# Patient Record
Sex: Female | Born: 1940 | Race: White | Hispanic: No | Marital: Married | Smoking: Never smoker
Health system: Southern US, Community
[De-identification: ages and names within clinical notes are randomized; demographics above are authoritative.]

## PROBLEM LIST (undated history)

## (undated) DIAGNOSIS — K219 Gastro-esophageal reflux disease without esophagitis: Secondary | ICD-10-CM

## (undated) DIAGNOSIS — E7800 Pure hypercholesterolemia, unspecified: Secondary | ICD-10-CM

## (undated) DIAGNOSIS — F419 Anxiety disorder, unspecified: Secondary | ICD-10-CM

## (undated) DIAGNOSIS — I10 Essential (primary) hypertension: Secondary | ICD-10-CM

## (undated) HISTORY — PX: CHOLECYSTECTOMY: SHX55

## (undated) HISTORY — PX: OTHER SURGICAL HISTORY: SHX169

## (undated) HISTORY — PX: BACK SURGERY: SHX140

---

## 2001-09-08 ENCOUNTER — Inpatient Hospital Stay (HOSPITAL_COMMUNITY): Admission: EM | Admit: 2001-09-08 | Discharge: 2001-09-09 | Payer: Self-pay | Admitting: Emergency Medicine

## 2001-09-08 ENCOUNTER — Encounter: Payer: Self-pay | Admitting: Emergency Medicine

## 2001-09-09 ENCOUNTER — Encounter: Payer: Self-pay | Admitting: *Deleted

## 2001-10-11 ENCOUNTER — Ambulatory Visit (HOSPITAL_COMMUNITY): Admission: RE | Admit: 2001-10-11 | Discharge: 2001-10-11 | Payer: Self-pay | Admitting: *Deleted

## 2001-10-11 ENCOUNTER — Encounter: Payer: Self-pay | Admitting: *Deleted

## 2002-03-08 ENCOUNTER — Encounter: Admission: RE | Admit: 2002-03-08 | Discharge: 2002-03-08 | Payer: Self-pay | Admitting: *Deleted

## 2002-05-06 ENCOUNTER — Encounter: Admission: RE | Admit: 2002-05-06 | Discharge: 2002-05-06 | Payer: Self-pay | Admitting: *Deleted

## 2002-09-17 ENCOUNTER — Encounter: Admission: RE | Admit: 2002-09-17 | Discharge: 2002-09-17 | Payer: Self-pay | Admitting: *Deleted

## 2002-10-14 ENCOUNTER — Encounter: Admission: RE | Admit: 2002-10-14 | Discharge: 2002-10-14 | Payer: Self-pay | Admitting: *Deleted

## 2003-03-28 ENCOUNTER — Encounter: Admission: RE | Admit: 2003-03-28 | Discharge: 2003-03-28 | Payer: Self-pay | Admitting: *Deleted

## 2004-10-25 ENCOUNTER — Ambulatory Visit: Payer: Self-pay | Admitting: *Deleted

## 2004-11-05 ENCOUNTER — Ambulatory Visit: Payer: Self-pay

## 2004-11-05 ENCOUNTER — Ambulatory Visit: Payer: Self-pay | Admitting: *Deleted

## 2004-12-10 ENCOUNTER — Ambulatory Visit: Payer: Self-pay | Admitting: *Deleted

## 2005-11-10 ENCOUNTER — Ambulatory Visit: Payer: Self-pay

## 2005-11-10 ENCOUNTER — Ambulatory Visit: Payer: Self-pay | Admitting: Cardiology

## 2006-09-23 ENCOUNTER — Ambulatory Visit: Payer: Self-pay | Admitting: Internal Medicine

## 2006-09-23 ENCOUNTER — Inpatient Hospital Stay (HOSPITAL_COMMUNITY): Admission: EM | Admit: 2006-09-23 | Discharge: 2006-09-27 | Payer: Self-pay | Admitting: Emergency Medicine

## 2006-11-13 ENCOUNTER — Ambulatory Visit (HOSPITAL_BASED_OUTPATIENT_CLINIC_OR_DEPARTMENT_OTHER): Admission: RE | Admit: 2006-11-13 | Discharge: 2006-11-13 | Payer: Self-pay | Admitting: Orthopedic Surgery

## 2006-12-07 ENCOUNTER — Ambulatory Visit: Payer: Self-pay

## 2006-12-07 ENCOUNTER — Ambulatory Visit: Payer: Self-pay | Admitting: Cardiology

## 2007-01-31 ENCOUNTER — Encounter: Payer: Self-pay | Admitting: Endocrinology

## 2007-11-21 ENCOUNTER — Ambulatory Visit: Payer: Self-pay

## 2008-01-14 ENCOUNTER — Ambulatory Visit: Payer: Self-pay | Admitting: Cardiology

## 2008-12-10 ENCOUNTER — Encounter: Payer: Self-pay | Admitting: Endocrinology

## 2009-03-17 ENCOUNTER — Encounter: Payer: Self-pay | Admitting: Endocrinology

## 2009-05-06 ENCOUNTER — Ambulatory Visit: Payer: Self-pay | Admitting: Endocrinology

## 2009-05-06 DIAGNOSIS — I10 Essential (primary) hypertension: Secondary | ICD-10-CM | POA: Insufficient documentation

## 2009-05-06 DIAGNOSIS — F329 Major depressive disorder, single episode, unspecified: Secondary | ICD-10-CM

## 2009-05-06 DIAGNOSIS — K219 Gastro-esophageal reflux disease without esophagitis: Secondary | ICD-10-CM | POA: Insufficient documentation

## 2009-05-06 DIAGNOSIS — F3289 Other specified depressive episodes: Secondary | ICD-10-CM | POA: Insufficient documentation

## 2009-05-06 DIAGNOSIS — E785 Hyperlipidemia, unspecified: Secondary | ICD-10-CM | POA: Insufficient documentation

## 2009-05-06 DIAGNOSIS — E0590 Thyrotoxicosis, unspecified without thyrotoxic crisis or storm: Secondary | ICD-10-CM | POA: Insufficient documentation

## 2009-06-17 ENCOUNTER — Ambulatory Visit: Payer: Self-pay | Admitting: Endocrinology

## 2009-06-17 DIAGNOSIS — E042 Nontoxic multinodular goiter: Secondary | ICD-10-CM | POA: Insufficient documentation

## 2009-08-11 ENCOUNTER — Ambulatory Visit: Payer: Self-pay | Admitting: Endocrinology

## 2009-08-12 LAB — CONVERTED CEMR LAB: Free T4: 0.6 ng/dL (ref 0.6–1.6)

## 2011-05-10 NOTE — Assessment & Plan Note (Signed)
[place] OFFICE NOTE   NAME:[name] [name], [name] [name]                     MRN:          [id number]  DATE:01/14/2008                            DOB:          06-19-1941    PRIMARY CARE PHYSICIAN:  Dewitt Hoes, MD   REASON FOR VISIT:  Cardiology followup.   HISTORY OF PRESENT ILLNESS:  [name] [name] comes in for a 1-year visit.  She  has been under significant stress over the last few months as her  husband has what sounds to be fairly significant myocardial infarction  back in November with subsequent ischemic cardiomyopathy.  He had a  prolonged hospital stay at [facility] and she is now starting to  experience less stress.  Over the last few months, she has had  intermittent palpitations and sharp chest pains that have been more  frequent.  Within the last 2 weeks, these symptoms have resolved  completely.  She continues on medicines as outlined below.  Today's  electrocardiogram shows normal sinus rhythm with a leftward axis and  nonspecific T-wave changes.  No marked changes in comparison to her  previous tracing from December 2007.  She had follow-up carotid Dopplers  done in November demonstrating some progression on the left with 60-79%  stenosis and on the right 0- 39% stenosis.  Follow-up was recommended in  6 months.  Lipids were followed by Dr. Keenan Bachelor.   ALLERGIES:  CODEINE AND MORPHINE.   PRESENT MEDICATIONS:  1. Fexofenadine 180 mg p.o. daily.  2. Atenolol 50 mg p.o. daily.  3. Wellbutrin 150 mg p.o. daily.  4. Crestor 20 mg p.o. daily.  5. Hydrochlorothiazide 25 mg p.o. daily.  6. Ranitidine 150 mg p.o. daily.  7. Syntest DS daily,  8. Alprazolam 0.5 mg p.o. daily.  9. Aspirin 81 mg p.o. daily.  10.Klor-Con 10 mEq  p.o. daily.  11.Tramadol 50 mg p.o. daily   REVIEW OF SYSTEMS:  As described above.  No dizziness or syncope.  No  frank exertional chest pain.  Otherwise negative.   PHYSICAL  EXAMINATION:  Blood pressure 143/77, heart rate is 76, weight  165 pounds. The patient is comfortable in no acute distress HEENT:  Conjunctivae was normal.  Oropharynx clear.  NECK:  Supple.  No elevated jugular venous pressure and no loud bruits.  No thyromegaly is noted.  Lungs are clear without labored breathing.  Cardiac exam reveals a regular rate and rhythm.  No S3 gallop or  pericardial rub.  ABDOMEN: Soft, nontender.  Normoactive bowel sounds.  EXTREMITIES:  No clubbing, cyanosis or edema.  Distal pulses 2+.  SKIN:  Warm and dry.  MUSCULOSKELETAL:  No kyphosis is noted.  NEUROPSYCHIATRIC:  The patient alert and oriented x3.  Affect is normal.   IMPRESSION/RECOMMENDATIONS:  1. History of paroxysmal atrial tachycardia and palpitations.  There      has been some flare in the symptoms of the last few months in the      setting of stress, although the patient states that she has felt      better over the last  few weeks.  I have asked her to be mindful of      any changes or progression in her symptomatology.  Otherwise, we      will plan to continue beta blocker therapy with follow-up over the      next 6 months.  2. Carotid artery disease with progression of the left.  Follow-up      carotid duplex in 6 months.  3. No clear history of significant obstructive coronary artery      disease.  I have asked [name] [name] that if she continues to      experience any episodes of chest pain to let us know and we may      consider a followup Myoview in that case.  She states that she had      one in [place] since her prior study here in 2005.     Satira Sark, MD  Electronically Signed    SGM/MedQ  DD: 01/14/2008  DT: 01/14/2008  Job #: [id number]   cc:   Dewitt Hoes

## 2011-05-13 NOTE — Cardiovascular Report (Signed)
[place]. [facility]  Patient:    MACKENZYE, OSTIGUY Visit Number: [id number] MRN: [id number]          Service Type: CAT Location: [facility] 2899 [id number] Attending Physician:  Allene Dillon Dictated by:   Allene Dillon, M.D. [facility] Proc. Date: 10/11/01 Admit Date:  10/11/2001 Discharge Date: 10/11/2001   CC:         Dewitt Hoes, M.D.             Cardiac Catheterization Laboratory                        Cardiac Catheterization  PROCEDURES PERFORMED: Left heart catheterization with coronary angiography and left ventriculography.  INDICATIONS: The patient is a 70 year old woman with multiple cardiovascular risk factors including severe hyperlipidemia and extremely positive family history of premature coronary artery disease. She has been having recurrent episodes of substernal chest pain. An adenosine Cardiolite done approximately one month ago showed no ischemia. However, because of the recurrent nature of her chest pain, and multiple risk factors as outlined above, we opted to proceed with cardiac catheterization to rule out coronary artery disease.  DESCRIPTION OF PROCEDURE: A 6 French sheath was placed in the right femoral artery. Standard Judkins 6 French catheters were utilized.  Contrast was Omnipaque.  There were no complications.  RESULTS:  HEMODYNAMICS: Left ventricular pressure 158/25. Aortic pressure 158/76.  There was no aortic valve gradient.  LEFT VENTRICULOGRAM: Wall motion is normal. Ejection fraction calculated at 58%. There is no mitral regurgitation.  CORONARY ARTERIOGRAPHY: (Right dominant).  Left main: Normal.  Left anterior descending: The left anterior descending artery has minor luminal irregularities in the mid vessel. It gives rise to a small first diagonal and normal sized second diagonal.  Left circumflex: The left circumflex has minor luminal irregularities in the distal vessel. It gives rise to a small OM-1, normal sized  OM-2 and a large OM-3.  Right coronary artery: The right coronary artery has a 20% stenosis in the proximal vessel. The right coronary artery is a dominant vessel giving rise to a normal sized posterior descending artery and a small posterolateral branch.  IMPRESSIONS: 1. Normal left ventricular systolic function. 2. No significant coronary artery disease identified.  The patients chest pain appears to be noncardiac. Dictated by:   Allene Dillon, M.D. [place] Attending Physician:  Allene Dillon DD:  10/11/01 TD:  10/12/01 Job: [id number]

## 2011-05-13 NOTE — Discharge Summary (Signed)
NAMESAIDIE, LAYER              ACCOUNT NO.:  [id number]   MEDICAL RECORD NO.:  [id number]          PATIENT TYPE:  INP   LOCATION:  [id number]                         FACILITY:  [facility]   PHYSICIAN:  Dionicio Stall, M.D.  DATE OF BIRTH:  1941/04/27   DATE OF ADMISSION:  09/23/2006  DATE OF DISCHARGE:  09/27/2006                                 DISCHARGE SUMMARY   DISCHARGE DIAGNOSES:  1. Left heel ulcer.  2. Hypokalemia.  3. Diarrhea, most likely secondary to antiviral antibiotics.  4. Hypertension.  5. Hyperthyroidism.  6. Gastroesophageal reflux disease.  7. Urinary retention, secondary to back surgery.  8. Anxiety/depression.  9. Allergies.  10.Insomnia.  11.Osteoarthritis.   DISCHARGE MEDICATIONS:  1. Augmentin 500 mg p.o. b.i.d. for 7 days.  2. Atenolol 50 mg p.o. daily.  3. Omeprazole 20 mg p.o. daily.  4. Crestor 20 mg p.o. daily.  5. Maxzide 37.5/25 mg p.o. daily.  6. Propylthiouracil 50 mg p.o. daily.  7. Wellbutrin 150 mg p.o. daily.  8. Fluoxetine 20 mg p.o. daily.  9. Fexofenadine 180 mg p.o. daily.  10.Alprazolam 0.5 mg p.o. daily.  11.Trazodone 25 mg q.h.s.  12.Syntest DS tab daily.  13.Dressing changes to be done twice a day, saline wet-to-dry dressings,      and to use a boot while walking.   FOLLOWUP:  1. Patient is to followup with Dr. Berenice Primas, orthopedic surgeon.  Patient to      call and make appointment.  2. Dr. Dewitt Hoes, patient's primary care physician in [place].      Patient to make appointment herself.   CONSULTANT THIS ADMISSION:  Dr. Berenice Primas, orthopedic surgeon.   IMAGES DONE THIS ADMISSION:  1. Ankle x-ray (left) on September 23, 2006.  2. MRI done on September 24, 2006.  3. Ultrasound, renal, done on September 27, 2006.   PROCEDURE DONE THIS ADMISSION:  Wound care and debridement, done by  Orthopedics.   BRIEF HISTORY AND PHYSICAL:  Please seen Medical Record for full details.  Ms. Kamrath is a 70 year old white woman with a history of  hypertension,  hyperthyroidism, hyperlipidemia, and other issues as mentioned below,  presenting with a left heel ulcer with redness and pain with injury 3 weeks  prior to admission, after receiving trauma from a storm door, causing a  laceration, which progressed to form an ulcer.  Active drainage present.  Received tetanus shot in the Emergency Department.   ALLERGIES:  Codeine and morphine.   PAST MEDICAL HISTORY:  1. Hypertension.  2. Hyperthyroidism.  3. Urinary retention, secondary to back surgery in 2000.  4. Hyperlipidemia.  5. GERD.  6. Osteoarthritis.  7. Anxiety.  8. Lumbar fusion done, L3-L4, in 2000.  9. Neck surgery done 25 years ago.  10.Coronary artery disease with a cardiac catheterization in October of      2002, showing normal left ventricular systolic function and no      significant coronary artery disease identified.   MEDICATIONS:  1. Atenolol 50 mg p.o. daily.  2. Prilosec daily.  3. Crestor 20 mg daily.  4. Hydrochlorothiazide 25 mg daily.  5. Propylthiouracil 15 mcg p.o. daily.  6. Wellbutrin 150 mg p.o. daily.  7. Fluoxetine 20 mg p.o. daily.  8. Fexofenadine 180 mg p.o. daily.  9. Omeprazole 20 mg p.o. daily.  10.Alprazolam 0.5 mg p.o. daily.  11.Syntest DS tablet daily.   SOCIAL HISTORY:  Patient is a former smoker.  Quit 25 years ago.  One pack  per day for 30 years.  No history of alcohol or drug abuse.   PHYSICAL EXAMINATION:  Temperature 97.2.  Blood pressure 110/67.  Respiratory rate of 22.  O2 sat is 96% on room air.  Weight 157.5.  General  appearance:  Patient did not appear in acute distress.  Eyes:  Pupils round,  equal, reactive to light.  Extraocular movements intact.  ENT:  Oropharynx:  Clear.  No edema or exudate.  Neck:  Supple, no adenopathy.  No bruits.  No  JVD.  Lungs:  Clear to auscultation bilaterally.  No wheezes or rhonchi.  Heart:  Regular rate, rhythm.  No murmurs, rubs or gallops.  Abdomen:  Soft,  nondistended,  nontender.  Bowel sounds present.  Extremities:  Left foot  ulcer, showing large 3x3 cm ulcer on back of left heel with no active  drainage or surrounding erythema and edema.  Skin:  Multiple ecchymoses  bilaterally on upper extremities.  Neurologically:  Alert and oriented x4,  grossly nonfocal.  Psyche:  Anxious.   LAB TESTS ON ADMISSION:  Sodium 133, potassium 3.2, chloride 97, bicarb 28,  BUN 16, creatinine 1.4, glucose of 89.  White cell count of 8.0, ANC 5.6,  hemoglobin 13.9, hematocrit 40.4, MCV 87.8 and platelets of 261.   HOSPITAL COURSE:  1. Left heel ulcer with surrounding cellulitis.  Patient was ruled out for      osteomyelitis with an x-ray, as well as an MRI of the lower      extremities.  Orthopedic consult was called.  Dr. Berenice Primas took part in      debridement of the ulcer and gave recommendations regarding wound care.      Patient was given the option to get I&D of wound and possible full      thickness skin graft versus more conservative approach.  Patient      decided on the conservative approach and Dr. Berenice Primas will be following      her closely as an outpatient.  Saline wet-to-dry dressing changes      b.i.d. were done during hospitalization and patient to continue the      same at home.  Patient was placed on broad-spectrum antibiotics      initially.  Wound cultures were negative, as well as blood cultures.      Hence, was switched to Augmentin, which patient is to continue on      discharge, as above, for 5 more days.  2. Hypertension.  Patient was continued on home medications of atenolol      and Maxzide.  3. Hypokalemia.  This is repeated during hospitalization and on discharge,      her potassium level was 3.8.  4. Hyperlipidemia.  Patient continued on Crestor.  5. Hyperthyroidism.  Patient continued on home doses of propylthiouracil.  6. History of GERD.  Patient kept on Protonix. 7. Urinary retention, secondary to back surgery.  Patient performed in-and-       out catheterizations by herself.  Urine culture was negative during      hospitalization.  8. Seasonal allergies.  Patient continued on Claritin.  9. Anxiety/depression.  Patient continued on Wellbutrin, fluoxetine and      Xanax.  10.Insomnia.  Patient continued on trazodone.  11.Osteoarthritis.  Patient continued on Tylenol and tramadol p.r.n.   DISCHARGE LABS:  Sodium 140, potassium 3.8, chloride 107, bicarb 28, glucose  84, BUN 7, creatinine 1.4 and calcium of 8.9.  Hemoglobin of 12.4,  hematocrit 36.9, MCV 89.0, white cell count of 5.5, platelets 204.   DISCHARGE VITALS:  Temperature 98.0.  Blood pressure 158/66.  Pulse 68.  Respiratory rate of 16.  O2 sat is 94% on room air.      Dionicio Stall, M.D.  Electronically Signed     SS/MEDQ  D:  10/04/2006  T:  10/05/2006  Job:  [id number]   cc:   Alta Corning, M.D.  Dewitt Hoes

## 2011-05-13 NOTE — Discharge Summary (Signed)
[place]. [facility]  Patient:    [name] [name], [name] [name] Visit Number: [id number] MRN: [id number]          Service Type: MED Location: 2000 2040 01 Attending Physician:  Allene Dillon Dictated by:   Mikey Bussing, P.A. Admit Date:  09/08/2001 Disc. Date: 09/09/01   CC:         Dewitt Hoes, M.D., [place], [place]   Discharge Summary  DATE OF BIRTH:  28-Mar-1941  HISTORY:  Ms. Kraynak is a 70 year old female, who presented to [facility] Emergency Room on 09/08/01 for evaluation of chest pain.  She does have a history of previous chest pain but no documented coronary artery disease.  She lives in [place] and wanted to come to [facility] for evaluation.  She was seen in the emergency room by Dr. Vicenta Aly.  She does have a very significant family history of coronary artery disease.  The patient reported that she had developed chest pain the evening prior to admission around 9 p.m. while watching television.  Her pain was epigastric, described as a pressure-like sensation or a weight on her chest.  It was associated with shortness of breath and nausea.  She reportedly appeared very pale.  There was no diaphoresis.  The patient took Xanax 0.5 mg x 3 with eventual relief of her pain when she fell asleep around midnight.  She said the patient was approximately 5 on a 1-10 scale.  The patient slept through the night.  When she awoke, she had more pain and tingling in her left arm. She was pain free when seen in the emergency room, although she reported feeling very tired.  She has had similar symptoms over the past several months.  She is fairly sedentary since she had back surgery last year.  PAST MEDICAL HISTORY:  Negative for diabetes mellitus.  Positive for hypertension, positive elevated cholesterol, positive obesity.  She quit tobacco 20 years ago.  She is exposed to secondhand smoke at this time. Her father died at age 62 from an MI.  Her  mother had an MI in her 54s.  She has multiple brothers and sisters with MIs and coronary artery bypass graft surgery.  She also has a son who had an MI.  She had back surgery approximately one year ago.  She is status post cholecystectomy and hysterectomy.  She had neck surgery as well as fusion and nasal surgery.  ALLERGIES:  CODEINE, MORPHINE, HYDROCODONE, METHOCARBAMOL.  SOCIAL HISTORY:  The patient is married.  She lives outside of [place].  She has two children.  She does not use alcohol.  There is a very remote tobacco history.  HOSPITAL COURSE:  The patient was admitted to [facility].  Her enzymes were found to be negative.  She was found to be hypokalemic, and this was supplemented.  The patient was scheduled for an adenosine Cardiolite which was performed on 09/09/01.  This showed no ischemia, no wall motion abnormalities.  She had a normal ejection fraction of 60%.  Arrangements were made to discharge her following the negative stress test.  The patient is concerned that she still may have coronary artery disease.  She was told to follow up with Dr. Vicenta Aly if she wanted to discuss further testing such as cardiac catheterization.  Otherwise, she is to follow up with Dr. Keenan Bachelor.  She was told to call him Monday for an appointment.  LABORATORY DATA:  On the day of discharge, her potassium  was 3.0.  It was supplemented prior to discharge, and she was told to take extra potassium after discharge.  A basic metabolic panel on the day of discharge revealed a potassium of 3 and as noted, her potassium was supplemented.  An EKG at the time of admission showed normal sinus rhythm with low voltage QRS and was interpreted as a borderline EKG.  A CBC on admission revealed hemoglobin 11, hematocrit 33, BUN 25, creatinine was initially 2.1 in an i-STAT 8.  A follow-up revealed BUN and creatinine to be within normal range.  The lab reports are currently not in the chart.   Cardiac enzymes were negative.  A CBC revealed hemoglobin 11.7, hematocrit 32.9, WBC 5.2, platelets 285,000.  A chest x-ray showed no active pulmonary disease.  DISCHARGE MEDICATIONS:  The patient was told to continue her home medications. These included Xanax 0.5 mg p.r.n.; Darvocet p.r.n.; Daypro 600 mg daily; Rhinocort nasal spray as previously used; atenolol 50 mg daily; Niaspan extended release 500 mg daily; Claritin as needed; Prilosec 20 mg daily.  It was recommended she increase this to 20 mg b.i.d.; Synthroid as taken previously; Estratest as taken previously; Prozac as taken previously.  It was recommended that she hold her hydrochlorothiazide x several times and that she increase her potassium to 20 mEq 2 tablets b.i.d. for several days.  Then she should take 2 tablets once daily.  These were 10 mEq tablets.  The patient to stay on low fat, low salt diet.  She was to have a potassium level at her next office visit.  She was told to call Monday for a follow-up appointment with Dr. Keenan Bachelor.  She was told to call Dr. Vicenta Aly for a follow-up appointment if she wanted to discuss further testing.  PROBLEM LIST AT DISCHARGE:  1. Chest pain, somewhat atypical.  2. Elevated lipids.  3. Obesity.  4. Hypertension.  5. Hypokalemia.  6. Strong family history of coronary artery disease.  7. Mild anemia.  8. Anxiety disorder.  9. Chronic back pain. 10. Gastroesophageal reflux disease. 11. Remote tobacco history.  ADDENDUM:  BUN on the day of discharge is 16, creatinine 1.7, sodium low at 134, potassium 3.  As noted, this was supplemented.  It might be prudent to discontinue this patients hydrochlorothiazide as she seems to run low potassium levels.  She had liver function tests that were within normal limits.  A lipid profile is pending at the time of this dictation. Dictated by:   Mikey Bussing, P.A. Attending Physician:  Allene Dillon DD:  09/09/01 TD:  09/09/01 Job:  [id number]

## 2011-05-13 NOTE — Assessment & Plan Note (Signed)
[place] OFFICE NOTE   NAME:[name] [name] ALBAUGH                     MRN:          [id number]  DATE:12/07/2006                            DOB:          1941-06-16    PRIMARY CARE PHYSICIAN:  Dewitt Hoes, M.D.   REASON FOR VISIT:  Followup atrial tachycardia and carotid artery  disease.   HISTORY OF PRESENT ILLNESS:  I saw [name] [name] back in November of 2006.  Since that time, she continues to describe no major problems with  recurrent palpitations, chest pain, or dyspnea on exertion.  She has had  no focal weakness or visual changes.  She had a followup carotid duplex  done earlier today, with preliminary report indicating 0-39% stenosis on  the right and 40-59% stenosis on the left, relatively stable.  Her  electrocardiogram showed sinus rhythm with a leftward axis and  nonspecific ST-T wave changes, as noted on the prior tracing.  Her  medications are unchanged and outlined below.   ALLERGIES:  CODEINE AND MORPHINE.   CURRENT MEDICATIONS:  1. Fexofenadine 180 mg p.o. daily.  2. Atenolol 50 mg p.o. daily.  3. Wellbutrin 150 mg p.o. daily.  4. Crestor 20 mg p.o. daily.  5. Hydrochlorothiazide 25 mg p.o. daily.  6. Ranitidine 150 mg p.o. daily.  7. Syntest DS daily.  8. Alprazolam 0.5 mg p.o. daily.  9. Aspirin 81 mg p.o. daily.  10.Klor-Con 10 mEq p.o. daily.  11.Tramadol 50 mg p.o. q.h.s.   REVIEW OF SYSTEMS:  As described in the history of present illness.  She  does state that she injured her left heel on a storm door and had to  have stitches. This has limited her ambulation somewhat.   PHYSICAL EXAMINATION:  VITAL SIGNS:  Blood pressure 134/83, heart rate  67.  Weight is 157 pounds which is relatively stable.  GENERAL:  The patient is comfortable and in no acute distress.  HEENT:  Conjunctivae and lids normal.  Oropharynx clear.  NECK:  Supple without elevated jugular venous pressure and  without  bruits.  No thyromegaly is noted.  LUNGS:  Clear without labored breathing at rest.  CARDIAC:  Regular rate and rhythm without loud murmur or gallop.  ABDOMEN:  Soft and nontender with no loud bruits.  EXTREMITIES:  So significant pitting edema.   IMPRESSION/RECOMMENDATIONS:  1. History of paroxysmal atrial tachycardia/palpitations.  This has      not been a major recurrent problem symptomatically, and the plan      will be to continue beta-blocker therapy.  2. Carotid artery disease, relatively stable in comparison to last      year's ultrasound.  Will plan a followup in 1 year's time.  3. History of hypertension and hyperlipidemia, followed by Dr. Keenan Bachelor.     Satira Sark, MD  Electronically Signed    SGM/MedQ  DD: 12/07/2006  DT: 12/07/2006  Job #: [id number]   cc:   Dewitt Hoes

## 2011-05-13 NOTE — Op Note (Signed)
[name], [name] [name]              ACCOUNT NO.:  [id number]   MEDICAL RECORD NO.:  [id number]          PATIENT TYPE:  AMB   LOCATION:  DSC                          FACILITY:  [facility]   PHYSICIAN:  Alta Corning, M.D.   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   PROCEDURE:  1. Irrigation and excisional debridement of chronically granulating      wound, left leg with debridement of skin, deep tissue, fascia and      peritenon.  2. Delayed wound closure.   SURGEON:  Graves.   ASSISTANT:  None.   ANESTHESIA:  General.   HISTORY:  [name] is a 70 year old female with a long history of  having had a screen door hit her on the back of her leg.  She ultimately  had a little wound over her Achilles which ultimately went on to break  down.  We saw her in the hospital and treated her.  She was being  treated for infection.  We got the wound under reasonable control.  We  treated it conservatively for a long period of time.  It was still  granulating and just not wanting to go on to heal and we talked about  treatment options.  I ultimately felt that if we removed a bunch of  granulation tissue and ellipsed the wound that we should be able to get  it to close.  We knew we would have to keep her non-weightbearing with  her toes down for a period of time to get that to heal.  She was  accepting of this and so she was taken to the operating room for this  procedure.   PROCEDURE:  The patient was taken to the operating room.  After adequate  anesthesia was obtained with general anesthetic, the patient was placed  supine on the operating table.  The left leg was then prepped and draped  in the usual sterile fashion.  Following this, a marking  pencil was used  to sort of extend the edges of this wound up and around the elliptical  edges.  We did a full excisional debridement of skin, deep fascia,  peritenon, and all this granulomatous-style tissue back to good healthy  skin edges.  Once this was accomplished, the wound was thoroughly and  copiously irrigated and then suctioned dry.  The skin was then closed  with five vertical mattress sutures with five simple sutures in between.  Really you could get her out to about five degrees of dorsiflexion  before there started to be significant pressure on the wound, but we are  going to treat her with her toes down for a couple of weeks.  She was  placed in a plantar flexion splint in gravity equinus and a sterile  compression dressing  was applied in this position.  The patient was taken to the  recovery  room and was noted to be in satisfactory condition.   ESTIMATED BLOOD LOSS:  For this procedure was none.      Alta Corning, M.D.  Electronically Signed     JLG/MEDQ  D:  11/13/2006  T:  11/14/2006  Job:  [id number]

## 2011-12-29 ENCOUNTER — Emergency Department (HOSPITAL_COMMUNITY): Payer: Medicare Other

## 2011-12-29 ENCOUNTER — Emergency Department (HOSPITAL_COMMUNITY)
Admission: EM | Admit: 2011-12-29 | Discharge: 2011-12-29 | Disposition: A | Discharge status: Home / Self Care | Payer: Medicare Other | Attending: Emergency Medicine | Admitting: Emergency Medicine

## 2011-12-29 ENCOUNTER — Encounter: Payer: Self-pay | Admitting: Emergency Medicine

## 2011-12-29 DIAGNOSIS — I10 Essential (primary) hypertension: Secondary | ICD-10-CM | POA: Insufficient documentation

## 2011-12-29 DIAGNOSIS — R220 Localized swelling, mass and lump, head: Secondary | ICD-10-CM | POA: Diagnosis not present

## 2011-12-29 DIAGNOSIS — E7800 Pure hypercholesterolemia, unspecified: Secondary | ICD-10-CM | POA: Insufficient documentation

## 2011-12-29 DIAGNOSIS — Z7982 Long term (current) use of aspirin: Secondary | ICD-10-CM | POA: Insufficient documentation

## 2011-12-29 DIAGNOSIS — F411 Generalized anxiety disorder: Secondary | ICD-10-CM | POA: Insufficient documentation

## 2011-12-29 DIAGNOSIS — Z981 Arthrodesis status: Secondary | ICD-10-CM | POA: Diagnosis not present

## 2011-12-29 DIAGNOSIS — S0003XA Contusion of scalp, initial encounter: Secondary | ICD-10-CM | POA: Insufficient documentation

## 2011-12-29 DIAGNOSIS — S0510XA Contusion of eyeball and orbital tissues, unspecified eye, initial encounter: Secondary | ICD-10-CM | POA: Insufficient documentation

## 2011-12-29 DIAGNOSIS — Z9181 History of falling: Secondary | ICD-10-CM | POA: Diagnosis not present

## 2011-12-29 DIAGNOSIS — W010XXA Fall on same level from slipping, tripping and stumbling without subsequent striking against object, initial encounter: Secondary | ICD-10-CM | POA: Insufficient documentation

## 2011-12-29 DIAGNOSIS — K089 Disorder of teeth and supporting structures, unspecified: Secondary | ICD-10-CM | POA: Diagnosis not present

## 2011-12-29 DIAGNOSIS — Z79899 Other long term (current) drug therapy: Secondary | ICD-10-CM | POA: Insufficient documentation

## 2011-12-29 DIAGNOSIS — S025XXA Fracture of tooth (traumatic), initial encounter for closed fracture: Secondary | ICD-10-CM | POA: Diagnosis not present

## 2011-12-29 DIAGNOSIS — T07XXXA Unspecified multiple injuries, initial encounter: Secondary | ICD-10-CM | POA: Diagnosis not present

## 2011-12-29 DIAGNOSIS — R221 Localized swelling, mass and lump, neck: Secondary | ICD-10-CM | POA: Insufficient documentation

## 2011-12-29 DIAGNOSIS — K219 Gastro-esophageal reflux disease without esophagitis: Secondary | ICD-10-CM | POA: Insufficient documentation

## 2011-12-29 DIAGNOSIS — K1370 Unspecified lesions of oral mucosa: Secondary | ICD-10-CM | POA: Insufficient documentation

## 2011-12-29 DIAGNOSIS — S1093XA Contusion of unspecified part of neck, initial encounter: Secondary | ICD-10-CM | POA: Insufficient documentation

## 2011-12-29 DIAGNOSIS — S0993XA Unspecified injury of face, initial encounter: Secondary | ICD-10-CM

## 2011-12-29 HISTORY — DX: Essential (primary) hypertension: I10

## 2011-12-29 HISTORY — DX: Pure hypercholesterolemia, unspecified: E78.00

## 2011-12-29 HISTORY — DX: Gastro-esophageal reflux disease without esophagitis: K21.9

## 2011-12-29 HISTORY — DX: Anxiety disorder, unspecified: F41.9

## 2011-12-29 LAB — BASIC METABOLIC PANEL
CO2: 23 mEq/L (ref 19–32)
Chloride: 102 mEq/L (ref 96–112)
Creatinine, Ser: 1.37 mg/dL — ABNORMAL HIGH (ref 0.50–1.10)
Glucose, Bld: 95 mg/dL (ref 70–99)

## 2011-12-29 LAB — DIFFERENTIAL
Basophils Absolute: 0 10*3/uL (ref 0.0–0.1)
Lymphocytes Relative: 27 % (ref 12–46)
Lymphs Abs: 2.2 10*3/uL (ref 0.7–4.0)
Monocytes Absolute: 0.7 10*3/uL (ref 0.1–1.0)
Monocytes Relative: 9 % (ref 3–12)
Neutro Abs: 5 10*3/uL (ref 1.7–7.7)

## 2011-12-29 LAB — CBC
HCT: 36.3 % (ref 36.0–46.0)
Hemoglobin: 12.7 g/dL (ref 12.0–15.0)
RBC: 4.16 MIL/uL (ref 3.87–5.11)
WBC: 8.2 10*3/uL (ref 4.0–10.5)

## 2011-12-29 MED ORDER — TRAMADOL HCL 50 MG PO TABS: 50.0000 mg | ORAL_TABLET | Freq: Four times a day (QID) | ORAL | Status: AC | PRN

## 2011-12-29 MED ORDER — ACETAMINOPHEN 325 MG PO TABS
650.0000 mg | ORAL_TABLET | Freq: Once | ORAL | Status: AC
  Administered 2011-12-29: 650 mg via ORAL
  Filled 2011-12-29: qty 2

## 2011-12-29 MED ORDER — TETANUS-DIPHTHERIA TOXOIDS TD 5-2 LFU IM INJ
0.5000 mL | INJECTION | Freq: Once | INTRAMUSCULAR | Status: AC
  Administered 2011-12-29: 0.5 mL via INTRAMUSCULAR
  Filled 2011-12-29: qty 0.5

## 2011-12-29 NOTE — ED Notes (Signed)
PT. TRIPPED AND FELL AT HOME THIS EVENING , HIT FACE ON THE FLOOR , NO LOC , AMBULATORY , PRESENTS WITH NASAL SWELLING , LEFT UPPER INCISOR MISSING AND  MOUTH PAIN .

## 2011-12-29 NOTE — ED Provider Notes (Signed)
History     CSN: [id number]  Arrival date & time 12/29/11  0355   First MD Initiated Contact with Patient 12/29/11 0533      Chief Complaint  Patient presents with  . Fall    (Consider location/radiation/quality/duration/timing/severity/associated sxs/prior treatment) Patient is a 71 y.o. female presenting with fall. The history is provided by the patient.  Fall The accident occurred 3 to 5 hours ago. The fall occurred while walking. Distance fallen: Ground level fall while ambulating. Impact surface: Kitchen floor. The volume of blood lost was minimal. The point of impact was the head. The pain is moderate. She was ambulatory at the scene. There was no entrapment after the fall. There was no alcohol use involved in the accident. Pertinent negatives include no fever, no abdominal pain, no nausea, no vomiting, no headaches and no loss of consciousness. Exacerbated by: Palpation of interest face. She has tried nothing for the symptoms.   Patient up last night to feed her dog, she tripped on the kitchen floor and fell onto her face. She lost her left medial incisor and did not recover. She also complains of a chipped lateral incisor on the left side. No LOC. No neck pain. No weakness or numbness. She had some nosebleeds which resolve also has some facial swelling around her nose and eyes. Pain sharp in quality. Pain not radiating. Patient does have a dentist that she sees in [place].   Past Medical History  Diagnosis Date  . Hypertension   . Hypercholesteremia   . GERD (gastroesophageal reflux disease)   . Anxiety     Past Surgical History  Procedure Date  . Back surgery   . Neck fusion     No family history on file.  History  Substance Use Topics  . Smoking status: Never Smoker   . Smokeless tobacco: Not on file  . Alcohol Use: No    OB History    Grav Para Term Preterm Abortions TAB SAB Ect Mult Living                  Review of Systems  Constitutional: Negative for  fever and chills.  HENT: Negative for neck pain and neck stiffness.   Eyes: Negative for pain.  Respiratory: Negative for shortness of breath.   Cardiovascular: Negative for chest pain, palpitations and leg swelling.  Gastrointestinal: Negative for nausea, vomiting and abdominal pain.  Genitourinary: Negative for dysuria.  Musculoskeletal: Negative for back pain.  Skin: Negative for rash.  Neurological: Negative for loss of consciousness and headaches.  All other systems reviewed and are negative.    Allergies  Codeine; Darvocet; Hydrocodone; Morphine; Pravachol; Sulfa antibiotics; and Zocor  Home Medications   Current Outpatient Rx  Name Route Sig Dispense Refill  . ALPRAZOLAM 0.5 MG PO TABS Oral Take 0.5 mg by mouth at bedtime as needed.      . ASPIRIN EC 81 MG PO TBEC Oral Take 81 mg by mouth daily.      . ATENOLOL 50 MG PO TABS Oral Take 50 mg by mouth daily.      . ATORVASTATIN CALCIUM 20 MG PO TABS Oral Take 20 mg by mouth daily.      . ERGOCALCIFEROL 50000 UNITS PO CAPS Oral Take 50,000 Units by mouth once a week. On Monday     . EST ESTROGENS-METHYLTEST 1.25-2.5 MG PO TABS Oral Take 1 tablet by mouth daily.      Marland Kitchen FEXOFENADINE HCL 180 MG PO TABS Oral Take  180 mg by mouth daily.      Marland Kitchen FLUOXETINE HCL 20 MG PO CAPS Oral Take 20 mg by mouth daily.      Marland Kitchen HYDROCHLOROTHIAZIDE 25 MG PO TABS Oral Take 25 mg by mouth daily.      Marland Kitchen NITROFURANTOIN MONOHYD MACRO 100 MG PO CAPS Oral Take 100 mg by mouth at bedtime.      . OMEPRAZOLE 20 MG PO CPDR Oral Take 20 mg by mouth daily.      Marland Kitchen VITAMIN B-12 1000 MCG PO TABS Oral Take 1,000 mcg by mouth daily.        BP 156/78  Pulse 90  Temp(Src) 97.6 F (36.4 C) (Oral)  Resp 18  SpO2 96%  Physical Exam  Constitutional: She is oriented to person, place, and time. She appears well-developed and well-nourished.  HENT:  Head: Normocephalic and atraumatic.       Patient has swelling and ecchymosis over the bridge of her nose and  periorbital areas. No active bleeding. No epistaxis or septal hematoma. No midface instability. She is missing her left upper medial incisor. She has a Lissa Merlin 1 fracture to her left upper lateral incisor. She does have tender dentition but no other loose teeth.  Eyes: Conjunctivae and EOM are normal. Pupils are equal, round, and reactive to light.  Neck: Full passive range of motion without pain. Neck supple.       No midline cervical tenderness or deformity.  Cardiovascular: Normal rate, regular rhythm, S1 normal, S2 normal and intact distal pulses.   Pulmonary/Chest: Effort normal and breath sounds normal.  Abdominal: Soft. Bowel sounds are normal. There is no tenderness. There is no rebound, no guarding and no CVA tenderness.  Musculoskeletal: Normal range of motion.  Neurological: She is alert and oriented to person, place, and time. She has normal strength and normal reflexes. No cranial nerve deficit or sensory deficit. She displays a negative Romberg sign. GCS eye subscore is 4. GCS verbal subscore is 5. GCS motor subscore is 6.       Normal Gait  Skin: Skin is warm and dry. No rash noted. No cyanosis. Nails show no clubbing.  Psychiatric: She has a normal mood and affect. Her speech is normal and behavior is normal.    ED Course  Procedures (including critical care time)  Labs Reviewed  BASIC METABOLIC PANEL - Abnormal; Notable for the following:    Potassium 2.8 (*)    Creatinine, Ser 1.37 (*)    GFR calc non Af Amer 38 (*)    GFR calc Af Amer 44 (*)    All other components within normal limits  CBC  DIFFERENTIAL   Ct Head Wo Contrast  12/29/2011  *RADIOLOGY REPORT*  Clinical Data:  The patient tripped and fell, hitting face on the floor.  Nasal swelling.  Left upper incisor is missing. Mouth pain.  CT HEAD WITHOUT CONTRAST CT MAXILLOFACIAL WITHOUT CONTRAST CT CERVICAL SPINE WITHOUT CONTRAST  Technique:  Multidetector CT imaging of the head, cervical spine, and maxillofacial  structures were performed using the standard protocol without intravenous contrast. Multiplanar CT image reconstructions of the cervical spine and maxillofacial structures were also generated.  Comparison:  None  CT HEAD  Findings: Diffuse cerebral atrophy.  Ventricular dilatation consistent with central atrophy.  Low attenuation change in the deep white matter suggesting small vessel ischemic change.  No mass effect or midline shift.  No abnormal extra-axial fluid collections.  Gray-white matter junctions are distinct.  Basal cisterns  are not effaced.  No evidence of acute intracranial hemorrhage.  No depressed skull fractures.  Vascular calcifications.  IMPRESSION: Chronic atrophy and small vessel ischemic change.  No evidence of acute intracranial hemorrhage, mass lesion, or acute infarct.  CT MAXILLOFACIAL  Findings:  There are air-fluid levels in both maxillary antra with mucosal membrane thickening also in the maxillary antra and sphenoid sinuses.  Diffuse opacification of the ethmoid air cells. Changes may represent underlying inflammatory process versus acute post-traumatic fluid.  Soft tissue swelling over the inferior aspect of the nose.  The nasal bones and nasal septum appear intact.  The globes and extraocular muscles are intact and symmetrical.  The orbital rims, maxillary antral walls, pterygoid plates, zygomatic arches, mandibles, and temporomandibular joints appear intact.  No depressed fractures identified.  There is degenerative change in the right temporomandibular joint with flattening and sclerosis of the mandibular head.  The left central upper incisor appears to be broken off.  Visualization of the mandibular area is limited due to streak artifact from dental work. There appear to be molar extraction as bilaterally.  IMPRESSION: Opacification of ethmoid air cells.  Mucosal membrane thickening and air-fluid level in the maxillary antra.  No displaced fractures are identified in these changes  might be due to preexisting sinus disease.  No depressed orbital or facial fractures identified. Fracture of the left upper central incisor.  CT CERVICAL SPINE  Findings:   Normal alignment of the cervical vertebrae and facet joints.  Degenerative changes throughout the facet joints. Narrowed cervical interspaces and endplate hypertrophic degenerative changes at C4-5, C5-6, and C6-7 levels.  Congenital fusion of C3-C4.  Degenerative changes at C1-2.  No vertebral compression deformities.  No prevertebral soft tissue swelling. Moderate posterior disc osteophyte complexes at C4-5, C5-6, and C6- 7 levels.  Lateral masses of C1 are symmetrical.  The odontoid process appears intact.  No paraspinal soft tissue infiltration. Visualized cervical lymph nodes are not pathologically enlarged.  IMPRESSION: Degenerative changes in the cervical spine.  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  No displaced fractures identified.  Original Report Authenticated By: Neale Burly, M.D.   Ct Cervical Spine Wo Contrast  12/29/2011  *RADIOLOGY REPORT*  Clinical Data:  The patient tripped and fell, hitting face on the floor.  Nasal swelling.  Left upper incisor is missing. Mouth pain.  CT HEAD WITHOUT CONTRAST CT MAXILLOFACIAL WITHOUT CONTRAST CT CERVICAL SPINE WITHOUT CONTRAST  Technique:  Multidetector CT imaging of the head, cervical spine, and maxillofacial structures were performed using the standard protocol without intravenous contrast. Multiplanar CT image reconstructions of the cervical spine and maxillofacial structures were also generated.  Comparison:  None  CT HEAD  Findings: Diffuse cerebral atrophy.  Ventricular dilatation consistent with central atrophy.  Low attenuation change in the deep white matter suggesting small vessel ischemic change.  No mass effect or midline shift.  No abnormal extra-axial fluid collections.  Gray-white matter junctions are distinct.  Basal cisterns are not effaced.  No evidence of acute  intracranial hemorrhage.  No depressed skull fractures.  Vascular calcifications.  IMPRESSION: Chronic atrophy and small vessel ischemic change.  No evidence of acute intracranial hemorrhage, mass lesion, or acute infarct.  CT MAXILLOFACIAL  Findings:  There are air-fluid levels in both maxillary antra with mucosal membrane thickening also in the maxillary antra and sphenoid sinuses.  Diffuse opacification of the ethmoid air cells. Changes may represent underlying inflammatory process versus acute post-traumatic fluid.  Soft tissue swelling over the inferior aspect of the  nose.  The nasal bones and nasal septum appear intact.  The globes and extraocular muscles are intact and symmetrical.  The orbital rims, maxillary antral walls, pterygoid plates, zygomatic arches, mandibles, and temporomandibular joints appear intact.  No depressed fractures identified.  There is degenerative change in the right temporomandibular joint with flattening and sclerosis of the mandibular head.  The left central upper incisor appears to be broken off.  Visualization of the mandibular area is limited due to streak artifact from dental work. There appear to be molar extraction as bilaterally.  IMPRESSION: Opacification of ethmoid air cells.  Mucosal membrane thickening and air-fluid level in the maxillary antra.  No displaced fractures are identified in these changes might be due to preexisting sinus disease.  No depressed orbital or facial fractures identified. Fracture of the left upper central incisor.  CT CERVICAL SPINE  Findings:   Normal alignment of the cervical vertebrae and facet joints.  Degenerative changes throughout the facet joints. Narrowed cervical interspaces and endplate hypertrophic degenerative changes at C4-5, C5-6, and C6-7 levels.  Congenital fusion of C3-C4.  Degenerative changes at C1-2.  No vertebral compression deformities.  No prevertebral soft tissue swelling. Moderate posterior disc osteophyte complexes at  C4-5, C5-6, and C6- 7 levels.  Lateral masses of C1 are symmetrical.  The odontoid process appears intact.  No paraspinal soft tissue infiltration. Visualized cervical lymph nodes are not pathologically enlarged.  IMPRESSION: Degenerative changes in the cervical spine.  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  No displaced fractures identified.  Original Report Authenticated By: Neale Burly, M.D.   Ct Maxillofacial Wo Cm  12/29/2011  *RADIOLOGY REPORT*  Clinical Data:  The patient tripped and fell, hitting face on the floor.  Nasal swelling.  Left upper incisor is missing. Mouth pain.  CT HEAD WITHOUT CONTRAST CT MAXILLOFACIAL WITHOUT CONTRAST CT CERVICAL SPINE WITHOUT CONTRAST  Technique:  Multidetector CT imaging of the head, cervical spine, and maxillofacial structures were performed using the standard protocol without intravenous contrast. Multiplanar CT image reconstructions of the cervical spine and maxillofacial structures were also generated.  Comparison:  None  CT HEAD  Findings: Diffuse cerebral atrophy.  Ventricular dilatation consistent with central atrophy.  Low attenuation change in the deep white matter suggesting small vessel ischemic change.  No mass effect or midline shift.  No abnormal extra-axial fluid collections.  Gray-white matter junctions are distinct.  Basal cisterns are not effaced.  No evidence of acute intracranial hemorrhage.  No depressed skull fractures.  Vascular calcifications.  IMPRESSION: Chronic atrophy and small vessel ischemic change.  No evidence of acute intracranial hemorrhage, mass lesion, or acute infarct.  CT MAXILLOFACIAL  Findings:  There are air-fluid levels in both maxillary antra with mucosal membrane thickening also in the maxillary antra and sphenoid sinuses.  Diffuse opacification of the ethmoid air cells. Changes may represent underlying inflammatory process versus acute post-traumatic fluid.  Soft tissue swelling over the inferior aspect of the nose.  The  nasal bones and nasal septum appear intact.  The globes and extraocular muscles are intact and symmetrical.  The orbital rims, maxillary antral walls, pterygoid plates, zygomatic arches, mandibles, and temporomandibular joints appear intact.  No depressed fractures identified.  There is degenerative change in the right temporomandibular joint with flattening and sclerosis of the mandibular head.  The left central upper incisor appears to be broken off.  Visualization of the mandibular area is limited due to streak artifact from dental work. There appear to be molar extraction as bilaterally.  IMPRESSION: Opacification of ethmoid air cells.  Mucosal membrane thickening and air-fluid level in the maxillary antra.  No displaced fractures are identified in these changes might be due to preexisting sinus disease.  No depressed orbital or facial fractures identified. Fracture of the left upper central incisor.  CT CERVICAL SPINE  Findings:   Normal alignment of the cervical vertebrae and facet joints.  Degenerative changes throughout the facet joints. Narrowed cervical interspaces and endplate hypertrophic degenerative changes at C4-5, C5-6, and C6-7 levels.  Congenital fusion of C3-C4.  Degenerative changes at C1-2.  No vertebral compression deformities.  No prevertebral soft tissue swelling. Moderate posterior disc osteophyte complexes at C4-5, C5-6, and C6- 7 levels.  Lateral masses of C1 are symmetrical.  The odontoid process appears intact.  No paraspinal soft tissue infiltration. Visualized cervical lymph nodes are not pathologically enlarged.  IMPRESSION: Degenerative changes in the cervical spine.  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  No displaced fractures identified.  Original Report Authenticated By: Neale Burly, M.D.    Patient initially declines pain medications. CT scans obtained and reviewed as above. For dental injury she does have a dentist and agrees to close followup.   MDM   Facial  trauma CT scan as above. She does have dental trauma but no tooth that can be replaced at this time. No neuro deficits. No other injuries. No syncope. Patient stable for discharge home to followup with her doctor and her dentist.        Teressa Lower, MD 12/29/11 [phone]

## 2011-12-29 NOTE — ED Notes (Signed)
Patient transported to CT 

## 2011-12-29 NOTE — ED Notes (Signed)
Family at bedside. 

## 2011-12-30 DIAGNOSIS — J209 Acute bronchitis, unspecified: Secondary | ICD-10-CM | POA: Diagnosis not present

## 2011-12-30 DIAGNOSIS — D518 Other vitamin B12 deficiency anemias: Secondary | ICD-10-CM | POA: Diagnosis not present

## 2011-12-30 DIAGNOSIS — I10 Essential (primary) hypertension: Secondary | ICD-10-CM | POA: Diagnosis not present

## 2012-01-11 DIAGNOSIS — R112 Nausea with vomiting, unspecified: Secondary | ICD-10-CM | POA: Diagnosis not present

## 2012-01-11 DIAGNOSIS — I10 Essential (primary) hypertension: Secondary | ICD-10-CM | POA: Diagnosis not present

## 2012-01-11 DIAGNOSIS — R42 Dizziness and giddiness: Secondary | ICD-10-CM | POA: Diagnosis not present

## 2012-01-12 DIAGNOSIS — J449 Chronic obstructive pulmonary disease, unspecified: Secondary | ICD-10-CM | POA: Diagnosis not present

## 2012-01-17 DIAGNOSIS — R269 Unspecified abnormalities of gait and mobility: Secondary | ICD-10-CM | POA: Diagnosis not present

## 2012-01-17 DIAGNOSIS — D529 Folate deficiency anemia, unspecified: Secondary | ICD-10-CM | POA: Diagnosis not present

## 2012-01-17 DIAGNOSIS — R413 Other amnesia: Secondary | ICD-10-CM | POA: Diagnosis not present

## 2012-01-17 DIAGNOSIS — D518 Other vitamin B12 deficiency anemias: Secondary | ICD-10-CM | POA: Diagnosis not present

## 2012-01-17 DIAGNOSIS — R42 Dizziness and giddiness: Secondary | ICD-10-CM | POA: Diagnosis not present

## 2012-01-17 DIAGNOSIS — I10 Essential (primary) hypertension: Secondary | ICD-10-CM | POA: Diagnosis not present

## 2012-01-17 DIAGNOSIS — E559 Vitamin D deficiency, unspecified: Secondary | ICD-10-CM | POA: Diagnosis not present

## 2012-01-20 DIAGNOSIS — N189 Chronic kidney disease, unspecified: Secondary | ICD-10-CM | POA: Diagnosis not present

## 2012-01-20 DIAGNOSIS — I129 Hypertensive chronic kidney disease with stage 1 through stage 4 chronic kidney disease, or unspecified chronic kidney disease: Secondary | ICD-10-CM | POA: Diagnosis not present

## 2012-01-20 DIAGNOSIS — R269 Unspecified abnormalities of gait and mobility: Secondary | ICD-10-CM | POA: Diagnosis not present

## 2012-01-20 DIAGNOSIS — E119 Type 2 diabetes mellitus without complications: Secondary | ICD-10-CM | POA: Diagnosis not present

## 2012-01-20 DIAGNOSIS — IMO0001 Reserved for inherently not codable concepts without codable children: Secondary | ICD-10-CM | POA: Diagnosis not present

## 2012-01-23 DIAGNOSIS — N189 Chronic kidney disease, unspecified: Secondary | ICD-10-CM | POA: Diagnosis not present

## 2012-01-23 DIAGNOSIS — E119 Type 2 diabetes mellitus without complications: Secondary | ICD-10-CM | POA: Diagnosis not present

## 2012-01-23 DIAGNOSIS — I129 Hypertensive chronic kidney disease with stage 1 through stage 4 chronic kidney disease, or unspecified chronic kidney disease: Secondary | ICD-10-CM | POA: Diagnosis not present

## 2012-01-23 DIAGNOSIS — R269 Unspecified abnormalities of gait and mobility: Secondary | ICD-10-CM | POA: Diagnosis not present

## 2012-01-23 DIAGNOSIS — IMO0001 Reserved for inherently not codable concepts without codable children: Secondary | ICD-10-CM | POA: Diagnosis not present

## 2012-01-25 DIAGNOSIS — R269 Unspecified abnormalities of gait and mobility: Secondary | ICD-10-CM | POA: Diagnosis not present

## 2012-01-25 DIAGNOSIS — IMO0001 Reserved for inherently not codable concepts without codable children: Secondary | ICD-10-CM | POA: Diagnosis not present

## 2012-01-25 DIAGNOSIS — E119 Type 2 diabetes mellitus without complications: Secondary | ICD-10-CM | POA: Diagnosis not present

## 2012-01-25 DIAGNOSIS — I129 Hypertensive chronic kidney disease with stage 1 through stage 4 chronic kidney disease, or unspecified chronic kidney disease: Secondary | ICD-10-CM | POA: Diagnosis not present

## 2012-01-25 DIAGNOSIS — N189 Chronic kidney disease, unspecified: Secondary | ICD-10-CM | POA: Diagnosis not present

## 2012-01-26 DIAGNOSIS — IMO0001 Reserved for inherently not codable concepts without codable children: Secondary | ICD-10-CM | POA: Diagnosis not present

## 2012-01-26 DIAGNOSIS — N189 Chronic kidney disease, unspecified: Secondary | ICD-10-CM | POA: Diagnosis not present

## 2012-01-26 DIAGNOSIS — E119 Type 2 diabetes mellitus without complications: Secondary | ICD-10-CM | POA: Diagnosis not present

## 2012-01-26 DIAGNOSIS — R269 Unspecified abnormalities of gait and mobility: Secondary | ICD-10-CM | POA: Diagnosis not present

## 2012-01-26 DIAGNOSIS — I129 Hypertensive chronic kidney disease with stage 1 through stage 4 chronic kidney disease, or unspecified chronic kidney disease: Secondary | ICD-10-CM | POA: Diagnosis not present

## 2012-01-30 DIAGNOSIS — N189 Chronic kidney disease, unspecified: Secondary | ICD-10-CM | POA: Diagnosis not present

## 2012-01-30 DIAGNOSIS — E119 Type 2 diabetes mellitus without complications: Secondary | ICD-10-CM | POA: Diagnosis not present

## 2012-01-30 DIAGNOSIS — IMO0001 Reserved for inherently not codable concepts without codable children: Secondary | ICD-10-CM | POA: Diagnosis not present

## 2012-01-30 DIAGNOSIS — I129 Hypertensive chronic kidney disease with stage 1 through stage 4 chronic kidney disease, or unspecified chronic kidney disease: Secondary | ICD-10-CM | POA: Diagnosis not present

## 2012-01-30 DIAGNOSIS — R269 Unspecified abnormalities of gait and mobility: Secondary | ICD-10-CM | POA: Diagnosis not present

## 2012-02-01 DIAGNOSIS — N189 Chronic kidney disease, unspecified: Secondary | ICD-10-CM | POA: Diagnosis not present

## 2012-02-01 DIAGNOSIS — I129 Hypertensive chronic kidney disease with stage 1 through stage 4 chronic kidney disease, or unspecified chronic kidney disease: Secondary | ICD-10-CM | POA: Diagnosis not present

## 2012-02-01 DIAGNOSIS — E119 Type 2 diabetes mellitus without complications: Secondary | ICD-10-CM | POA: Diagnosis not present

## 2012-02-01 DIAGNOSIS — R269 Unspecified abnormalities of gait and mobility: Secondary | ICD-10-CM | POA: Diagnosis not present

## 2012-02-01 DIAGNOSIS — IMO0001 Reserved for inherently not codable concepts without codable children: Secondary | ICD-10-CM | POA: Diagnosis not present

## 2012-02-02 DIAGNOSIS — I129 Hypertensive chronic kidney disease with stage 1 through stage 4 chronic kidney disease, or unspecified chronic kidney disease: Secondary | ICD-10-CM | POA: Diagnosis not present

## 2012-02-02 DIAGNOSIS — E119 Type 2 diabetes mellitus without complications: Secondary | ICD-10-CM | POA: Diagnosis not present

## 2012-02-02 DIAGNOSIS — R269 Unspecified abnormalities of gait and mobility: Secondary | ICD-10-CM | POA: Diagnosis not present

## 2012-02-02 DIAGNOSIS — N189 Chronic kidney disease, unspecified: Secondary | ICD-10-CM | POA: Diagnosis not present

## 2012-02-02 DIAGNOSIS — IMO0001 Reserved for inherently not codable concepts without codable children: Secondary | ICD-10-CM | POA: Diagnosis not present

## 2012-02-06 DIAGNOSIS — I129 Hypertensive chronic kidney disease with stage 1 through stage 4 chronic kidney disease, or unspecified chronic kidney disease: Secondary | ICD-10-CM | POA: Diagnosis not present

## 2012-02-06 DIAGNOSIS — N189 Chronic kidney disease, unspecified: Secondary | ICD-10-CM | POA: Diagnosis not present

## 2012-02-06 DIAGNOSIS — IMO0001 Reserved for inherently not codable concepts without codable children: Secondary | ICD-10-CM | POA: Diagnosis not present

## 2012-02-06 DIAGNOSIS — R269 Unspecified abnormalities of gait and mobility: Secondary | ICD-10-CM | POA: Diagnosis not present

## 2012-02-06 DIAGNOSIS — E119 Type 2 diabetes mellitus without complications: Secondary | ICD-10-CM | POA: Diagnosis not present

## 2012-02-07 DIAGNOSIS — R0989 Other specified symptoms and signs involving the circulatory and respiratory systems: Secondary | ICD-10-CM | POA: Diagnosis not present

## 2012-02-07 DIAGNOSIS — R5381 Other malaise: Secondary | ICD-10-CM | POA: Diagnosis not present

## 2012-02-07 DIAGNOSIS — G4710 Hypersomnia, unspecified: Secondary | ICD-10-CM | POA: Diagnosis not present

## 2012-02-07 DIAGNOSIS — E559 Vitamin D deficiency, unspecified: Secondary | ICD-10-CM | POA: Diagnosis not present

## 2012-02-07 DIAGNOSIS — R0609 Other forms of dyspnea: Secondary | ICD-10-CM | POA: Diagnosis not present

## 2012-02-07 DIAGNOSIS — J309 Allergic rhinitis, unspecified: Secondary | ICD-10-CM | POA: Diagnosis not present

## 2012-02-08 DIAGNOSIS — I129 Hypertensive chronic kidney disease with stage 1 through stage 4 chronic kidney disease, or unspecified chronic kidney disease: Secondary | ICD-10-CM | POA: Diagnosis not present

## 2012-02-08 DIAGNOSIS — R269 Unspecified abnormalities of gait and mobility: Secondary | ICD-10-CM | POA: Diagnosis not present

## 2012-02-08 DIAGNOSIS — IMO0001 Reserved for inherently not codable concepts without codable children: Secondary | ICD-10-CM | POA: Diagnosis not present

## 2012-02-08 DIAGNOSIS — N189 Chronic kidney disease, unspecified: Secondary | ICD-10-CM | POA: Diagnosis not present

## 2012-02-08 DIAGNOSIS — E119 Type 2 diabetes mellitus without complications: Secondary | ICD-10-CM | POA: Diagnosis not present

## 2012-02-09 DIAGNOSIS — IMO0001 Reserved for inherently not codable concepts without codable children: Secondary | ICD-10-CM | POA: Diagnosis not present

## 2012-02-09 DIAGNOSIS — R269 Unspecified abnormalities of gait and mobility: Secondary | ICD-10-CM | POA: Diagnosis not present

## 2012-02-09 DIAGNOSIS — I129 Hypertensive chronic kidney disease with stage 1 through stage 4 chronic kidney disease, or unspecified chronic kidney disease: Secondary | ICD-10-CM | POA: Diagnosis not present

## 2012-02-09 DIAGNOSIS — E119 Type 2 diabetes mellitus without complications: Secondary | ICD-10-CM | POA: Diagnosis not present

## 2012-02-09 DIAGNOSIS — N189 Chronic kidney disease, unspecified: Secondary | ICD-10-CM | POA: Diagnosis not present

## 2012-02-13 DIAGNOSIS — R269 Unspecified abnormalities of gait and mobility: Secondary | ICD-10-CM | POA: Diagnosis not present

## 2012-02-13 DIAGNOSIS — E119 Type 2 diabetes mellitus without complications: Secondary | ICD-10-CM | POA: Diagnosis not present

## 2012-02-13 DIAGNOSIS — N189 Chronic kidney disease, unspecified: Secondary | ICD-10-CM | POA: Diagnosis not present

## 2012-02-13 DIAGNOSIS — IMO0001 Reserved for inherently not codable concepts without codable children: Secondary | ICD-10-CM | POA: Diagnosis not present

## 2012-02-13 DIAGNOSIS — I129 Hypertensive chronic kidney disease with stage 1 through stage 4 chronic kidney disease, or unspecified chronic kidney disease: Secondary | ICD-10-CM | POA: Diagnosis not present

## 2012-02-16 DIAGNOSIS — E119 Type 2 diabetes mellitus without complications: Secondary | ICD-10-CM | POA: Diagnosis not present

## 2012-02-16 DIAGNOSIS — R269 Unspecified abnormalities of gait and mobility: Secondary | ICD-10-CM | POA: Diagnosis not present

## 2012-02-16 DIAGNOSIS — IMO0001 Reserved for inherently not codable concepts without codable children: Secondary | ICD-10-CM | POA: Diagnosis not present

## 2012-02-16 DIAGNOSIS — N189 Chronic kidney disease, unspecified: Secondary | ICD-10-CM | POA: Diagnosis not present

## 2012-02-16 DIAGNOSIS — I129 Hypertensive chronic kidney disease with stage 1 through stage 4 chronic kidney disease, or unspecified chronic kidney disease: Secondary | ICD-10-CM | POA: Diagnosis not present

## 2012-02-17 DIAGNOSIS — I10 Essential (primary) hypertension: Secondary | ICD-10-CM | POA: Diagnosis not present

## 2012-02-17 DIAGNOSIS — R279 Unspecified lack of coordination: Secondary | ICD-10-CM | POA: Diagnosis not present

## 2012-02-20 DIAGNOSIS — IMO0001 Reserved for inherently not codable concepts without codable children: Secondary | ICD-10-CM | POA: Diagnosis not present

## 2012-02-20 DIAGNOSIS — E119 Type 2 diabetes mellitus without complications: Secondary | ICD-10-CM | POA: Diagnosis not present

## 2012-02-20 DIAGNOSIS — N189 Chronic kidney disease, unspecified: Secondary | ICD-10-CM | POA: Diagnosis not present

## 2012-02-20 DIAGNOSIS — I129 Hypertensive chronic kidney disease with stage 1 through stage 4 chronic kidney disease, or unspecified chronic kidney disease: Secondary | ICD-10-CM | POA: Diagnosis not present

## 2012-02-20 DIAGNOSIS — R269 Unspecified abnormalities of gait and mobility: Secondary | ICD-10-CM | POA: Diagnosis not present

## 2012-02-22 DIAGNOSIS — IMO0001 Reserved for inherently not codable concepts without codable children: Secondary | ICD-10-CM | POA: Diagnosis not present

## 2012-02-22 DIAGNOSIS — E119 Type 2 diabetes mellitus without complications: Secondary | ICD-10-CM | POA: Diagnosis not present

## 2012-02-22 DIAGNOSIS — R269 Unspecified abnormalities of gait and mobility: Secondary | ICD-10-CM | POA: Diagnosis not present

## 2012-02-22 DIAGNOSIS — I129 Hypertensive chronic kidney disease with stage 1 through stage 4 chronic kidney disease, or unspecified chronic kidney disease: Secondary | ICD-10-CM | POA: Diagnosis not present

## 2012-02-22 DIAGNOSIS — N189 Chronic kidney disease, unspecified: Secondary | ICD-10-CM | POA: Diagnosis not present

## 2012-04-05 DIAGNOSIS — H43399 Other vitreous opacities, unspecified eye: Secondary | ICD-10-CM | POA: Diagnosis not present

## 2012-04-05 DIAGNOSIS — H5200 Hypermetropia, unspecified eye: Secondary | ICD-10-CM | POA: Diagnosis not present

## 2012-04-05 DIAGNOSIS — H25099 Other age-related incipient cataract, unspecified eye: Secondary | ICD-10-CM | POA: Diagnosis not present

## 2012-04-05 DIAGNOSIS — H524 Presbyopia: Secondary | ICD-10-CM | POA: Diagnosis not present

## 2012-04-17 DIAGNOSIS — E0590 Thyrotoxicosis, unspecified without thyrotoxic crisis or storm: Secondary | ICD-10-CM | POA: Diagnosis not present

## 2012-04-17 DIAGNOSIS — R269 Unspecified abnormalities of gait and mobility: Secondary | ICD-10-CM | POA: Diagnosis not present

## 2012-04-17 DIAGNOSIS — I10 Essential (primary) hypertension: Secondary | ICD-10-CM | POA: Diagnosis not present

## 2012-06-01 DIAGNOSIS — M461 Sacroiliitis, not elsewhere classified: Secondary | ICD-10-CM | POA: Diagnosis not present

## 2012-06-01 DIAGNOSIS — M47817 Spondylosis without myelopathy or radiculopathy, lumbosacral region: Secondary | ICD-10-CM | POA: Diagnosis not present

## 2012-06-01 DIAGNOSIS — IMO0002 Reserved for concepts with insufficient information to code with codable children: Secondary | ICD-10-CM | POA: Diagnosis not present

## 2012-06-01 DIAGNOSIS — M5137 Other intervertebral disc degeneration, lumbosacral region: Secondary | ICD-10-CM | POA: Diagnosis not present

## 2012-06-29 DIAGNOSIS — IMO0002 Reserved for concepts with insufficient information to code with codable children: Secondary | ICD-10-CM | POA: Diagnosis not present

## 2012-06-29 DIAGNOSIS — M5137 Other intervertebral disc degeneration, lumbosacral region: Secondary | ICD-10-CM | POA: Diagnosis not present

## 2012-06-29 DIAGNOSIS — M47817 Spondylosis without myelopathy or radiculopathy, lumbosacral region: Secondary | ICD-10-CM | POA: Diagnosis not present

## 2012-06-29 DIAGNOSIS — M461 Sacroiliitis, not elsewhere classified: Secondary | ICD-10-CM | POA: Diagnosis not present

## 2012-08-02 IMAGING — CT CT CERVICAL SPINE W/O CM
3 of 4 series · 6 of 14 positions shown, 7 images · non-contrast
Comparison: None

CT HEAD

CLINICAL DATA: The patient tripped and fell, hitting face on the
floor.  Nasal swelling.  Left upper incisor is missing. Mouth pain.

CT HEAD WITHOUT CONTRAST
CT MAXILLOFACIAL WITHOUT CONTRAST
CT CERVICAL SPINE WITHOUT CONTRAST
TECHNIQUE: Multidetector CT imaging of the head, cervical spine,
and maxillofacial structures were performed using the standard
protocol without intravenous contrast. Multiplanar CT image
reconstructions of the cervical spine and maxillofacial structures
were also generated.

[Series 6: facial 2.0 h31s st · axial · 0.36mm/px · z∈[+1314,+1370]mm · 2 of 86 slices shown]
[im 29/86  bone]
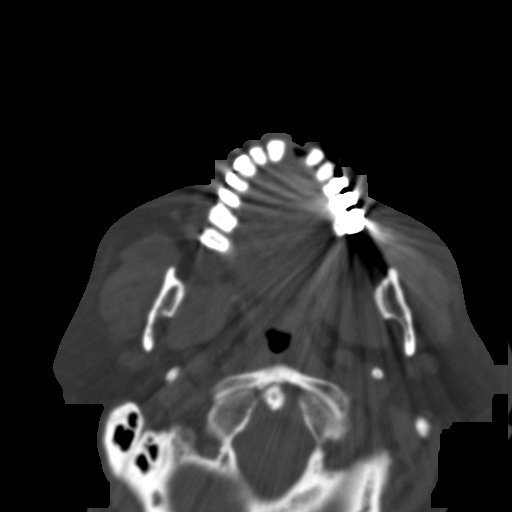
[im 57/86  bone]
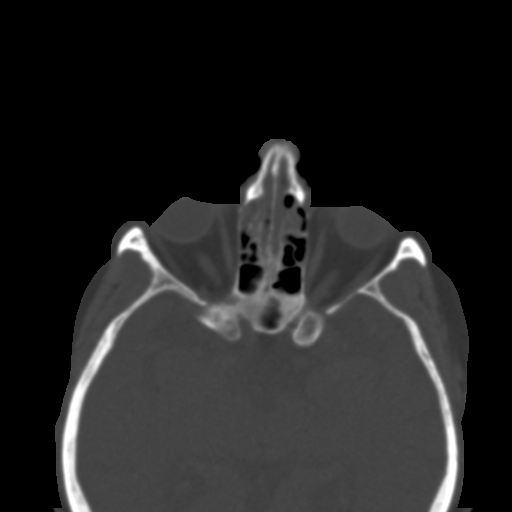

[Series 14: c_spine 2.0 b31s detail · axial · 0.23mm/px · z∈[+1240,+1300]mm · 2 of 92 slices shown, 3 images]
[im 31/92  soft-tissue]
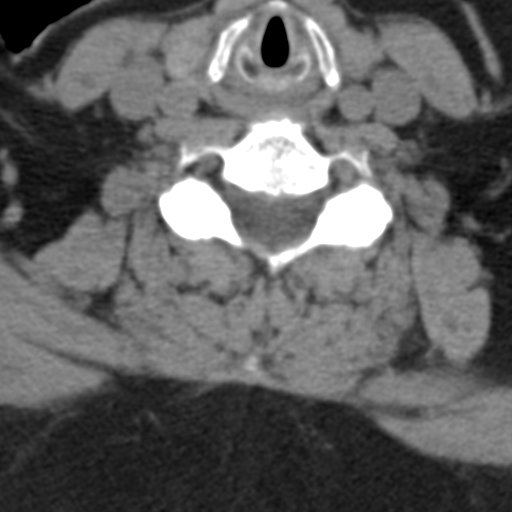
[im 31/92  bone]
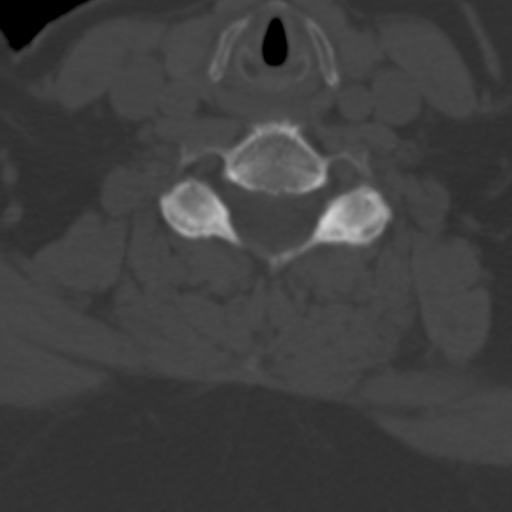
[im 61/92  bone]
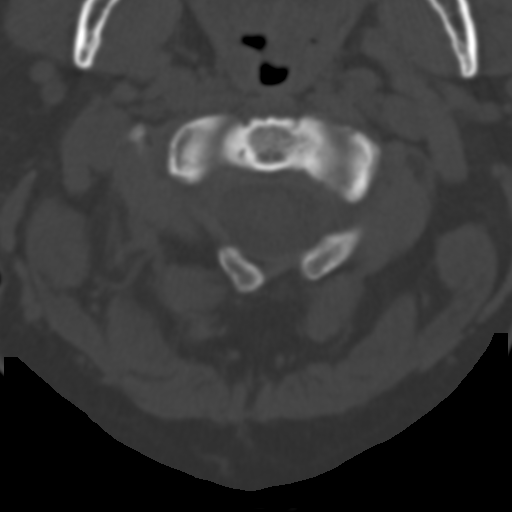

[Series 17: c_spine orthogonals · axial · 0.17mm/px · z∈[+1219,+1275]mm · 2 of 87 slices shown]
[im 29/87  bone]
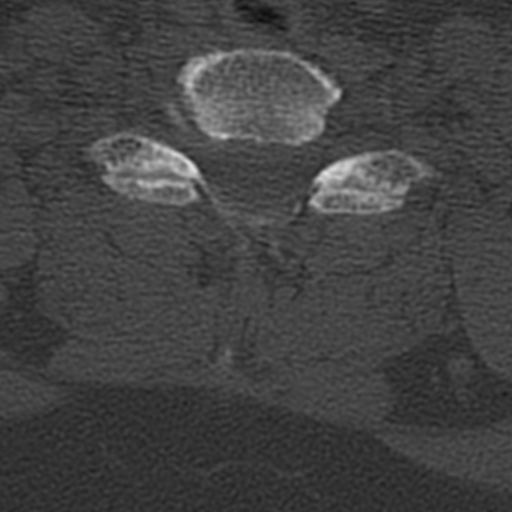
[im 58/87  bone]
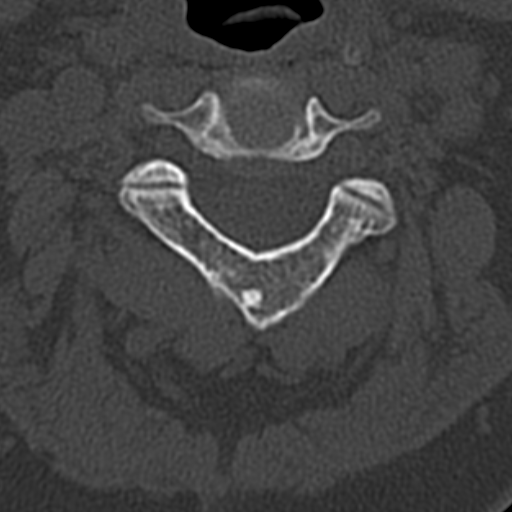

[6 of 14 positions shown; findings below may reference images not displayed]

FINDINGS: Diffuse cerebral atrophy.  Ventricular dilatation
consistent with central atrophy.  Low attenuation change in the
deep white matter suggesting small vessel ischemic change.  No mass
effect or midline shift.  No abnormal extra-axial fluid
collections.  Gray-white matter junctions are distinct.  Basal
cisterns are not effaced.  No evidence of acute intracranial
hemorrhage.  No depressed skull fractures.  Vascular
calcifications.
IMPRESSION: Chronic atrophy and small vessel ischemic change.  No evidence of
acute intracranial hemorrhage, mass lesion, or acute infarct.

CT MAXILLOFACIAL
FINDINGS: There are air-fluid levels in both maxillary antra with
mucosal membrane thickening also in the maxillary antra and
sphenoid sinuses.  Diffuse opacification of the ethmoid air cells.
Changes may represent underlying inflammatory process versus acute
post-traumatic fluid.  Soft tissue swelling over the inferior
aspect of the nose.  The nasal bones and nasal septum appear
intact.  The globes and extraocular muscles are intact and
symmetrical.  The orbital rims, maxillary antral walls, pterygoid
plates, zygomatic arches, mandibles, and temporomandibular joints
appear intact.  No depressed fractures identified.  There is
degenerative change in the right temporomandibular joint with
flattening and sclerosis of the mandibular head.  The left central
upper incisor appears to be broken off.  Visualization of the
mandibular area is limited due to streak artifact from dental work.
There appear to be molar extraction as bilaterally.
IMPRESSION: Opacification of ethmoid air cells.  Mucosal membrane thickening
and air-fluid level in the maxillary antra.  No displaced fractures
are identified in these changes might be due to preexisting sinus
disease.  No depressed orbital or facial fractures identified.
Fracture of the left upper central incisor.

CT CERVICAL SPINE
FINDINGS: Normal alignment of the cervical vertebrae and facet
joints.  Degenerative changes throughout the facet joints.
Narrowed cervical interspaces and endplate hypertrophic
degenerative changes at C4-5, C5-6, and C6-7 levels.  Congenital
fusion of C3-C4.  Degenerative changes at C1-2.  No vertebral
compression deformities.  No prevertebral soft tissue swelling.
Moderate posterior disc osteophyte complexes at C4-5, C5-6, and C6-
7 levels.  Lateral masses of C1 are symmetrical.  The odontoid
process appears intact.  No paraspinal soft tissue infiltration.
Visualized cervical lymph nodes are not pathologically enlarged.
IMPRESSION: Degenerative changes in the cervical spine.  Probable congenital
fusion of C3-C4.  No displaced fractures identified.

## 2012-08-10 DIAGNOSIS — G309 Alzheimer's disease, unspecified: Secondary | ICD-10-CM | POA: Diagnosis not present

## 2012-08-10 DIAGNOSIS — F0280 Dementia in other diseases classified elsewhere without behavioral disturbance: Secondary | ICD-10-CM | POA: Diagnosis not present

## 2012-08-10 DIAGNOSIS — R350 Frequency of micturition: Secondary | ICD-10-CM | POA: Diagnosis not present

## 2012-08-10 DIAGNOSIS — R269 Unspecified abnormalities of gait and mobility: Secondary | ICD-10-CM | POA: Diagnosis not present

## 2012-08-21 DIAGNOSIS — F0280 Dementia in other diseases classified elsewhere without behavioral disturbance: Secondary | ICD-10-CM | POA: Diagnosis not present

## 2012-08-21 DIAGNOSIS — G309 Alzheimer's disease, unspecified: Secondary | ICD-10-CM | POA: Diagnosis not present

## 2012-08-31 DIAGNOSIS — R269 Unspecified abnormalities of gait and mobility: Secondary | ICD-10-CM | POA: Diagnosis not present

## 2012-08-31 DIAGNOSIS — I69919 Unspecified symptoms and signs involving cognitive functions following unspecified cerebrovascular disease: Secondary | ICD-10-CM | POA: Diagnosis not present

## 2012-08-31 DIAGNOSIS — F0390 Unspecified dementia without behavioral disturbance: Secondary | ICD-10-CM | POA: Diagnosis not present

## 2012-08-31 DIAGNOSIS — R350 Frequency of micturition: Secondary | ICD-10-CM | POA: Diagnosis not present

## 2012-09-24 DIAGNOSIS — F09 Unspecified mental disorder due to known physiological condition: Secondary | ICD-10-CM | POA: Diagnosis not present

## 2012-09-24 DIAGNOSIS — N183 Chronic kidney disease, stage 3 unspecified: Secondary | ICD-10-CM | POA: Diagnosis not present

## 2012-09-24 DIAGNOSIS — G119 Hereditary ataxia, unspecified: Secondary | ICD-10-CM | POA: Diagnosis not present

## 2012-09-24 DIAGNOSIS — E0590 Thyrotoxicosis, unspecified without thyrotoxic crisis or storm: Secondary | ICD-10-CM | POA: Diagnosis not present

## 2012-09-24 DIAGNOSIS — R112 Nausea with vomiting, unspecified: Secondary | ICD-10-CM | POA: Diagnosis not present

## 2012-09-24 DIAGNOSIS — F0280 Dementia in other diseases classified elsewhere without behavioral disturbance: Secondary | ICD-10-CM | POA: Diagnosis not present

## 2012-09-24 DIAGNOSIS — Z23 Encounter for immunization: Secondary | ICD-10-CM | POA: Diagnosis not present

## 2012-09-24 DIAGNOSIS — I1310 Hypertensive heart and chronic kidney disease without heart failure, with stage 1 through stage 4 chronic kidney disease, or unspecified chronic kidney disease: Secondary | ICD-10-CM | POA: Diagnosis not present

## 2012-09-24 DIAGNOSIS — G4733 Obstructive sleep apnea (adult) (pediatric): Secondary | ICD-10-CM | POA: Diagnosis not present

## 2012-09-24 DIAGNOSIS — G309 Alzheimer's disease, unspecified: Secondary | ICD-10-CM | POA: Diagnosis not present

## 2012-09-26 DIAGNOSIS — K449 Diaphragmatic hernia without obstruction or gangrene: Secondary | ICD-10-CM | POA: Diagnosis not present

## 2012-09-26 DIAGNOSIS — R112 Nausea with vomiting, unspecified: Secondary | ICD-10-CM | POA: Diagnosis not present

## 2012-09-28 DIAGNOSIS — D751 Secondary polycythemia: Secondary | ICD-10-CM | POA: Diagnosis not present

## 2012-10-11 DIAGNOSIS — K219 Gastro-esophageal reflux disease without esophagitis: Secondary | ICD-10-CM | POA: Diagnosis not present

## 2012-10-11 DIAGNOSIS — R112 Nausea with vomiting, unspecified: Secondary | ICD-10-CM | POA: Diagnosis not present

## 2012-10-15 DIAGNOSIS — Z7982 Long term (current) use of aspirin: Secondary | ICD-10-CM | POA: Diagnosis not present

## 2012-10-15 DIAGNOSIS — Z79899 Other long term (current) drug therapy: Secondary | ICD-10-CM | POA: Diagnosis not present

## 2012-10-15 DIAGNOSIS — F329 Major depressive disorder, single episode, unspecified: Secondary | ICD-10-CM | POA: Diagnosis not present

## 2012-10-15 DIAGNOSIS — E785 Hyperlipidemia, unspecified: Secondary | ICD-10-CM | POA: Diagnosis not present

## 2012-10-15 DIAGNOSIS — I129 Hypertensive chronic kidney disease with stage 1 through stage 4 chronic kidney disease, or unspecified chronic kidney disease: Secondary | ICD-10-CM | POA: Diagnosis not present

## 2012-10-15 DIAGNOSIS — F0280 Dementia in other diseases classified elsewhere without behavioral disturbance: Secondary | ICD-10-CM | POA: Diagnosis not present

## 2012-10-15 DIAGNOSIS — R933 Abnormal findings on diagnostic imaging of other parts of digestive tract: Secondary | ICD-10-CM | POA: Diagnosis not present

## 2012-10-15 DIAGNOSIS — K2960 Other gastritis without bleeding: Secondary | ICD-10-CM | POA: Diagnosis not present

## 2012-10-15 DIAGNOSIS — K219 Gastro-esophageal reflux disease without esophagitis: Secondary | ICD-10-CM | POA: Diagnosis not present

## 2012-10-15 DIAGNOSIS — K449 Diaphragmatic hernia without obstruction or gangrene: Secondary | ICD-10-CM | POA: Diagnosis not present

## 2012-10-15 DIAGNOSIS — R112 Nausea with vomiting, unspecified: Secondary | ICD-10-CM | POA: Diagnosis not present

## 2012-10-15 DIAGNOSIS — M5137 Other intervertebral disc degeneration, lumbosacral region: Secondary | ICD-10-CM | POA: Diagnosis not present

## 2012-10-16 DIAGNOSIS — R269 Unspecified abnormalities of gait and mobility: Secondary | ICD-10-CM | POA: Diagnosis not present

## 2012-10-16 DIAGNOSIS — G119 Hereditary ataxia, unspecified: Secondary | ICD-10-CM | POA: Diagnosis not present

## 2012-10-16 DIAGNOSIS — M810 Age-related osteoporosis without current pathological fracture: Secondary | ICD-10-CM | POA: Diagnosis not present

## 2012-10-16 DIAGNOSIS — G4733 Obstructive sleep apnea (adult) (pediatric): Secondary | ICD-10-CM | POA: Diagnosis not present

## 2013-01-22 DIAGNOSIS — F0150 Vascular dementia without behavioral disturbance: Secondary | ICD-10-CM | POA: Diagnosis not present

## 2013-01-22 DIAGNOSIS — F0280 Dementia in other diseases classified elsewhere without behavioral disturbance: Secondary | ICD-10-CM | POA: Diagnosis not present

## 2013-01-22 DIAGNOSIS — I1310 Hypertensive heart and chronic kidney disease without heart failure, with stage 1 through stage 4 chronic kidney disease, or unspecified chronic kidney disease: Secondary | ICD-10-CM | POA: Diagnosis not present

## 2013-01-22 DIAGNOSIS — G309 Alzheimer's disease, unspecified: Secondary | ICD-10-CM | POA: Diagnosis not present

## 2013-01-22 DIAGNOSIS — N183 Chronic kidney disease, stage 3 unspecified: Secondary | ICD-10-CM | POA: Diagnosis not present

## 2013-01-22 DIAGNOSIS — F321 Major depressive disorder, single episode, moderate: Secondary | ICD-10-CM | POA: Diagnosis not present

## 2013-01-22 DIAGNOSIS — G119 Hereditary ataxia, unspecified: Secondary | ICD-10-CM | POA: Diagnosis not present

## 2013-01-22 DIAGNOSIS — E785 Hyperlipidemia, unspecified: Secondary | ICD-10-CM | POA: Diagnosis not present

## 2013-01-24 DIAGNOSIS — G4733 Obstructive sleep apnea (adult) (pediatric): Secondary | ICD-10-CM | POA: Diagnosis not present

## 2013-02-28 DIAGNOSIS — L821 Other seborrheic keratosis: Secondary | ICD-10-CM | POA: Diagnosis not present

## 2013-02-28 DIAGNOSIS — L570 Actinic keratosis: Secondary | ICD-10-CM | POA: Diagnosis not present

## 2013-02-28 DIAGNOSIS — L578 Other skin changes due to chronic exposure to nonionizing radiation: Secondary | ICD-10-CM | POA: Diagnosis not present

## 2013-02-28 DIAGNOSIS — R233 Spontaneous ecchymoses: Secondary | ICD-10-CM | POA: Diagnosis not present

## 2013-03-14 DIAGNOSIS — H7290 Unspecified perforation of tympanic membrane, unspecified ear: Secondary | ICD-10-CM | POA: Diagnosis not present

## 2013-03-14 DIAGNOSIS — Z1211 Encounter for screening for malignant neoplasm of colon: Secondary | ICD-10-CM | POA: Diagnosis not present

## 2013-03-14 DIAGNOSIS — I1310 Hypertensive heart and chronic kidney disease without heart failure, with stage 1 through stage 4 chronic kidney disease, or unspecified chronic kidney disease: Secondary | ICD-10-CM | POA: Diagnosis not present

## 2013-03-14 DIAGNOSIS — M810 Age-related osteoporosis without current pathological fracture: Secondary | ICD-10-CM | POA: Diagnosis not present

## 2013-03-25 DIAGNOSIS — H60399 Other infective otitis externa, unspecified ear: Secondary | ICD-10-CM | POA: Diagnosis not present

## 2013-03-25 DIAGNOSIS — M899 Disorder of bone, unspecified: Secondary | ICD-10-CM | POA: Diagnosis not present

## 2013-03-25 DIAGNOSIS — Z1231 Encounter for screening mammogram for malignant neoplasm of breast: Secondary | ICD-10-CM | POA: Diagnosis not present

## 2013-03-25 DIAGNOSIS — H9190 Unspecified hearing loss, unspecified ear: Secondary | ICD-10-CM | POA: Diagnosis not present

## 2013-03-25 DIAGNOSIS — H6590 Unspecified nonsuppurative otitis media, unspecified ear: Secondary | ICD-10-CM | POA: Diagnosis not present

## 2013-03-25 DIAGNOSIS — H9209 Otalgia, unspecified ear: Secondary | ICD-10-CM | POA: Diagnosis not present

## 2013-03-25 DIAGNOSIS — R490 Dysphonia: Secondary | ICD-10-CM | POA: Diagnosis not present

## 2013-03-25 DIAGNOSIS — M810 Age-related osteoporosis without current pathological fracture: Secondary | ICD-10-CM | POA: Diagnosis not present

## 2013-03-25 DIAGNOSIS — H9210 Otorrhea, unspecified ear: Secondary | ICD-10-CM | POA: Diagnosis not present

## 2013-04-08 DIAGNOSIS — H6980 Other specified disorders of Eustachian tube, unspecified ear: Secondary | ICD-10-CM | POA: Diagnosis not present

## 2013-04-08 DIAGNOSIS — H6530 Chronic mucoid otitis media, unspecified ear: Secondary | ICD-10-CM | POA: Diagnosis not present

## 2013-04-08 DIAGNOSIS — H9190 Unspecified hearing loss, unspecified ear: Secondary | ICD-10-CM | POA: Diagnosis not present

## 2013-04-08 DIAGNOSIS — H6990 Unspecified Eustachian tube disorder, unspecified ear: Secondary | ICD-10-CM | POA: Diagnosis not present

## 2013-04-11 DIAGNOSIS — J309 Allergic rhinitis, unspecified: Secondary | ICD-10-CM | POA: Diagnosis not present

## 2013-04-15 DIAGNOSIS — J309 Allergic rhinitis, unspecified: Secondary | ICD-10-CM | POA: Diagnosis not present

## 2013-04-17 DIAGNOSIS — R9431 Abnormal electrocardiogram [ECG] [EKG]: Secondary | ICD-10-CM | POA: Diagnosis not present

## 2013-04-30 DIAGNOSIS — Z01810 Encounter for preprocedural cardiovascular examination: Secondary | ICD-10-CM | POA: Diagnosis not present

## 2013-04-30 DIAGNOSIS — Z8249 Family history of ischemic heart disease and other diseases of the circulatory system: Secondary | ICD-10-CM | POA: Diagnosis not present

## 2013-04-30 DIAGNOSIS — I10 Essential (primary) hypertension: Secondary | ICD-10-CM | POA: Diagnosis not present

## 2013-04-30 DIAGNOSIS — I6529 Occlusion and stenosis of unspecified carotid artery: Secondary | ICD-10-CM | POA: Diagnosis not present

## 2013-04-30 DIAGNOSIS — E785 Hyperlipidemia, unspecified: Secondary | ICD-10-CM | POA: Diagnosis not present

## 2013-04-30 DIAGNOSIS — I2510 Atherosclerotic heart disease of native coronary artery without angina pectoris: Secondary | ICD-10-CM | POA: Diagnosis not present

## 2013-05-01 DIAGNOSIS — I6529 Occlusion and stenosis of unspecified carotid artery: Secondary | ICD-10-CM | POA: Diagnosis not present

## 2013-05-01 DIAGNOSIS — R0989 Other specified symptoms and signs involving the circulatory and respiratory systems: Secondary | ICD-10-CM | POA: Diagnosis not present

## 2013-05-13 DIAGNOSIS — I2510 Atherosclerotic heart disease of native coronary artery without angina pectoris: Secondary | ICD-10-CM | POA: Diagnosis not present

## 2013-05-20 DIAGNOSIS — G4733 Obstructive sleep apnea (adult) (pediatric): Secondary | ICD-10-CM | POA: Diagnosis not present

## 2013-05-20 DIAGNOSIS — I6529 Occlusion and stenosis of unspecified carotid artery: Secondary | ICD-10-CM | POA: Diagnosis not present

## 2013-05-20 DIAGNOSIS — E785 Hyperlipidemia, unspecified: Secondary | ICD-10-CM | POA: Diagnosis not present

## 2013-05-20 DIAGNOSIS — I2510 Atherosclerotic heart disease of native coronary artery without angina pectoris: Secondary | ICD-10-CM | POA: Diagnosis not present

## 2013-05-31 DIAGNOSIS — M20019 Mallet finger of unspecified finger(s): Secondary | ICD-10-CM | POA: Diagnosis not present

## 2013-05-31 DIAGNOSIS — M1710 Unilateral primary osteoarthritis, unspecified knee: Secondary | ICD-10-CM | POA: Diagnosis not present

## 2013-06-01 DIAGNOSIS — M5137 Other intervertebral disc degeneration, lumbosacral region: Secondary | ICD-10-CM | POA: Insufficient documentation

## 2013-06-17 DIAGNOSIS — T148XXA Other injury of unspecified body region, initial encounter: Secondary | ICD-10-CM | POA: Diagnosis not present

## 2013-07-01 DIAGNOSIS — M20019 Mallet finger of unspecified finger(s): Secondary | ICD-10-CM | POA: Diagnosis not present

## 2013-07-12 DIAGNOSIS — I2510 Atherosclerotic heart disease of native coronary artery without angina pectoris: Secondary | ICD-10-CM | POA: Diagnosis not present

## 2013-07-12 DIAGNOSIS — E785 Hyperlipidemia, unspecified: Secondary | ICD-10-CM | POA: Diagnosis not present

## 2013-07-12 DIAGNOSIS — H2589 Other age-related cataract: Secondary | ICD-10-CM | POA: Diagnosis not present

## 2013-07-12 DIAGNOSIS — H35379 Puckering of macula, unspecified eye: Secondary | ICD-10-CM | POA: Diagnosis not present

## 2013-07-19 DIAGNOSIS — N390 Urinary tract infection, site not specified: Secondary | ICD-10-CM | POA: Diagnosis not present

## 2013-07-19 DIAGNOSIS — Z79899 Other long term (current) drug therapy: Secondary | ICD-10-CM | POA: Diagnosis not present

## 2013-07-22 DIAGNOSIS — N390 Urinary tract infection, site not specified: Secondary | ICD-10-CM | POA: Diagnosis not present

## 2013-07-22 DIAGNOSIS — N183 Chronic kidney disease, stage 3 unspecified: Secondary | ICD-10-CM | POA: Diagnosis not present

## 2013-07-23 DIAGNOSIS — G4730 Sleep apnea, unspecified: Secondary | ICD-10-CM | POA: Diagnosis not present

## 2013-07-23 DIAGNOSIS — H2589 Other age-related cataract: Secondary | ICD-10-CM | POA: Diagnosis not present

## 2013-07-23 DIAGNOSIS — H269 Unspecified cataract: Secondary | ICD-10-CM | POA: Diagnosis not present

## 2013-07-30 DIAGNOSIS — H04129 Dry eye syndrome of unspecified lacrimal gland: Secondary | ICD-10-CM | POA: Diagnosis not present

## 2013-08-01 DIAGNOSIS — M20019 Mallet finger of unspecified finger(s): Secondary | ICD-10-CM | POA: Diagnosis not present

## 2013-08-06 DIAGNOSIS — H269 Unspecified cataract: Secondary | ICD-10-CM | POA: Diagnosis not present

## 2013-08-06 DIAGNOSIS — Z8673 Personal history of transient ischemic attack (TIA), and cerebral infarction without residual deficits: Secondary | ICD-10-CM | POA: Diagnosis not present

## 2013-08-06 DIAGNOSIS — H2589 Other age-related cataract: Secondary | ICD-10-CM | POA: Diagnosis not present

## 2013-08-08 DIAGNOSIS — I2510 Atherosclerotic heart disease of native coronary artery without angina pectoris: Secondary | ICD-10-CM | POA: Diagnosis not present

## 2013-08-08 DIAGNOSIS — E785 Hyperlipidemia, unspecified: Secondary | ICD-10-CM | POA: Diagnosis not present

## 2013-08-08 DIAGNOSIS — I6529 Occlusion and stenosis of unspecified carotid artery: Secondary | ICD-10-CM | POA: Diagnosis not present

## 2013-08-08 DIAGNOSIS — I1310 Hypertensive heart and chronic kidney disease without heart failure, with stage 1 through stage 4 chronic kidney disease, or unspecified chronic kidney disease: Secondary | ICD-10-CM | POA: Diagnosis not present

## 2013-08-30 ENCOUNTER — Encounter: Payer: Self-pay | Admitting: Cardiology

## 2013-11-20 DIAGNOSIS — M48061 Spinal stenosis, lumbar region without neurogenic claudication: Secondary | ICD-10-CM | POA: Diagnosis not present

## 2013-11-20 DIAGNOSIS — IMO0002 Reserved for concepts with insufficient information to code with codable children: Secondary | ICD-10-CM | POA: Diagnosis not present

## 2013-11-20 DIAGNOSIS — M47817 Spondylosis without myelopathy or radiculopathy, lumbosacral region: Secondary | ICD-10-CM | POA: Diagnosis not present

## 2013-11-20 DIAGNOSIS — M5137 Other intervertebral disc degeneration, lumbosacral region: Secondary | ICD-10-CM | POA: Diagnosis not present

## 2014-01-22 DIAGNOSIS — M461 Sacroiliitis, not elsewhere classified: Secondary | ICD-10-CM | POA: Diagnosis not present

## 2014-01-22 DIAGNOSIS — E669 Obesity, unspecified: Secondary | ICD-10-CM | POA: Diagnosis not present

## 2014-01-22 DIAGNOSIS — I1310 Hypertensive heart and chronic kidney disease without heart failure, with stage 1 through stage 4 chronic kidney disease, or unspecified chronic kidney disease: Secondary | ICD-10-CM | POA: Diagnosis not present

## 2014-01-22 DIAGNOSIS — G119 Hereditary ataxia, unspecified: Secondary | ICD-10-CM | POA: Diagnosis not present

## 2014-01-22 DIAGNOSIS — IMO0002 Reserved for concepts with insufficient information to code with codable children: Secondary | ICD-10-CM | POA: Diagnosis not present

## 2014-01-22 DIAGNOSIS — J449 Chronic obstructive pulmonary disease, unspecified: Secondary | ICD-10-CM | POA: Diagnosis not present

## 2014-01-22 DIAGNOSIS — N183 Chronic kidney disease, stage 3 unspecified: Secondary | ICD-10-CM | POA: Diagnosis not present

## 2014-01-30 DIAGNOSIS — N183 Chronic kidney disease, stage 3 unspecified: Secondary | ICD-10-CM | POA: Diagnosis not present

## 2014-01-30 DIAGNOSIS — Z23 Encounter for immunization: Secondary | ICD-10-CM | POA: Diagnosis not present

## 2014-03-04 DIAGNOSIS — R0989 Other specified symptoms and signs involving the circulatory and respiratory systems: Secondary | ICD-10-CM | POA: Diagnosis not present

## 2014-03-04 DIAGNOSIS — J209 Acute bronchitis, unspecified: Secondary | ICD-10-CM | POA: Diagnosis not present

## 2014-03-04 DIAGNOSIS — R0609 Other forms of dyspnea: Secondary | ICD-10-CM | POA: Diagnosis not present

## 2014-03-17 DIAGNOSIS — M19079 Primary osteoarthritis, unspecified ankle and foot: Secondary | ICD-10-CM | POA: Diagnosis not present

## 2014-03-21 DIAGNOSIS — R059 Cough, unspecified: Secondary | ICD-10-CM | POA: Diagnosis not present

## 2014-03-21 DIAGNOSIS — R05 Cough: Secondary | ICD-10-CM | POA: Diagnosis not present

## 2014-03-21 DIAGNOSIS — J309 Allergic rhinitis, unspecified: Secondary | ICD-10-CM | POA: Diagnosis not present

## 2014-03-23 DIAGNOSIS — J209 Acute bronchitis, unspecified: Secondary | ICD-10-CM | POA: Diagnosis not present

## 2014-03-23 DIAGNOSIS — N3000 Acute cystitis without hematuria: Secondary | ICD-10-CM | POA: Diagnosis not present

## 2014-03-23 DIAGNOSIS — N390 Urinary tract infection, site not specified: Secondary | ICD-10-CM | POA: Diagnosis not present

## 2014-04-16 DIAGNOSIS — N390 Urinary tract infection, site not specified: Secondary | ICD-10-CM | POA: Diagnosis not present

## 2014-04-16 DIAGNOSIS — N3941 Urge incontinence: Secondary | ICD-10-CM | POA: Diagnosis not present

## 2014-04-16 DIAGNOSIS — N183 Chronic kidney disease, stage 3 unspecified: Secondary | ICD-10-CM | POA: Diagnosis not present

## 2014-04-23 DIAGNOSIS — N3090 Cystitis, unspecified without hematuria: Secondary | ICD-10-CM | POA: Diagnosis not present

## 2014-04-23 DIAGNOSIS — N3289 Other specified disorders of bladder: Secondary | ICD-10-CM | POA: Diagnosis not present

## 2014-04-23 DIAGNOSIS — N952 Postmenopausal atrophic vaginitis: Secondary | ICD-10-CM | POA: Diagnosis not present

## 2014-05-23 DIAGNOSIS — N3090 Cystitis, unspecified without hematuria: Secondary | ICD-10-CM | POA: Diagnosis not present

## 2014-05-23 DIAGNOSIS — N3289 Other specified disorders of bladder: Secondary | ICD-10-CM | POA: Diagnosis not present

## 2014-05-23 DIAGNOSIS — N952 Postmenopausal atrophic vaginitis: Secondary | ICD-10-CM | POA: Diagnosis not present

## 2014-06-09 DIAGNOSIS — N952 Postmenopausal atrophic vaginitis: Secondary | ICD-10-CM | POA: Diagnosis not present

## 2014-06-09 DIAGNOSIS — R300 Dysuria: Secondary | ICD-10-CM | POA: Diagnosis not present

## 2014-06-09 DIAGNOSIS — N3000 Acute cystitis without hematuria: Secondary | ICD-10-CM | POA: Diagnosis not present

## 2014-07-07 DIAGNOSIS — N952 Postmenopausal atrophic vaginitis: Secondary | ICD-10-CM | POA: Diagnosis not present

## 2014-07-07 DIAGNOSIS — N3090 Cystitis, unspecified without hematuria: Secondary | ICD-10-CM | POA: Diagnosis not present

## 2014-08-07 DIAGNOSIS — N183 Chronic kidney disease, stage 3 unspecified: Secondary | ICD-10-CM | POA: Diagnosis present

## 2014-08-07 DIAGNOSIS — G4733 Obstructive sleep apnea (adult) (pediatric): Secondary | ICD-10-CM | POA: Insufficient documentation

## 2014-08-07 DIAGNOSIS — E669 Obesity, unspecified: Secondary | ICD-10-CM | POA: Insufficient documentation

## 2014-08-07 DIAGNOSIS — I2510 Atherosclerotic heart disease of native coronary artery without angina pectoris: Secondary | ICD-10-CM | POA: Insufficient documentation

## 2014-09-09 DIAGNOSIS — J309 Allergic rhinitis, unspecified: Secondary | ICD-10-CM | POA: Diagnosis not present

## 2014-09-09 DIAGNOSIS — H8110 Benign paroxysmal vertigo, unspecified ear: Secondary | ICD-10-CM | POA: Diagnosis not present

## 2014-09-09 DIAGNOSIS — N183 Chronic kidney disease, stage 3 unspecified: Secondary | ICD-10-CM | POA: Diagnosis not present

## 2014-09-09 DIAGNOSIS — J3089 Other allergic rhinitis: Secondary | ICD-10-CM | POA: Diagnosis not present

## 2014-09-09 DIAGNOSIS — Z23 Encounter for immunization: Secondary | ICD-10-CM | POA: Diagnosis not present

## 2014-09-09 DIAGNOSIS — I1310 Hypertensive heart and chronic kidney disease without heart failure, with stage 1 through stage 4 chronic kidney disease, or unspecified chronic kidney disease: Secondary | ICD-10-CM | POA: Diagnosis not present

## 2014-09-09 DIAGNOSIS — J449 Chronic obstructive pulmonary disease, unspecified: Secondary | ICD-10-CM | POA: Diagnosis not present

## 2014-09-18 DIAGNOSIS — K648 Other hemorrhoids: Secondary | ICD-10-CM | POA: Diagnosis not present

## 2014-09-22 DIAGNOSIS — K648 Other hemorrhoids: Secondary | ICD-10-CM | POA: Diagnosis not present

## 2014-09-22 DIAGNOSIS — K644 Residual hemorrhoidal skin tags: Secondary | ICD-10-CM | POA: Diagnosis not present

## 2014-09-22 DIAGNOSIS — Z87891 Personal history of nicotine dependence: Secondary | ICD-10-CM | POA: Diagnosis not present

## 2014-09-22 DIAGNOSIS — K649 Unspecified hemorrhoids: Secondary | ICD-10-CM | POA: Diagnosis not present

## 2014-09-22 DIAGNOSIS — Z79899 Other long term (current) drug therapy: Secondary | ICD-10-CM | POA: Diagnosis not present

## 2014-09-22 DIAGNOSIS — Z6830 Body mass index (BMI) 30.0-30.9, adult: Secondary | ICD-10-CM | POA: Diagnosis not present

## 2014-09-22 DIAGNOSIS — I129 Hypertensive chronic kidney disease with stage 1 through stage 4 chronic kidney disease, or unspecified chronic kidney disease: Secondary | ICD-10-CM | POA: Diagnosis not present

## 2014-09-22 DIAGNOSIS — G4730 Sleep apnea, unspecified: Secondary | ICD-10-CM | POA: Diagnosis not present

## 2014-09-22 DIAGNOSIS — J45909 Unspecified asthma, uncomplicated: Secondary | ICD-10-CM | POA: Diagnosis not present

## 2014-09-22 DIAGNOSIS — Z862 Personal history of diseases of the blood and blood-forming organs and certain disorders involving the immune mechanism: Secondary | ICD-10-CM | POA: Diagnosis not present

## 2014-09-22 DIAGNOSIS — K219 Gastro-esophageal reflux disease without esophagitis: Secondary | ICD-10-CM | POA: Diagnosis not present

## 2014-09-22 DIAGNOSIS — E669 Obesity, unspecified: Secondary | ICD-10-CM | POA: Diagnosis not present

## 2014-09-22 DIAGNOSIS — Z7982 Long term (current) use of aspirin: Secondary | ICD-10-CM | POA: Diagnosis not present

## 2014-09-22 DIAGNOSIS — J449 Chronic obstructive pulmonary disease, unspecified: Secondary | ICD-10-CM | POA: Diagnosis not present

## 2014-09-22 DIAGNOSIS — I2510 Atherosclerotic heart disease of native coronary artery without angina pectoris: Secondary | ICD-10-CM | POA: Diagnosis not present

## 2014-09-22 DIAGNOSIS — N183 Chronic kidney disease, stage 3 unspecified: Secondary | ICD-10-CM | POA: Diagnosis not present

## 2014-10-17 DIAGNOSIS — J0100 Acute maxillary sinusitis, unspecified: Secondary | ICD-10-CM | POA: Diagnosis not present

## 2014-10-17 DIAGNOSIS — N3090 Cystitis, unspecified without hematuria: Secondary | ICD-10-CM | POA: Diagnosis not present

## 2014-11-28 DIAGNOSIS — H6011 Cellulitis of right external ear: Secondary | ICD-10-CM | POA: Diagnosis not present

## 2014-12-15 DIAGNOSIS — H6691 Otitis media, unspecified, right ear: Secondary | ICD-10-CM | POA: Diagnosis not present

## 2014-12-16 DIAGNOSIS — H60391 Other infective otitis externa, right ear: Secondary | ICD-10-CM | POA: Diagnosis not present

## 2014-12-16 DIAGNOSIS — H7291 Unspecified perforation of tympanic membrane, right ear: Secondary | ICD-10-CM | POA: Diagnosis not present

## 2014-12-16 DIAGNOSIS — H9209 Otalgia, unspecified ear: Secondary | ICD-10-CM | POA: Diagnosis not present

## 2015-01-01 DIAGNOSIS — J209 Acute bronchitis, unspecified: Secondary | ICD-10-CM | POA: Diagnosis not present

## 2015-01-19 DIAGNOSIS — Z1389 Encounter for screening for other disorder: Secondary | ICD-10-CM | POA: Diagnosis not present

## 2015-01-19 DIAGNOSIS — I10 Essential (primary) hypertension: Secondary | ICD-10-CM | POA: Diagnosis not present

## 2015-01-19 DIAGNOSIS — E785 Hyperlipidemia, unspecified: Secondary | ICD-10-CM | POA: Diagnosis not present

## 2015-01-19 DIAGNOSIS — G459 Transient cerebral ischemic attack, unspecified: Secondary | ICD-10-CM | POA: Diagnosis not present

## 2015-01-21 DIAGNOSIS — G459 Transient cerebral ischemic attack, unspecified: Secondary | ICD-10-CM | POA: Diagnosis not present

## 2015-01-21 DIAGNOSIS — H5711 Ocular pain, right eye: Secondary | ICD-10-CM | POA: Diagnosis not present

## 2015-01-24 DIAGNOSIS — Z992 Dependence on renal dialysis: Secondary | ICD-10-CM | POA: Diagnosis not present

## 2015-01-24 DIAGNOSIS — J811 Chronic pulmonary edema: Secondary | ICD-10-CM | POA: Diagnosis not present

## 2015-01-24 DIAGNOSIS — N186 End stage renal disease: Secondary | ICD-10-CM | POA: Diagnosis not present

## 2015-01-24 DIAGNOSIS — E8770 Fluid overload, unspecified: Secondary | ICD-10-CM | POA: Diagnosis not present

## 2015-01-25 DIAGNOSIS — N186 End stage renal disease: Secondary | ICD-10-CM | POA: Diagnosis not present

## 2015-01-25 DIAGNOSIS — Z992 Dependence on renal dialysis: Secondary | ICD-10-CM | POA: Diagnosis not present

## 2015-01-25 DIAGNOSIS — J811 Chronic pulmonary edema: Secondary | ICD-10-CM | POA: Diagnosis not present

## 2015-01-25 DIAGNOSIS — E8770 Fluid overload, unspecified: Secondary | ICD-10-CM | POA: Diagnosis not present

## 2015-01-30 DIAGNOSIS — I63322 Cerebral infarction due to thrombosis of left anterior cerebral artery: Secondary | ICD-10-CM | POA: Diagnosis not present

## 2015-01-30 DIAGNOSIS — G459 Transient cerebral ischemic attack, unspecified: Secondary | ICD-10-CM | POA: Diagnosis not present

## 2015-01-30 DIAGNOSIS — Z6831 Body mass index (BMI) 31.0-31.9, adult: Secondary | ICD-10-CM | POA: Diagnosis not present

## 2015-02-13 DIAGNOSIS — I6522 Occlusion and stenosis of left carotid artery: Secondary | ICD-10-CM | POA: Diagnosis not present

## 2015-02-13 DIAGNOSIS — I63322 Cerebral infarction due to thrombosis of left anterior cerebral artery: Secondary | ICD-10-CM | POA: Diagnosis not present

## 2015-02-13 DIAGNOSIS — G459 Transient cerebral ischemic attack, unspecified: Secondary | ICD-10-CM | POA: Diagnosis not present

## 2015-02-16 DIAGNOSIS — G459 Transient cerebral ischemic attack, unspecified: Secondary | ICD-10-CM | POA: Diagnosis not present

## 2015-02-16 DIAGNOSIS — Z6831 Body mass index (BMI) 31.0-31.9, adult: Secondary | ICD-10-CM | POA: Diagnosis not present

## 2015-02-16 DIAGNOSIS — M5137 Other intervertebral disc degeneration, lumbosacral region: Secondary | ICD-10-CM | POA: Diagnosis not present

## 2015-02-16 DIAGNOSIS — I63322 Cerebral infarction due to thrombosis of left anterior cerebral artery: Secondary | ICD-10-CM | POA: Diagnosis not present

## 2015-03-10 DIAGNOSIS — R609 Edema, unspecified: Secondary | ICD-10-CM | POA: Diagnosis not present

## 2015-03-10 DIAGNOSIS — R300 Dysuria: Secondary | ICD-10-CM | POA: Diagnosis not present

## 2015-03-11 DIAGNOSIS — M79604 Pain in right leg: Secondary | ICD-10-CM | POA: Diagnosis not present

## 2015-03-11 DIAGNOSIS — N390 Urinary tract infection, site not specified: Secondary | ICD-10-CM | POA: Diagnosis not present

## 2015-03-11 DIAGNOSIS — R609 Edema, unspecified: Secondary | ICD-10-CM | POA: Diagnosis not present

## 2015-03-11 DIAGNOSIS — M79661 Pain in right lower leg: Secondary | ICD-10-CM | POA: Diagnosis not present

## 2015-04-16 DIAGNOSIS — J0100 Acute maxillary sinusitis, unspecified: Secondary | ICD-10-CM | POA: Diagnosis not present

## 2015-04-16 DIAGNOSIS — S51811A Laceration without foreign body of right forearm, initial encounter: Secondary | ICD-10-CM | POA: Diagnosis not present

## 2015-06-16 DIAGNOSIS — M069 Rheumatoid arthritis, unspecified: Secondary | ICD-10-CM | POA: Diagnosis not present

## 2015-06-16 DIAGNOSIS — E0590 Thyrotoxicosis, unspecified without thyrotoxic crisis or storm: Secondary | ICD-10-CM | POA: Diagnosis not present

## 2015-06-16 DIAGNOSIS — I10 Essential (primary) hypertension: Secondary | ICD-10-CM | POA: Diagnosis not present

## 2015-06-16 DIAGNOSIS — D519 Vitamin B12 deficiency anemia, unspecified: Secondary | ICD-10-CM | POA: Diagnosis not present

## 2015-06-16 DIAGNOSIS — E559 Vitamin D deficiency, unspecified: Secondary | ICD-10-CM | POA: Diagnosis not present

## 2015-06-16 DIAGNOSIS — M8580 Other specified disorders of bone density and structure, unspecified site: Secondary | ICD-10-CM | POA: Diagnosis not present

## 2015-06-16 DIAGNOSIS — J4520 Mild intermittent asthma, uncomplicated: Secondary | ICD-10-CM | POA: Diagnosis not present

## 2015-06-16 DIAGNOSIS — M5136 Other intervertebral disc degeneration, lumbar region: Secondary | ICD-10-CM | POA: Diagnosis not present

## 2015-06-16 DIAGNOSIS — N183 Chronic kidney disease, stage 3 (moderate): Secondary | ICD-10-CM | POA: Diagnosis not present

## 2015-06-16 DIAGNOSIS — J302 Other seasonal allergic rhinitis: Secondary | ICD-10-CM | POA: Diagnosis not present

## 2015-06-16 DIAGNOSIS — F330 Major depressive disorder, recurrent, mild: Secondary | ICD-10-CM | POA: Diagnosis not present

## 2015-06-16 DIAGNOSIS — E785 Hyperlipidemia, unspecified: Secondary | ICD-10-CM | POA: Diagnosis not present

## 2015-07-27 DIAGNOSIS — G459 Transient cerebral ischemic attack, unspecified: Secondary | ICD-10-CM | POA: Diagnosis not present

## 2015-07-27 DIAGNOSIS — I63322 Cerebral infarction due to thrombosis of left anterior cerebral artery: Secondary | ICD-10-CM | POA: Diagnosis not present

## 2015-07-27 DIAGNOSIS — M5137 Other intervertebral disc degeneration, lumbosacral region: Secondary | ICD-10-CM | POA: Diagnosis not present

## 2015-07-27 DIAGNOSIS — G3184 Mild cognitive impairment, so stated: Secondary | ICD-10-CM | POA: Diagnosis not present

## 2015-07-31 DIAGNOSIS — G3184 Mild cognitive impairment, so stated: Secondary | ICD-10-CM | POA: Diagnosis not present

## 2015-07-31 DIAGNOSIS — I63322 Cerebral infarction due to thrombosis of left anterior cerebral artery: Secondary | ICD-10-CM | POA: Diagnosis not present

## 2015-07-31 DIAGNOSIS — I618 Other nontraumatic intracerebral hemorrhage: Secondary | ICD-10-CM | POA: Diagnosis not present

## 2015-07-31 DIAGNOSIS — G459 Transient cerebral ischemic attack, unspecified: Secondary | ICD-10-CM | POA: Diagnosis not present

## 2015-07-31 DIAGNOSIS — I639 Cerebral infarction, unspecified: Secondary | ICD-10-CM | POA: Diagnosis not present

## 2015-08-05 DIAGNOSIS — Z6825 Body mass index (BMI) 25.0-25.9, adult: Secondary | ICD-10-CM | POA: Diagnosis not present

## 2015-08-05 DIAGNOSIS — G3184 Mild cognitive impairment, so stated: Secondary | ICD-10-CM | POA: Diagnosis not present

## 2015-08-05 DIAGNOSIS — G459 Transient cerebral ischemic attack, unspecified: Secondary | ICD-10-CM | POA: Diagnosis not present

## 2015-08-05 DIAGNOSIS — I63322 Cerebral infarction due to thrombosis of left anterior cerebral artery: Secondary | ICD-10-CM | POA: Diagnosis not present

## 2015-10-07 DIAGNOSIS — I63322 Cerebral infarction due to thrombosis of left anterior cerebral artery: Secondary | ICD-10-CM | POA: Diagnosis not present

## 2015-10-07 DIAGNOSIS — G3184 Mild cognitive impairment, so stated: Secondary | ICD-10-CM | POA: Diagnosis not present

## 2015-10-07 DIAGNOSIS — Z6826 Body mass index (BMI) 26.0-26.9, adult: Secondary | ICD-10-CM | POA: Diagnosis not present

## 2015-10-24 DIAGNOSIS — N3091 Cystitis, unspecified with hematuria: Secondary | ICD-10-CM | POA: Diagnosis not present

## 2015-10-24 DIAGNOSIS — R300 Dysuria: Secondary | ICD-10-CM | POA: Diagnosis not present

## 2015-12-16 DIAGNOSIS — M5137 Other intervertebral disc degeneration, lumbosacral region: Secondary | ICD-10-CM | POA: Diagnosis not present

## 2015-12-16 DIAGNOSIS — I63322 Cerebral infarction due to thrombosis of left anterior cerebral artery: Secondary | ICD-10-CM | POA: Diagnosis not present

## 2015-12-16 DIAGNOSIS — G3184 Mild cognitive impairment, so stated: Secondary | ICD-10-CM | POA: Diagnosis not present

## 2015-12-16 DIAGNOSIS — Z6825 Body mass index (BMI) 25.0-25.9, adult: Secondary | ICD-10-CM | POA: Diagnosis not present

## 2016-01-13 DIAGNOSIS — G3184 Mild cognitive impairment, so stated: Secondary | ICD-10-CM | POA: Diagnosis not present

## 2016-01-13 DIAGNOSIS — M5137 Other intervertebral disc degeneration, lumbosacral region: Secondary | ICD-10-CM | POA: Diagnosis not present

## 2016-01-13 DIAGNOSIS — G459 Transient cerebral ischemic attack, unspecified: Secondary | ICD-10-CM | POA: Diagnosis not present

## 2016-01-13 DIAGNOSIS — I63322 Cerebral infarction due to thrombosis of left anterior cerebral artery: Secondary | ICD-10-CM | POA: Diagnosis not present

## 2016-01-13 DIAGNOSIS — Z6824 Body mass index (BMI) 24.0-24.9, adult: Secondary | ICD-10-CM | POA: Diagnosis not present

## 2016-04-04 DIAGNOSIS — I10 Essential (primary) hypertension: Secondary | ICD-10-CM | POA: Diagnosis not present

## 2016-04-04 DIAGNOSIS — E7800 Pure hypercholesterolemia, unspecified: Secondary | ICD-10-CM | POA: Diagnosis not present

## 2016-04-04 DIAGNOSIS — M069 Rheumatoid arthritis, unspecified: Secondary | ICD-10-CM | POA: Diagnosis not present

## 2016-06-20 DIAGNOSIS — L03114 Cellulitis of left upper limb: Secondary | ICD-10-CM | POA: Diagnosis not present

## 2016-07-01 DIAGNOSIS — R531 Weakness: Secondary | ICD-10-CM | POA: Diagnosis not present

## 2016-07-01 DIAGNOSIS — R079 Chest pain, unspecified: Secondary | ICD-10-CM | POA: Diagnosis not present

## 2016-07-01 DIAGNOSIS — R930 Abnormal findings on diagnostic imaging of skull and head, not elsewhere classified: Secondary | ICD-10-CM | POA: Diagnosis not present

## 2016-07-01 DIAGNOSIS — R404 Transient alteration of awareness: Secondary | ICD-10-CM | POA: Diagnosis not present

## 2016-07-01 DIAGNOSIS — I2510 Atherosclerotic heart disease of native coronary artery without angina pectoris: Secondary | ICD-10-CM | POA: Diagnosis not present

## 2016-07-01 DIAGNOSIS — G459 Transient cerebral ischemic attack, unspecified: Secondary | ICD-10-CM | POA: Diagnosis not present

## 2016-07-13 DIAGNOSIS — G3184 Mild cognitive impairment, so stated: Secondary | ICD-10-CM | POA: Diagnosis not present

## 2016-07-13 DIAGNOSIS — Z6826 Body mass index (BMI) 26.0-26.9, adult: Secondary | ICD-10-CM | POA: Diagnosis not present

## 2016-07-13 DIAGNOSIS — M5137 Other intervertebral disc degeneration, lumbosacral region: Secondary | ICD-10-CM | POA: Diagnosis not present

## 2016-07-13 DIAGNOSIS — I639 Cerebral infarction, unspecified: Secondary | ICD-10-CM | POA: Diagnosis not present

## 2016-07-20 DIAGNOSIS — I6522 Occlusion and stenosis of left carotid artery: Secondary | ICD-10-CM | POA: Diagnosis not present

## 2016-07-20 DIAGNOSIS — G3189 Other specified degenerative diseases of nervous system: Secondary | ICD-10-CM | POA: Diagnosis not present

## 2016-07-27 DIAGNOSIS — Z6828 Body mass index (BMI) 28.0-28.9, adult: Secondary | ICD-10-CM | POA: Diagnosis not present

## 2016-07-27 DIAGNOSIS — I6522 Occlusion and stenosis of left carotid artery: Secondary | ICD-10-CM | POA: Diagnosis not present

## 2016-07-27 DIAGNOSIS — G3184 Mild cognitive impairment, so stated: Secondary | ICD-10-CM | POA: Diagnosis not present

## 2016-08-17 DIAGNOSIS — Z1389 Encounter for screening for other disorder: Secondary | ICD-10-CM | POA: Diagnosis not present

## 2016-08-17 DIAGNOSIS — I10 Essential (primary) hypertension: Secondary | ICD-10-CM | POA: Diagnosis not present

## 2016-08-17 DIAGNOSIS — E559 Vitamin D deficiency, unspecified: Secondary | ICD-10-CM | POA: Diagnosis not present

## 2016-08-17 DIAGNOSIS — M8580 Other specified disorders of bone density and structure, unspecified site: Secondary | ICD-10-CM | POA: Diagnosis not present

## 2016-08-17 DIAGNOSIS — E785 Hyperlipidemia, unspecified: Secondary | ICD-10-CM | POA: Diagnosis not present

## 2016-08-17 DIAGNOSIS — D649 Anemia, unspecified: Secondary | ICD-10-CM | POA: Diagnosis not present

## 2016-08-17 DIAGNOSIS — N390 Urinary tract infection, site not specified: Secondary | ICD-10-CM | POA: Diagnosis not present

## 2016-08-17 DIAGNOSIS — N183 Chronic kidney disease, stage 3 (moderate): Secondary | ICD-10-CM | POA: Diagnosis not present

## 2016-08-17 DIAGNOSIS — J45909 Unspecified asthma, uncomplicated: Secondary | ICD-10-CM | POA: Diagnosis not present

## 2016-08-17 DIAGNOSIS — E0590 Thyrotoxicosis, unspecified without thyrotoxic crisis or storm: Secondary | ICD-10-CM | POA: Diagnosis not present

## 2016-08-17 DIAGNOSIS — K219 Gastro-esophageal reflux disease without esophagitis: Secondary | ICD-10-CM | POA: Diagnosis not present

## 2016-08-17 DIAGNOSIS — E538 Deficiency of other specified B group vitamins: Secondary | ICD-10-CM | POA: Diagnosis not present

## 2016-08-17 DIAGNOSIS — M069 Rheumatoid arthritis, unspecified: Secondary | ICD-10-CM | POA: Diagnosis not present

## 2016-08-26 ENCOUNTER — Other Ambulatory Visit: Payer: Self-pay

## 2016-10-12 DIAGNOSIS — Z23 Encounter for immunization: Secondary | ICD-10-CM | POA: Diagnosis not present

## 2016-10-22 DIAGNOSIS — S60561D Insect bite (nonvenomous) of right hand, subsequent encounter: Secondary | ICD-10-CM | POA: Diagnosis not present

## 2016-11-13 DIAGNOSIS — J019 Acute sinusitis, unspecified: Secondary | ICD-10-CM | POA: Diagnosis not present

## 2016-11-28 DIAGNOSIS — L539 Erythematous condition, unspecified: Secondary | ICD-10-CM | POA: Diagnosis not present

## 2016-11-28 DIAGNOSIS — I10 Essential (primary) hypertension: Secondary | ICD-10-CM | POA: Diagnosis not present

## 2016-11-28 DIAGNOSIS — W57XXXA Bitten or stung by nonvenomous insect and other nonvenomous arthropods, initial encounter: Secondary | ICD-10-CM | POA: Diagnosis not present

## 2016-12-25 DIAGNOSIS — R300 Dysuria: Secondary | ICD-10-CM | POA: Diagnosis not present

## 2017-01-02 DIAGNOSIS — I6522 Occlusion and stenosis of left carotid artery: Secondary | ICD-10-CM | POA: Diagnosis not present

## 2017-01-02 DIAGNOSIS — I6523 Occlusion and stenosis of bilateral carotid arteries: Secondary | ICD-10-CM | POA: Diagnosis not present

## 2017-01-05 DIAGNOSIS — I10 Essential (primary) hypertension: Secondary | ICD-10-CM | POA: Diagnosis not present

## 2017-01-05 DIAGNOSIS — R413 Other amnesia: Secondary | ICD-10-CM | POA: Diagnosis not present

## 2017-01-05 DIAGNOSIS — Z1389 Encounter for screening for other disorder: Secondary | ICD-10-CM | POA: Diagnosis not present

## 2017-02-07 DIAGNOSIS — J309 Allergic rhinitis, unspecified: Secondary | ICD-10-CM | POA: Diagnosis not present

## 2017-02-17 DIAGNOSIS — J111 Influenza due to unidentified influenza virus with other respiratory manifestations: Secondary | ICD-10-CM | POA: Diagnosis not present

## 2017-02-17 DIAGNOSIS — R0902 Hypoxemia: Secondary | ICD-10-CM | POA: Diagnosis not present

## 2017-02-17 DIAGNOSIS — R2241 Localized swelling, mass and lump, right lower limb: Secondary | ICD-10-CM | POA: Diagnosis not present

## 2017-02-17 DIAGNOSIS — R5383 Other fatigue: Secondary | ICD-10-CM | POA: Diagnosis not present

## 2017-02-17 DIAGNOSIS — R0989 Other specified symptoms and signs involving the circulatory and respiratory systems: Secondary | ICD-10-CM | POA: Diagnosis not present

## 2017-02-17 DIAGNOSIS — R05 Cough: Secondary | ICD-10-CM | POA: Diagnosis not present

## 2017-02-17 DIAGNOSIS — R6883 Chills (without fever): Secondary | ICD-10-CM | POA: Diagnosis not present

## 2017-04-17 DIAGNOSIS — I10 Essential (primary) hypertension: Secondary | ICD-10-CM | POA: Diagnosis not present

## 2017-04-17 DIAGNOSIS — R413 Other amnesia: Secondary | ICD-10-CM | POA: Diagnosis not present

## 2017-05-19 DIAGNOSIS — Z6828 Body mass index (BMI) 28.0-28.9, adult: Secondary | ICD-10-CM | POA: Diagnosis not present

## 2017-05-19 DIAGNOSIS — G3184 Mild cognitive impairment, so stated: Secondary | ICD-10-CM | POA: Diagnosis not present

## 2017-05-26 DIAGNOSIS — S61402A Unspecified open wound of left hand, initial encounter: Secondary | ICD-10-CM | POA: Diagnosis not present

## 2017-06-08 DIAGNOSIS — F419 Anxiety disorder, unspecified: Secondary | ICD-10-CM | POA: Diagnosis not present

## 2017-07-17 DIAGNOSIS — Z79899 Other long term (current) drug therapy: Secondary | ICD-10-CM | POA: Diagnosis not present

## 2017-07-17 DIAGNOSIS — F411 Generalized anxiety disorder: Secondary | ICD-10-CM | POA: Diagnosis not present

## 2017-07-17 DIAGNOSIS — Z5181 Encounter for therapeutic drug level monitoring: Secondary | ICD-10-CM | POA: Diagnosis not present

## 2017-07-25 DIAGNOSIS — H26491 Other secondary cataract, right eye: Secondary | ICD-10-CM | POA: Diagnosis not present

## 2017-08-01 DIAGNOSIS — J0100 Acute maxillary sinusitis, unspecified: Secondary | ICD-10-CM | POA: Diagnosis not present

## 2017-08-01 DIAGNOSIS — J209 Acute bronchitis, unspecified: Secondary | ICD-10-CM | POA: Diagnosis not present

## 2017-09-19 DIAGNOSIS — K589 Irritable bowel syndrome without diarrhea: Secondary | ICD-10-CM | POA: Diagnosis not present

## 2017-09-19 DIAGNOSIS — M069 Rheumatoid arthritis, unspecified: Secondary | ICD-10-CM | POA: Diagnosis not present

## 2017-09-19 DIAGNOSIS — Z23 Encounter for immunization: Secondary | ICD-10-CM | POA: Diagnosis not present

## 2017-09-19 DIAGNOSIS — N183 Chronic kidney disease, stage 3 (moderate): Secondary | ICD-10-CM | POA: Diagnosis not present

## 2017-09-19 DIAGNOSIS — I679 Cerebrovascular disease, unspecified: Secondary | ICD-10-CM | POA: Diagnosis not present

## 2017-09-19 DIAGNOSIS — E7800 Pure hypercholesterolemia, unspecified: Secondary | ICD-10-CM | POA: Diagnosis not present

## 2017-09-19 DIAGNOSIS — I10 Essential (primary) hypertension: Secondary | ICD-10-CM | POA: Diagnosis not present

## 2017-09-19 DIAGNOSIS — E0590 Thyrotoxicosis, unspecified without thyrotoxic crisis or storm: Secondary | ICD-10-CM | POA: Diagnosis not present

## 2017-09-19 DIAGNOSIS — E559 Vitamin D deficiency, unspecified: Secondary | ICD-10-CM | POA: Diagnosis not present

## 2017-09-19 DIAGNOSIS — F411 Generalized anxiety disorder: Secondary | ICD-10-CM | POA: Diagnosis not present

## 2017-09-19 DIAGNOSIS — M8580 Other specified disorders of bone density and structure, unspecified site: Secondary | ICD-10-CM | POA: Diagnosis not present

## 2017-09-19 DIAGNOSIS — Z1389 Encounter for screening for other disorder: Secondary | ICD-10-CM | POA: Diagnosis not present

## 2017-10-22 DIAGNOSIS — J0100 Acute maxillary sinusitis, unspecified: Secondary | ICD-10-CM | POA: Diagnosis not present

## 2017-10-24 DIAGNOSIS — R42 Dizziness and giddiness: Secondary | ICD-10-CM | POA: Diagnosis not present

## 2017-10-24 DIAGNOSIS — R079 Chest pain, unspecified: Secondary | ICD-10-CM | POA: Diagnosis not present

## 2017-10-24 DIAGNOSIS — I160 Hypertensive urgency: Secondary | ICD-10-CM | POA: Diagnosis not present

## 2017-10-24 DIAGNOSIS — R51 Headache: Secondary | ICD-10-CM | POA: Diagnosis not present

## 2017-10-30 DIAGNOSIS — J209 Acute bronchitis, unspecified: Secondary | ICD-10-CM | POA: Diagnosis not present

## 2017-10-30 DIAGNOSIS — I10 Essential (primary) hypertension: Secondary | ICD-10-CM | POA: Diagnosis not present

## 2017-11-06 DIAGNOSIS — J40 Bronchitis, not specified as acute or chronic: Secondary | ICD-10-CM | POA: Diagnosis not present

## 2017-11-15 DIAGNOSIS — J302 Other seasonal allergic rhinitis: Secondary | ICD-10-CM | POA: Diagnosis not present

## 2017-11-17 DIAGNOSIS — S2020XA Contusion of thorax, unspecified, initial encounter: Secondary | ICD-10-CM | POA: Diagnosis not present

## 2017-11-17 DIAGNOSIS — S20212A Contusion of left front wall of thorax, initial encounter: Secondary | ICD-10-CM | POA: Diagnosis not present

## 2017-11-17 DIAGNOSIS — S299XXA Unspecified injury of thorax, initial encounter: Secondary | ICD-10-CM | POA: Diagnosis not present

## 2017-11-17 DIAGNOSIS — S279XXA Injury of unspecified intrathoracic organ, initial encounter: Secondary | ICD-10-CM | POA: Diagnosis not present

## 2017-11-17 DIAGNOSIS — F0390 Unspecified dementia without behavioral disturbance: Secondary | ICD-10-CM | POA: Diagnosis not present

## 2017-11-17 DIAGNOSIS — S0990XA Unspecified injury of head, initial encounter: Secondary | ICD-10-CM | POA: Diagnosis not present

## 2017-11-17 DIAGNOSIS — S199XXA Unspecified injury of neck, initial encounter: Secondary | ICD-10-CM | POA: Diagnosis not present

## 2017-12-03 DIAGNOSIS — Z87891 Personal history of nicotine dependence: Secondary | ICD-10-CM | POA: Diagnosis not present

## 2017-12-03 DIAGNOSIS — J181 Lobar pneumonia, unspecified organism: Secondary | ICD-10-CM | POA: Diagnosis not present

## 2017-12-03 DIAGNOSIS — R0602 Shortness of breath: Secondary | ICD-10-CM | POA: Diagnosis not present

## 2017-12-03 DIAGNOSIS — R05 Cough: Secondary | ICD-10-CM | POA: Diagnosis not present

## 2017-12-08 DIAGNOSIS — N183 Chronic kidney disease, stage 3 (moderate): Secondary | ICD-10-CM | POA: Diagnosis not present

## 2017-12-08 DIAGNOSIS — Z87891 Personal history of nicotine dependence: Secondary | ICD-10-CM | POA: Diagnosis not present

## 2017-12-08 DIAGNOSIS — R0602 Shortness of breath: Secondary | ICD-10-CM | POA: Diagnosis not present

## 2017-12-08 DIAGNOSIS — I129 Hypertensive chronic kidney disease with stage 1 through stage 4 chronic kidney disease, or unspecified chronic kidney disease: Secondary | ICD-10-CM | POA: Diagnosis not present

## 2017-12-08 DIAGNOSIS — J45901 Unspecified asthma with (acute) exacerbation: Secondary | ICD-10-CM | POA: Diagnosis not present

## 2017-12-08 DIAGNOSIS — I2510 Atherosclerotic heart disease of native coronary artery without angina pectoris: Secondary | ICD-10-CM | POA: Diagnosis not present

## 2017-12-08 DIAGNOSIS — J45909 Unspecified asthma, uncomplicated: Secondary | ICD-10-CM | POA: Diagnosis not present

## 2017-12-15 DIAGNOSIS — I808 Phlebitis and thrombophlebitis of other sites: Secondary | ICD-10-CM | POA: Diagnosis not present

## 2017-12-20 DIAGNOSIS — J45909 Unspecified asthma, uncomplicated: Secondary | ICD-10-CM | POA: Diagnosis not present

## 2017-12-20 DIAGNOSIS — I10 Essential (primary) hypertension: Secondary | ICD-10-CM | POA: Diagnosis not present

## 2017-12-20 DIAGNOSIS — J209 Acute bronchitis, unspecified: Secondary | ICD-10-CM | POA: Diagnosis not present

## 2017-12-26 DIAGNOSIS — S82402A Unspecified fracture of shaft of left fibula, initial encounter for closed fracture: Secondary | ICD-10-CM | POA: Diagnosis not present

## 2017-12-28 DIAGNOSIS — S8265XA Nondisplaced fracture of lateral malleolus of left fibula, initial encounter for closed fracture: Secondary | ICD-10-CM | POA: Diagnosis not present

## 2018-01-01 DIAGNOSIS — R2689 Other abnormalities of gait and mobility: Secondary | ICD-10-CM | POA: Diagnosis not present

## 2018-01-01 DIAGNOSIS — M25572 Pain in left ankle and joints of left foot: Secondary | ICD-10-CM | POA: Diagnosis not present

## 2018-01-01 DIAGNOSIS — S8265XD Nondisplaced fracture of lateral malleolus of left fibula, subsequent encounter for closed fracture with routine healing: Secondary | ICD-10-CM | POA: Diagnosis not present

## 2018-01-01 DIAGNOSIS — M6281 Muscle weakness (generalized): Secondary | ICD-10-CM | POA: Diagnosis not present

## 2018-01-04 DIAGNOSIS — R062 Wheezing: Secondary | ICD-10-CM | POA: Diagnosis not present

## 2018-01-04 DIAGNOSIS — J441 Chronic obstructive pulmonary disease with (acute) exacerbation: Secondary | ICD-10-CM | POA: Diagnosis not present

## 2018-01-04 DIAGNOSIS — R069 Unspecified abnormalities of breathing: Secondary | ICD-10-CM | POA: Diagnosis not present

## 2018-01-04 DIAGNOSIS — R0602 Shortness of breath: Secondary | ICD-10-CM | POA: Diagnosis not present

## 2018-01-04 DIAGNOSIS — R0600 Dyspnea, unspecified: Secondary | ICD-10-CM | POA: Diagnosis not present

## 2018-01-08 DIAGNOSIS — S8265XA Nondisplaced fracture of lateral malleolus of left fibula, initial encounter for closed fracture: Secondary | ICD-10-CM | POA: Diagnosis not present

## 2018-01-09 DIAGNOSIS — M25572 Pain in left ankle and joints of left foot: Secondary | ICD-10-CM | POA: Diagnosis not present

## 2018-01-09 DIAGNOSIS — S8265XD Nondisplaced fracture of lateral malleolus of left fibula, subsequent encounter for closed fracture with routine healing: Secondary | ICD-10-CM | POA: Diagnosis not present

## 2018-01-09 DIAGNOSIS — R2689 Other abnormalities of gait and mobility: Secondary | ICD-10-CM | POA: Diagnosis not present

## 2018-01-09 DIAGNOSIS — M6281 Muscle weakness (generalized): Secondary | ICD-10-CM | POA: Diagnosis not present

## 2018-01-10 DIAGNOSIS — R102 Pelvic and perineal pain: Secondary | ICD-10-CM | POA: Diagnosis not present

## 2018-01-10 DIAGNOSIS — S3993XA Unspecified injury of pelvis, initial encounter: Secondary | ICD-10-CM | POA: Diagnosis not present

## 2018-01-10 DIAGNOSIS — M5136 Other intervertebral disc degeneration, lumbar region: Secondary | ICD-10-CM | POA: Diagnosis not present

## 2018-01-10 DIAGNOSIS — S3992XA Unspecified injury of lower back, initial encounter: Secondary | ICD-10-CM | POA: Diagnosis not present

## 2018-01-10 DIAGNOSIS — M533 Sacrococcygeal disorders, not elsewhere classified: Secondary | ICD-10-CM | POA: Diagnosis not present

## 2018-01-10 DIAGNOSIS — M545 Low back pain: Secondary | ICD-10-CM | POA: Diagnosis not present

## 2018-01-10 DIAGNOSIS — Z981 Arthrodesis status: Secondary | ICD-10-CM | POA: Diagnosis not present

## 2018-01-17 DIAGNOSIS — R2689 Other abnormalities of gait and mobility: Secondary | ICD-10-CM | POA: Diagnosis not present

## 2018-01-17 DIAGNOSIS — S8265XD Nondisplaced fracture of lateral malleolus of left fibula, subsequent encounter for closed fracture with routine healing: Secondary | ICD-10-CM | POA: Diagnosis not present

## 2018-01-17 DIAGNOSIS — M25572 Pain in left ankle and joints of left foot: Secondary | ICD-10-CM | POA: Diagnosis not present

## 2018-01-17 DIAGNOSIS — M6281 Muscle weakness (generalized): Secondary | ICD-10-CM | POA: Diagnosis not present

## 2018-01-24 DIAGNOSIS — S8265XD Nondisplaced fracture of lateral malleolus of left fibula, subsequent encounter for closed fracture with routine healing: Secondary | ICD-10-CM | POA: Diagnosis not present

## 2018-01-31 DIAGNOSIS — E2839 Other primary ovarian failure: Secondary | ICD-10-CM | POA: Diagnosis not present

## 2018-01-31 DIAGNOSIS — M810 Age-related osteoporosis without current pathological fracture: Secondary | ICD-10-CM | POA: Diagnosis not present

## 2018-02-14 DIAGNOSIS — S8265XD Nondisplaced fracture of lateral malleolus of left fibula, subsequent encounter for closed fracture with routine healing: Secondary | ICD-10-CM | POA: Diagnosis not present

## 2018-03-15 DIAGNOSIS — I10 Essential (primary) hypertension: Secondary | ICD-10-CM | POA: Diagnosis not present

## 2018-03-15 DIAGNOSIS — M810 Age-related osteoporosis without current pathological fracture: Secondary | ICD-10-CM | POA: Diagnosis not present

## 2018-03-15 DIAGNOSIS — E559 Vitamin D deficiency, unspecified: Secondary | ICD-10-CM | POA: Diagnosis not present

## 2018-03-21 DIAGNOSIS — S8265XD Nondisplaced fracture of lateral malleolus of left fibula, subsequent encounter for closed fracture with routine healing: Secondary | ICD-10-CM | POA: Diagnosis not present

## 2018-06-22 DIAGNOSIS — E669 Obesity, unspecified: Secondary | ICD-10-CM | POA: Diagnosis not present

## 2018-06-22 DIAGNOSIS — Z1389 Encounter for screening for other disorder: Secondary | ICD-10-CM | POA: Diagnosis not present

## 2018-06-22 DIAGNOSIS — N183 Chronic kidney disease, stage 3 (moderate): Secondary | ICD-10-CM | POA: Diagnosis not present

## 2018-06-22 DIAGNOSIS — Z6831 Body mass index (BMI) 31.0-31.9, adult: Secondary | ICD-10-CM | POA: Diagnosis not present

## 2018-06-22 DIAGNOSIS — I679 Cerebrovascular disease, unspecified: Secondary | ICD-10-CM | POA: Diagnosis not present

## 2018-07-27 ENCOUNTER — Other Ambulatory Visit: Payer: Self-pay

## 2018-08-02 DIAGNOSIS — H3530 Unspecified macular degeneration: Secondary | ICD-10-CM | POA: Diagnosis not present

## 2018-08-02 DIAGNOSIS — H35373 Puckering of macula, bilateral: Secondary | ICD-10-CM | POA: Diagnosis not present

## 2018-08-02 DIAGNOSIS — H26492 Other secondary cataract, left eye: Secondary | ICD-10-CM | POA: Diagnosis not present

## 2018-08-02 DIAGNOSIS — H5010 Unspecified exotropia: Secondary | ICD-10-CM | POA: Diagnosis not present

## 2018-08-08 DIAGNOSIS — M79671 Pain in right foot: Secondary | ICD-10-CM | POA: Diagnosis not present

## 2018-09-27 DIAGNOSIS — Z23 Encounter for immunization: Secondary | ICD-10-CM | POA: Diagnosis not present

## 2018-10-12 DIAGNOSIS — I10 Essential (primary) hypertension: Secondary | ICD-10-CM | POA: Diagnosis not present

## 2018-10-23 DIAGNOSIS — G4733 Obstructive sleep apnea (adult) (pediatric): Secondary | ICD-10-CM | POA: Diagnosis not present

## 2018-10-23 DIAGNOSIS — E559 Vitamin D deficiency, unspecified: Secondary | ICD-10-CM | POA: Diagnosis not present

## 2018-10-23 DIAGNOSIS — G2581 Restless legs syndrome: Secondary | ICD-10-CM | POA: Diagnosis not present

## 2018-10-23 DIAGNOSIS — R5383 Other fatigue: Secondary | ICD-10-CM | POA: Diagnosis not present

## 2018-10-23 DIAGNOSIS — J4540 Moderate persistent asthma, uncomplicated: Secondary | ICD-10-CM | POA: Diagnosis not present

## 2018-10-23 DIAGNOSIS — J301 Allergic rhinitis due to pollen: Secondary | ICD-10-CM | POA: Diagnosis not present

## 2018-11-11 DIAGNOSIS — G4733 Obstructive sleep apnea (adult) (pediatric): Secondary | ICD-10-CM | POA: Diagnosis not present

## 2018-11-14 DIAGNOSIS — H52221 Regular astigmatism, right eye: Secondary | ICD-10-CM | POA: Diagnosis not present

## 2018-11-14 DIAGNOSIS — I10 Essential (primary) hypertension: Secondary | ICD-10-CM | POA: Diagnosis not present

## 2018-11-14 DIAGNOSIS — H5203 Hypermetropia, bilateral: Secondary | ICD-10-CM | POA: Diagnosis not present

## 2018-11-14 DIAGNOSIS — H524 Presbyopia: Secondary | ICD-10-CM | POA: Diagnosis not present

## 2018-11-14 DIAGNOSIS — Z9841 Cataract extraction status, right eye: Secondary | ICD-10-CM | POA: Diagnosis not present

## 2018-11-14 DIAGNOSIS — H353131 Nonexudative age-related macular degeneration, bilateral, early dry stage: Secondary | ICD-10-CM | POA: Diagnosis not present

## 2018-11-14 DIAGNOSIS — Z961 Presence of intraocular lens: Secondary | ICD-10-CM | POA: Diagnosis not present

## 2018-11-14 DIAGNOSIS — Z9842 Cataract extraction status, left eye: Secondary | ICD-10-CM | POA: Diagnosis not present

## 2018-11-15 DIAGNOSIS — J301 Allergic rhinitis due to pollen: Secondary | ICD-10-CM | POA: Diagnosis not present

## 2018-11-15 DIAGNOSIS — G2581 Restless legs syndrome: Secondary | ICD-10-CM | POA: Diagnosis not present

## 2018-11-15 DIAGNOSIS — R5383 Other fatigue: Secondary | ICD-10-CM | POA: Diagnosis not present

## 2018-11-15 DIAGNOSIS — G4733 Obstructive sleep apnea (adult) (pediatric): Secondary | ICD-10-CM | POA: Diagnosis not present

## 2018-11-15 DIAGNOSIS — J4540 Moderate persistent asthma, uncomplicated: Secondary | ICD-10-CM | POA: Diagnosis not present

## 2018-11-26 DIAGNOSIS — G4733 Obstructive sleep apnea (adult) (pediatric): Secondary | ICD-10-CM | POA: Diagnosis not present

## 2018-11-30 DIAGNOSIS — J301 Allergic rhinitis due to pollen: Secondary | ICD-10-CM | POA: Diagnosis not present

## 2018-11-30 DIAGNOSIS — G2581 Restless legs syndrome: Secondary | ICD-10-CM | POA: Diagnosis not present

## 2018-11-30 DIAGNOSIS — J4540 Moderate persistent asthma, uncomplicated: Secondary | ICD-10-CM | POA: Diagnosis not present

## 2018-11-30 DIAGNOSIS — G4733 Obstructive sleep apnea (adult) (pediatric): Secondary | ICD-10-CM | POA: Diagnosis not present

## 2018-11-30 DIAGNOSIS — R5383 Other fatigue: Secondary | ICD-10-CM | POA: Diagnosis not present

## 2019-01-01 DIAGNOSIS — G4733 Obstructive sleep apnea (adult) (pediatric): Secondary | ICD-10-CM | POA: Diagnosis not present

## 2019-01-13 DIAGNOSIS — J324 Chronic pansinusitis: Secondary | ICD-10-CM | POA: Diagnosis not present

## 2019-01-18 DIAGNOSIS — N390 Urinary tract infection, site not specified: Secondary | ICD-10-CM | POA: Diagnosis not present

## 2019-01-18 DIAGNOSIS — F411 Generalized anxiety disorder: Secondary | ICD-10-CM | POA: Diagnosis not present

## 2019-01-18 DIAGNOSIS — I10 Essential (primary) hypertension: Secondary | ICD-10-CM | POA: Diagnosis not present

## 2019-01-18 DIAGNOSIS — R413 Other amnesia: Secondary | ICD-10-CM | POA: Diagnosis not present

## 2019-01-18 DIAGNOSIS — F330 Major depressive disorder, recurrent, mild: Secondary | ICD-10-CM | POA: Diagnosis not present

## 2019-01-30 DIAGNOSIS — R05 Cough: Secondary | ICD-10-CM | POA: Diagnosis not present

## 2019-01-30 DIAGNOSIS — R0989 Other specified symptoms and signs involving the circulatory and respiratory systems: Secondary | ICD-10-CM | POA: Diagnosis not present

## 2019-02-04 DIAGNOSIS — B9689 Other specified bacterial agents as the cause of diseases classified elsewhere: Secondary | ICD-10-CM | POA: Diagnosis not present

## 2019-02-04 DIAGNOSIS — I959 Hypotension, unspecified: Secondary | ICD-10-CM | POA: Diagnosis not present

## 2019-02-04 DIAGNOSIS — J019 Acute sinusitis, unspecified: Secondary | ICD-10-CM | POA: Diagnosis not present

## 2019-02-04 DIAGNOSIS — Z79899 Other long term (current) drug therapy: Secondary | ICD-10-CM | POA: Diagnosis not present

## 2019-02-04 DIAGNOSIS — R531 Weakness: Secondary | ICD-10-CM | POA: Diagnosis not present

## 2019-02-04 DIAGNOSIS — N183 Chronic kidney disease, stage 3 (moderate): Secondary | ICD-10-CM | POA: Diagnosis not present

## 2019-02-04 DIAGNOSIS — R05 Cough: Secondary | ICD-10-CM | POA: Diagnosis not present

## 2019-02-04 DIAGNOSIS — Z87891 Personal history of nicotine dependence: Secondary | ICD-10-CM | POA: Diagnosis not present

## 2019-02-04 DIAGNOSIS — Z7982 Long term (current) use of aspirin: Secondary | ICD-10-CM | POA: Diagnosis not present

## 2019-02-04 DIAGNOSIS — R5381 Other malaise: Secondary | ICD-10-CM | POA: Diagnosis not present

## 2019-02-04 DIAGNOSIS — I69311 Memory deficit following cerebral infarction: Secondary | ICD-10-CM | POA: Diagnosis not present

## 2019-02-04 DIAGNOSIS — I129 Hypertensive chronic kidney disease with stage 1 through stage 4 chronic kidney disease, or unspecified chronic kidney disease: Secondary | ICD-10-CM | POA: Diagnosis not present

## 2019-02-12 DIAGNOSIS — G4733 Obstructive sleep apnea (adult) (pediatric): Secondary | ICD-10-CM | POA: Diagnosis not present

## 2019-02-12 DIAGNOSIS — G2581 Restless legs syndrome: Secondary | ICD-10-CM | POA: Diagnosis not present

## 2019-02-12 DIAGNOSIS — J301 Allergic rhinitis due to pollen: Secondary | ICD-10-CM | POA: Diagnosis not present

## 2019-02-12 DIAGNOSIS — R5383 Other fatigue: Secondary | ICD-10-CM | POA: Diagnosis not present

## 2019-02-12 DIAGNOSIS — J4540 Moderate persistent asthma, uncomplicated: Secondary | ICD-10-CM | POA: Diagnosis not present

## 2019-04-07 DIAGNOSIS — R410 Disorientation, unspecified: Secondary | ICD-10-CM | POA: Diagnosis not present

## 2019-04-07 DIAGNOSIS — I651 Occlusion and stenosis of basilar artery: Secondary | ICD-10-CM | POA: Diagnosis not present

## 2019-04-07 DIAGNOSIS — I771 Stricture of artery: Secondary | ICD-10-CM | POA: Diagnosis not present

## 2019-04-07 DIAGNOSIS — R319 Hematuria, unspecified: Secondary | ICD-10-CM | POA: Diagnosis not present

## 2019-04-07 DIAGNOSIS — I10 Essential (primary) hypertension: Secondary | ICD-10-CM | POA: Diagnosis not present

## 2019-04-07 DIAGNOSIS — R32 Unspecified urinary incontinence: Secondary | ICD-10-CM | POA: Diagnosis not present

## 2019-04-07 DIAGNOSIS — I729 Aneurysm of unspecified site: Secondary | ICD-10-CM | POA: Diagnosis not present

## 2019-04-07 DIAGNOSIS — I6322 Cerebral infarction due to unspecified occlusion or stenosis of basilar arteries: Secondary | ICD-10-CM | POA: Diagnosis not present

## 2019-04-07 DIAGNOSIS — N390 Urinary tract infection, site not specified: Secondary | ICD-10-CM | POA: Diagnosis not present

## 2019-04-07 DIAGNOSIS — R4182 Altered mental status, unspecified: Secondary | ICD-10-CM | POA: Diagnosis not present

## 2019-04-07 DIAGNOSIS — G459 Transient cerebral ischemic attack, unspecified: Secondary | ICD-10-CM | POA: Diagnosis not present

## 2019-04-07 DIAGNOSIS — I7770 Dissection of unspecified artery: Secondary | ICD-10-CM | POA: Diagnosis not present

## 2019-04-07 DIAGNOSIS — Z8673 Personal history of transient ischemic attack (TIA), and cerebral infarction without residual deficits: Secondary | ICD-10-CM | POA: Diagnosis not present

## 2019-04-08 DIAGNOSIS — K219 Gastro-esophageal reflux disease without esophagitis: Secondary | ICD-10-CM | POA: Diagnosis not present

## 2019-04-08 DIAGNOSIS — I7789 Other specified disorders of arteries and arterioles: Secondary | ICD-10-CM | POA: Diagnosis not present

## 2019-04-08 DIAGNOSIS — F419 Anxiety disorder, unspecified: Secondary | ICD-10-CM | POA: Diagnosis not present

## 2019-04-08 DIAGNOSIS — R269 Unspecified abnormalities of gait and mobility: Secondary | ICD-10-CM | POA: Diagnosis not present

## 2019-04-08 DIAGNOSIS — I671 Cerebral aneurysm, nonruptured: Secondary | ICD-10-CM | POA: Diagnosis not present

## 2019-04-08 DIAGNOSIS — M7989 Other specified soft tissue disorders: Secondary | ICD-10-CM | POA: Diagnosis not present

## 2019-04-08 DIAGNOSIS — Z8673 Personal history of transient ischemic attack (TIA), and cerebral infarction without residual deficits: Secondary | ICD-10-CM | POA: Diagnosis not present

## 2019-04-08 DIAGNOSIS — R0902 Hypoxemia: Secondary | ICD-10-CM | POA: Diagnosis not present

## 2019-04-08 DIAGNOSIS — E878 Other disorders of electrolyte and fluid balance, not elsewhere classified: Secondary | ICD-10-CM | POA: Diagnosis not present

## 2019-04-08 DIAGNOSIS — D649 Anemia, unspecified: Secondary | ICD-10-CM | POA: Diagnosis not present

## 2019-04-08 DIAGNOSIS — N179 Acute kidney failure, unspecified: Secondary | ICD-10-CM | POA: Diagnosis not present

## 2019-04-08 DIAGNOSIS — I6389 Other cerebral infarction: Secondary | ICD-10-CM | POA: Diagnosis not present

## 2019-04-08 DIAGNOSIS — M810 Age-related osteoporosis without current pathological fracture: Secondary | ICD-10-CM | POA: Diagnosis not present

## 2019-04-08 DIAGNOSIS — R109 Unspecified abdominal pain: Secondary | ICD-10-CM | POA: Diagnosis not present

## 2019-04-08 DIAGNOSIS — M6281 Muscle weakness (generalized): Secondary | ICD-10-CM | POA: Diagnosis not present

## 2019-04-08 DIAGNOSIS — I110 Hypertensive heart disease with heart failure: Secondary | ICD-10-CM | POA: Diagnosis not present

## 2019-04-08 DIAGNOSIS — Z7902 Long term (current) use of antithrombotics/antiplatelets: Secondary | ICD-10-CM | POA: Diagnosis not present

## 2019-04-08 DIAGNOSIS — J309 Allergic rhinitis, unspecified: Secondary | ICD-10-CM | POA: Diagnosis not present

## 2019-04-08 DIAGNOSIS — E162 Hypoglycemia, unspecified: Secondary | ICD-10-CM | POA: Diagnosis not present

## 2019-04-08 DIAGNOSIS — R41841 Cognitive communication deficit: Secondary | ICD-10-CM | POA: Diagnosis not present

## 2019-04-08 DIAGNOSIS — I129 Hypertensive chronic kidney disease with stage 1 through stage 4 chronic kidney disease, or unspecified chronic kidney disease: Secondary | ICD-10-CM | POA: Diagnosis not present

## 2019-04-08 DIAGNOSIS — F0390 Unspecified dementia without behavioral disturbance: Secondary | ICD-10-CM | POA: Diagnosis not present

## 2019-04-08 DIAGNOSIS — R29701 NIHSS score 1: Secondary | ICD-10-CM | POA: Diagnosis present

## 2019-04-08 DIAGNOSIS — N390 Urinary tract infection, site not specified: Secondary | ICD-10-CM | POA: Diagnosis not present

## 2019-04-08 DIAGNOSIS — F0631 Mood disorder due to known physiological condition with depressive features: Secondary | ICD-10-CM | POA: Diagnosis not present

## 2019-04-08 DIAGNOSIS — I6322 Cerebral infarction due to unspecified occlusion or stenosis of basilar arteries: Secondary | ICD-10-CM | POA: Diagnosis not present

## 2019-04-08 DIAGNOSIS — I7775 Dissection of other precerebral arteries: Secondary | ICD-10-CM | POA: Diagnosis not present

## 2019-04-08 DIAGNOSIS — R279 Unspecified lack of coordination: Secondary | ICD-10-CM | POA: Diagnosis not present

## 2019-04-08 DIAGNOSIS — E785 Hyperlipidemia, unspecified: Secondary | ICD-10-CM | POA: Diagnosis not present

## 2019-04-08 DIAGNOSIS — R4182 Altered mental status, unspecified: Secondary | ICD-10-CM | POA: Diagnosis not present

## 2019-04-08 DIAGNOSIS — N189 Chronic kidney disease, unspecified: Secondary | ICD-10-CM | POA: Diagnosis not present

## 2019-04-08 DIAGNOSIS — E7800 Pure hypercholesterolemia, unspecified: Secondary | ICD-10-CM | POA: Diagnosis not present

## 2019-04-08 DIAGNOSIS — I739 Peripheral vascular disease, unspecified: Secondary | ICD-10-CM | POA: Diagnosis not present

## 2019-04-08 DIAGNOSIS — I10 Essential (primary) hypertension: Secondary | ICD-10-CM | POA: Diagnosis not present

## 2019-04-08 DIAGNOSIS — R262 Difficulty in walking, not elsewhere classified: Secondary | ICD-10-CM | POA: Diagnosis not present

## 2019-04-08 DIAGNOSIS — I6302 Cerebral infarction due to thrombosis of basilar artery: Secondary | ICD-10-CM | POA: Diagnosis not present

## 2019-04-08 DIAGNOSIS — J449 Chronic obstructive pulmonary disease, unspecified: Secondary | ICD-10-CM | POA: Diagnosis not present

## 2019-04-08 DIAGNOSIS — I651 Occlusion and stenosis of basilar artery: Secondary | ICD-10-CM | POA: Diagnosis not present

## 2019-04-08 DIAGNOSIS — R278 Other lack of coordination: Secondary | ICD-10-CM | POA: Diagnosis not present

## 2019-04-08 DIAGNOSIS — Z7982 Long term (current) use of aspirin: Secondary | ICD-10-CM | POA: Diagnosis not present

## 2019-04-08 DIAGNOSIS — Z741 Need for assistance with personal care: Secondary | ICD-10-CM | POA: Diagnosis not present

## 2019-04-08 DIAGNOSIS — I959 Hypotension, unspecified: Secondary | ICD-10-CM | POA: Diagnosis not present

## 2019-04-08 DIAGNOSIS — G4730 Sleep apnea, unspecified: Secondary | ICD-10-CM | POA: Diagnosis not present

## 2019-04-08 DIAGNOSIS — D72829 Elevated white blood cell count, unspecified: Secondary | ICD-10-CM | POA: Diagnosis not present

## 2019-04-08 DIAGNOSIS — R633 Feeding difficulties: Secondary | ICD-10-CM | POA: Diagnosis not present

## 2019-04-08 DIAGNOSIS — F329 Major depressive disorder, single episode, unspecified: Secondary | ICD-10-CM | POA: Diagnosis not present

## 2019-04-08 DIAGNOSIS — I69020 Aphasia following nontraumatic subarachnoid hemorrhage: Secondary | ICD-10-CM | POA: Diagnosis not present

## 2019-04-08 DIAGNOSIS — R1310 Dysphagia, unspecified: Secondary | ICD-10-CM | POA: Diagnosis not present

## 2019-04-08 DIAGNOSIS — B9689 Other specified bacterial agents as the cause of diseases classified elsewhere: Secondary | ICD-10-CM | POA: Diagnosis not present

## 2019-04-08 DIAGNOSIS — Z743 Need for continuous supervision: Secondary | ICD-10-CM | POA: Diagnosis not present

## 2019-04-17 DIAGNOSIS — I639 Cerebral infarction, unspecified: Secondary | ICD-10-CM | POA: Diagnosis not present

## 2019-04-17 DIAGNOSIS — J449 Chronic obstructive pulmonary disease, unspecified: Secondary | ICD-10-CM | POA: Diagnosis not present

## 2019-04-17 DIAGNOSIS — N179 Acute kidney failure, unspecified: Secondary | ICD-10-CM | POA: Diagnosis not present

## 2019-04-17 DIAGNOSIS — K219 Gastro-esophageal reflux disease without esophagitis: Secondary | ICD-10-CM | POA: Diagnosis not present

## 2019-04-17 DIAGNOSIS — I119 Hypertensive heart disease without heart failure: Secondary | ICD-10-CM | POA: Diagnosis not present

## 2019-04-17 DIAGNOSIS — M810 Age-related osteoporosis without current pathological fracture: Secondary | ICD-10-CM | POA: Diagnosis not present

## 2019-04-17 DIAGNOSIS — I10 Essential (primary) hypertension: Secondary | ICD-10-CM | POA: Diagnosis not present

## 2019-04-17 DIAGNOSIS — E785 Hyperlipidemia, unspecified: Secondary | ICD-10-CM | POA: Diagnosis not present

## 2019-04-17 DIAGNOSIS — F329 Major depressive disorder, single episode, unspecified: Secondary | ICD-10-CM | POA: Diagnosis not present

## 2019-04-17 DIAGNOSIS — I739 Peripheral vascular disease, unspecified: Secondary | ICD-10-CM | POA: Diagnosis not present

## 2019-04-17 DIAGNOSIS — R269 Unspecified abnormalities of gait and mobility: Secondary | ICD-10-CM | POA: Diagnosis not present

## 2019-04-17 DIAGNOSIS — R279 Unspecified lack of coordination: Secondary | ICD-10-CM | POA: Diagnosis not present

## 2019-04-17 DIAGNOSIS — F0390 Unspecified dementia without behavioral disturbance: Secondary | ICD-10-CM | POA: Diagnosis not present

## 2019-04-17 DIAGNOSIS — F0631 Mood disorder due to known physiological condition with depressive features: Secondary | ICD-10-CM | POA: Diagnosis not present

## 2019-04-17 DIAGNOSIS — F419 Anxiety disorder, unspecified: Secondary | ICD-10-CM | POA: Diagnosis not present

## 2019-04-17 DIAGNOSIS — R0902 Hypoxemia: Secondary | ICD-10-CM | POA: Diagnosis not present

## 2019-04-17 DIAGNOSIS — R262 Difficulty in walking, not elsewhere classified: Secondary | ICD-10-CM | POA: Diagnosis not present

## 2019-04-17 DIAGNOSIS — Z741 Need for assistance with personal care: Secondary | ICD-10-CM | POA: Diagnosis not present

## 2019-04-17 DIAGNOSIS — Z743 Need for continuous supervision: Secondary | ICD-10-CM | POA: Diagnosis not present

## 2019-04-17 DIAGNOSIS — Z8673 Personal history of transient ischemic attack (TIA), and cerebral infarction without residual deficits: Secondary | ICD-10-CM | POA: Diagnosis not present

## 2019-04-17 DIAGNOSIS — I6302 Cerebral infarction due to thrombosis of basilar artery: Secondary | ICD-10-CM | POA: Diagnosis not present

## 2019-04-17 DIAGNOSIS — D649 Anemia, unspecified: Secondary | ICD-10-CM | POA: Diagnosis not present

## 2019-04-17 DIAGNOSIS — N390 Urinary tract infection, site not specified: Secondary | ICD-10-CM | POA: Diagnosis not present

## 2019-04-17 DIAGNOSIS — Z7901 Long term (current) use of anticoagulants: Secondary | ICD-10-CM | POA: Diagnosis not present

## 2019-04-17 DIAGNOSIS — R278 Other lack of coordination: Secondary | ICD-10-CM | POA: Diagnosis not present

## 2019-04-17 DIAGNOSIS — I7775 Dissection of other precerebral arteries: Secondary | ICD-10-CM | POA: Diagnosis not present

## 2019-04-17 DIAGNOSIS — Z7982 Long term (current) use of aspirin: Secondary | ICD-10-CM | POA: Diagnosis not present

## 2019-04-17 DIAGNOSIS — I6322 Cerebral infarction due to unspecified occlusion or stenosis of basilar arteries: Secondary | ICD-10-CM | POA: Diagnosis not present

## 2019-04-17 DIAGNOSIS — E162 Hypoglycemia, unspecified: Secondary | ICD-10-CM | POA: Diagnosis not present

## 2019-04-17 DIAGNOSIS — I959 Hypotension, unspecified: Secondary | ICD-10-CM | POA: Diagnosis not present

## 2019-04-17 DIAGNOSIS — N189 Chronic kidney disease, unspecified: Secondary | ICD-10-CM | POA: Diagnosis not present

## 2019-04-17 DIAGNOSIS — R41841 Cognitive communication deficit: Secondary | ICD-10-CM | POA: Diagnosis not present

## 2019-04-17 DIAGNOSIS — M6281 Muscle weakness (generalized): Secondary | ICD-10-CM | POA: Diagnosis not present

## 2019-04-17 DIAGNOSIS — J309 Allergic rhinitis, unspecified: Secondary | ICD-10-CM | POA: Diagnosis not present

## 2019-04-17 DIAGNOSIS — E7800 Pure hypercholesterolemia, unspecified: Secondary | ICD-10-CM | POA: Diagnosis not present

## 2019-04-17 DIAGNOSIS — G4730 Sleep apnea, unspecified: Secondary | ICD-10-CM | POA: Diagnosis not present

## 2019-04-19 DIAGNOSIS — E785 Hyperlipidemia, unspecified: Secondary | ICD-10-CM | POA: Diagnosis not present

## 2019-04-19 DIAGNOSIS — R262 Difficulty in walking, not elsewhere classified: Secondary | ICD-10-CM | POA: Diagnosis not present

## 2019-04-19 DIAGNOSIS — I639 Cerebral infarction, unspecified: Secondary | ICD-10-CM | POA: Diagnosis not present

## 2019-04-19 DIAGNOSIS — I119 Hypertensive heart disease without heart failure: Secondary | ICD-10-CM | POA: Diagnosis not present

## 2019-04-23 DIAGNOSIS — I6322 Cerebral infarction due to unspecified occlusion or stenosis of basilar arteries: Secondary | ICD-10-CM | POA: Diagnosis not present

## 2019-04-23 DIAGNOSIS — Z7901 Long term (current) use of anticoagulants: Secondary | ICD-10-CM | POA: Diagnosis not present

## 2019-04-23 DIAGNOSIS — I10 Essential (primary) hypertension: Secondary | ICD-10-CM | POA: Diagnosis not present

## 2019-04-26 ENCOUNTER — Other Ambulatory Visit: Payer: Self-pay | Admitting: *Deleted

## 2019-04-26 NOTE — Patient Outreach (Signed)
[place] [facility]) Care Management  04/26/2019  [name] [name] 02/14/1941 [id number]    Member is admitted to [facility] SN[facility] per [place]. There is no information in epic chart on member.   Verified on APL and in [place] PCP is listed as Dr. Nona Dell with [facility].  According to [facility]uity [facility] UM documentation platform), member lives alone with occasional support of her son. Appears disposition plan is for home with home health.   Per acuity, member has a medical history of CVA, HTN, HLD, anemia, CKD, UTI, depression, anxiety, dementia.   Will follow up with [facility] UM RN regarding [place] Management needs as a benefit of members [employer] Medicare insurance.  Will plan to make [place] Management referral upon SNF discharge if member returns home.  Noted member's telephone number is [phone] in [place] All Payers List.   Marthenia Rolling, MSN-Ed, RN,BSN [place] Acute Care Coordinator [phone]

## 2019-05-06 ENCOUNTER — Other Ambulatory Visit: Payer: Self-pay | Admitting: *Deleted

## 2019-05-06 NOTE — Patient Outreach (Signed)
[place] [facility]) Care Management  05/06/2019  Keiva Dina October 01, 1941 [id number]    Per PatientPing member remains at [facility]. Verified in Acuity that member likely to discharge home with 24 hr caregiver assistance on 05/07/19.  Telephone call made to [phone] on record. Spoke with patient's daughter in law Apurva Reily. Butch Penny provided patient identifiers. Explained [place] Management program services. Butch Penny states she is a Marine scientist and is familiar with [place]. Butch Penny states member can use the additional [facility] Care Management support. Butch Penny states " we are not really sure how she really is until we get her home. All we have been able to see is her thru the window " (due to pandemic facility restrictions).   Butch Penny endorses that she is the primary contact person for Mrs. Amedeo Plenty. States member has dementia and she and her husband (member's son) are the primary caregivers.  Butch Penny reports member will return home with daytime caregivers while Butch Penny and her husband are working during the day. States member will have a family member stay with her in the evening after they get off of work.   Butch Penny states the long term plan is for member to to go the Adult Day Care in [place] when it opens up. Butch Penny is also agreeable to [facility] LCSW follow up to assist with navigating this process.   Butch Penny also agreeable to [place] follow up. Member has a medical history of CVA, HTN, HLD, andmia, CKD, UTI, anxiety, depression, and dementia per acuity [facility] UM documentation system).   Confirmed member's address as [address] [place], [place] [address].  Primary Care Provider is Dr. Vassie Loll Eyk with [facility].  Confirmed best number is [phone]- daughter in law- Brightyn Mozer.   Will plan to make referral to [place] and Rafael Capo LCSW once disposition date is confirmed.   Will continue to collaborate with [facility] UM RN on this member.    Marthenia Rolling, MSN-Ed,  RN,BSN [place] Acute Care Coordinator [phone]

## 2019-05-08 ENCOUNTER — Other Ambulatory Visit: Payer: Self-pay | Admitting: *Deleted

## 2019-05-08 DIAGNOSIS — I10 Essential (primary) hypertension: Secondary | ICD-10-CM

## 2019-05-08 NOTE — Patient Outreach (Signed)
[place] [facility]) Care Management  05/08/2019  [name] [name] 1941-07-08 [id number]   Verified in [place] and acuity that member discharged home from [facility] on 05/07/19. Member will have [facility] and caregiver assistance.   Previously spoke with member's daughter in law- Tykisha Areola at [phone], who was agreeable to [employer] Management follow up for [facility] CM RN and [facility] LCSW. Butch Penny is primary contact as member has dementia.  Will make referral to [place] and [facility] LCSW.   Please see writer's notes on 05/06/19 for additional details.   Marthenia Rolling, MSN-Ed, RN,BSN [place] Acute Care Coordinator [phone]

## 2019-05-09 ENCOUNTER — Other Ambulatory Visit: Payer: Self-pay

## 2019-05-09 ENCOUNTER — Encounter: Payer: Self-pay | Admitting: *Deleted

## 2019-05-09 ENCOUNTER — Other Ambulatory Visit: Payer: Self-pay | Admitting: *Deleted

## 2019-05-09 DIAGNOSIS — K219 Gastro-esophageal reflux disease without esophagitis: Secondary | ICD-10-CM | POA: Diagnosis not present

## 2019-05-09 DIAGNOSIS — I129 Hypertensive chronic kidney disease with stage 1 through stage 4 chronic kidney disease, or unspecified chronic kidney disease: Secondary | ICD-10-CM | POA: Diagnosis not present

## 2019-05-09 DIAGNOSIS — F329 Major depressive disorder, single episode, unspecified: Secondary | ICD-10-CM | POA: Diagnosis not present

## 2019-05-09 DIAGNOSIS — J449 Chronic obstructive pulmonary disease, unspecified: Secondary | ICD-10-CM | POA: Diagnosis not present

## 2019-05-09 DIAGNOSIS — N189 Chronic kidney disease, unspecified: Secondary | ICD-10-CM | POA: Diagnosis not present

## 2019-05-09 DIAGNOSIS — F419 Anxiety disorder, unspecified: Secondary | ICD-10-CM | POA: Diagnosis not present

## 2019-05-09 DIAGNOSIS — R41841 Cognitive communication deficit: Secondary | ICD-10-CM | POA: Diagnosis not present

## 2019-05-09 DIAGNOSIS — N179 Acute kidney failure, unspecified: Secondary | ICD-10-CM | POA: Diagnosis not present

## 2019-05-09 DIAGNOSIS — M810 Age-related osteoporosis without current pathological fracture: Secondary | ICD-10-CM | POA: Diagnosis not present

## 2019-05-09 DIAGNOSIS — E785 Hyperlipidemia, unspecified: Secondary | ICD-10-CM | POA: Diagnosis not present

## 2019-05-09 DIAGNOSIS — Z87440 Personal history of urinary (tract) infections: Secondary | ICD-10-CM | POA: Diagnosis not present

## 2019-05-09 DIAGNOSIS — G4730 Sleep apnea, unspecified: Secondary | ICD-10-CM | POA: Diagnosis not present

## 2019-05-09 DIAGNOSIS — D631 Anemia in chronic kidney disease: Secondary | ICD-10-CM | POA: Diagnosis not present

## 2019-05-09 DIAGNOSIS — I739 Peripheral vascular disease, unspecified: Secondary | ICD-10-CM | POA: Diagnosis not present

## 2019-05-09 DIAGNOSIS — Z8673 Personal history of transient ischemic attack (TIA), and cerebral infarction without residual deficits: Secondary | ICD-10-CM | POA: Diagnosis not present

## 2019-05-09 DIAGNOSIS — Z7951 Long term (current) use of inhaled steroids: Secondary | ICD-10-CM | POA: Diagnosis not present

## 2019-05-09 DIAGNOSIS — Z7901 Long term (current) use of anticoagulants: Secondary | ICD-10-CM | POA: Diagnosis not present

## 2019-05-09 DIAGNOSIS — F0390 Unspecified dementia without behavioral disturbance: Secondary | ICD-10-CM | POA: Diagnosis not present

## 2019-05-09 NOTE — Patient Outreach (Signed)
[place] [facility]) Care Management  05/09/2019  Ikesha Siller 01-30-1941 [id number]   CSW made an attempt to try and contact patient today to perform the initial phone assessment, as well as assess and assist with social work needs and services, without success.  A HIPAA compliant message was left for patient on voicemail.  CSW is currently awaiting a return call.  CSW will encourage Eduard Clos, LCSW colleague, also with [place] Management, to make a second outreach attempt to patient within the next 4 business days, if a return call is not received from patient in the meantime.  CSW will also mail an Outreach Letter to patient's home requesting that patient contact CSW if patient is interested in receiving social work services through [place] with Scientist, clinical (histocompatibility and immunogenetics).  Nat Christen, BSW, MSW, LCSW  Licensed Education officer, environmental [facility] System  Mailing [place] [address]. [address] [place], [place] [address] Physical Address-[address], [place], [place] [address] Toll Free Main # [phone] Fax # [phone] Cell # [phone]  Office # [phone] [email or web address]

## 2019-05-09 NOTE — Patient Outreach (Signed)
Transition of care: New referral.  Discharge from [facility] on 05/07/2019.  Placed call to contact person Icis Budreau ( daughter in law who is a Marine scientist).  No answer. Left a message requesting a call back.  PLAN: will await a call back and mail outreach letter.  Tomasa Rand, RN, BSN, CEN [facility] [employer] [phone]

## 2019-05-10 DIAGNOSIS — D631 Anemia in chronic kidney disease: Secondary | ICD-10-CM | POA: Diagnosis not present

## 2019-05-10 DIAGNOSIS — E785 Hyperlipidemia, unspecified: Secondary | ICD-10-CM | POA: Diagnosis not present

## 2019-05-10 DIAGNOSIS — I739 Peripheral vascular disease, unspecified: Secondary | ICD-10-CM | POA: Diagnosis not present

## 2019-05-10 DIAGNOSIS — N189 Chronic kidney disease, unspecified: Secondary | ICD-10-CM | POA: Diagnosis not present

## 2019-05-10 DIAGNOSIS — G4730 Sleep apnea, unspecified: Secondary | ICD-10-CM | POA: Diagnosis not present

## 2019-05-10 DIAGNOSIS — I129 Hypertensive chronic kidney disease with stage 1 through stage 4 chronic kidney disease, or unspecified chronic kidney disease: Secondary | ICD-10-CM | POA: Diagnosis not present

## 2019-05-14 ENCOUNTER — Other Ambulatory Visit: Payer: Self-pay

## 2019-05-14 DIAGNOSIS — I639 Cerebral infarction, unspecified: Secondary | ICD-10-CM | POA: Diagnosis not present

## 2019-05-14 DIAGNOSIS — I679 Cerebrovascular disease, unspecified: Secondary | ICD-10-CM | POA: Diagnosis not present

## 2019-05-14 DIAGNOSIS — Z111 Encounter for screening for respiratory tuberculosis: Secondary | ICD-10-CM | POA: Diagnosis not present

## 2019-05-15 ENCOUNTER — Other Ambulatory Visit: Payer: Self-pay | Admitting: *Deleted

## 2019-05-15 DIAGNOSIS — I129 Hypertensive chronic kidney disease with stage 1 through stage 4 chronic kidney disease, or unspecified chronic kidney disease: Secondary | ICD-10-CM | POA: Diagnosis not present

## 2019-05-15 DIAGNOSIS — N189 Chronic kidney disease, unspecified: Secondary | ICD-10-CM | POA: Diagnosis not present

## 2019-05-15 DIAGNOSIS — E785 Hyperlipidemia, unspecified: Secondary | ICD-10-CM | POA: Diagnosis not present

## 2019-05-15 DIAGNOSIS — G4730 Sleep apnea, unspecified: Secondary | ICD-10-CM | POA: Diagnosis not present

## 2019-05-15 DIAGNOSIS — D631 Anemia in chronic kidney disease: Secondary | ICD-10-CM | POA: Diagnosis not present

## 2019-05-15 DIAGNOSIS — I739 Peripheral vascular disease, unspecified: Secondary | ICD-10-CM | POA: Diagnosis not present

## 2019-05-15 NOTE — Patient Outreach (Signed)
[place] [facility]) Care Management  05/15/2019  SHAELEE FORNI 23-May-1941 [id number]  CSW was advised by [facility] RNCM of pt's plans to transition ALF for long term care. CSW was advised of plans to close referral and will plan to sign off as well.   Eduard Clos, MSW, [place] Worker  [place] [phone]

## 2019-05-15 NOTE — Patient Outreach (Signed)
Transition of care:  Placed 2nd outreach call to daughter in law. ( contact person who is a Marine scientist). Spoke with Carol Ada who reports mother in law had another stroke. Reports patient no longer has any bowel or bladder control. Reports worsening upper extremity weakness.  Reports patient saw Dr. Nona Dell on 05/14/2019 and FL2 form was completed. According to daughter in law, patient to transition to assistive living in [place]. ( facility name [facility]).  Daughter in law denies any needs.  PLAN: will close case and mail outreach letter. Encouraged daughter in law to call back if she needs any assistance.  Tomasa Rand, RN, BSN, CEN [facility] [employer] [phone]

## 2019-05-16 DIAGNOSIS — I739 Peripheral vascular disease, unspecified: Secondary | ICD-10-CM | POA: Diagnosis not present

## 2019-05-16 DIAGNOSIS — I129 Hypertensive chronic kidney disease with stage 1 through stage 4 chronic kidney disease, or unspecified chronic kidney disease: Secondary | ICD-10-CM | POA: Diagnosis not present

## 2019-05-16 DIAGNOSIS — G4730 Sleep apnea, unspecified: Secondary | ICD-10-CM | POA: Diagnosis not present

## 2019-05-16 DIAGNOSIS — E785 Hyperlipidemia, unspecified: Secondary | ICD-10-CM | POA: Diagnosis not present

## 2019-05-16 DIAGNOSIS — D631 Anemia in chronic kidney disease: Secondary | ICD-10-CM | POA: Diagnosis not present

## 2019-05-16 DIAGNOSIS — N189 Chronic kidney disease, unspecified: Secondary | ICD-10-CM | POA: Diagnosis not present

## 2019-05-17 DIAGNOSIS — I739 Peripheral vascular disease, unspecified: Secondary | ICD-10-CM | POA: Diagnosis not present

## 2019-05-17 DIAGNOSIS — I129 Hypertensive chronic kidney disease with stage 1 through stage 4 chronic kidney disease, or unspecified chronic kidney disease: Secondary | ICD-10-CM | POA: Diagnosis not present

## 2019-05-17 DIAGNOSIS — I639 Cerebral infarction, unspecified: Secondary | ICD-10-CM | POA: Diagnosis not present

## 2019-05-17 DIAGNOSIS — D631 Anemia in chronic kidney disease: Secondary | ICD-10-CM | POA: Diagnosis not present

## 2019-05-17 DIAGNOSIS — E785 Hyperlipidemia, unspecified: Secondary | ICD-10-CM | POA: Diagnosis not present

## 2019-05-17 DIAGNOSIS — I679 Cerebrovascular disease, unspecified: Secondary | ICD-10-CM | POA: Diagnosis not present

## 2019-05-17 DIAGNOSIS — G4730 Sleep apnea, unspecified: Secondary | ICD-10-CM | POA: Diagnosis not present

## 2019-05-17 DIAGNOSIS — N189 Chronic kidney disease, unspecified: Secondary | ICD-10-CM | POA: Diagnosis not present

## 2019-05-21 DIAGNOSIS — D649 Anemia, unspecified: Secondary | ICD-10-CM | POA: Diagnosis not present

## 2019-05-21 DIAGNOSIS — Z8673 Personal history of transient ischemic attack (TIA), and cerebral infarction without residual deficits: Secondary | ICD-10-CM | POA: Diagnosis not present

## 2019-05-21 DIAGNOSIS — E785 Hyperlipidemia, unspecified: Secondary | ICD-10-CM | POA: Diagnosis not present

## 2019-05-21 DIAGNOSIS — Z6829 Body mass index (BMI) 29.0-29.9, adult: Secondary | ICD-10-CM | POA: Diagnosis not present

## 2019-05-21 DIAGNOSIS — M6281 Muscle weakness (generalized): Secondary | ICD-10-CM | POA: Diagnosis not present

## 2019-05-21 DIAGNOSIS — N189 Chronic kidney disease, unspecified: Secondary | ICD-10-CM | POA: Diagnosis not present

## 2019-05-21 DIAGNOSIS — R269 Unspecified abnormalities of gait and mobility: Secondary | ICD-10-CM | POA: Diagnosis not present

## 2019-05-21 DIAGNOSIS — G309 Alzheimer's disease, unspecified: Secondary | ICD-10-CM | POA: Diagnosis not present

## 2019-05-21 DIAGNOSIS — I129 Hypertensive chronic kidney disease with stage 1 through stage 4 chronic kidney disease, or unspecified chronic kidney disease: Secondary | ICD-10-CM | POA: Diagnosis not present

## 2019-05-22 DIAGNOSIS — D511 Vitamin B12 deficiency anemia due to selective vitamin B12 malabsorption with proteinuria: Secondary | ICD-10-CM | POA: Diagnosis not present

## 2019-05-22 DIAGNOSIS — E559 Vitamin D deficiency, unspecified: Secondary | ICD-10-CM | POA: Diagnosis not present

## 2019-05-22 DIAGNOSIS — E039 Hypothyroidism, unspecified: Secondary | ICD-10-CM | POA: Diagnosis not present

## 2019-05-22 DIAGNOSIS — I10 Essential (primary) hypertension: Secondary | ICD-10-CM | POA: Diagnosis not present

## 2019-05-22 DIAGNOSIS — Z7901 Long term (current) use of anticoagulants: Secondary | ICD-10-CM | POA: Diagnosis not present

## 2019-05-22 DIAGNOSIS — Z79899 Other long term (current) drug therapy: Secondary | ICD-10-CM | POA: Diagnosis not present

## 2019-05-23 DIAGNOSIS — E039 Hypothyroidism, unspecified: Secondary | ICD-10-CM | POA: Diagnosis not present

## 2019-05-23 DIAGNOSIS — D649 Anemia, unspecified: Secondary | ICD-10-CM | POA: Diagnosis not present

## 2019-05-23 DIAGNOSIS — N184 Chronic kidney disease, stage 4 (severe): Secondary | ICD-10-CM | POA: Diagnosis not present

## 2019-05-23 DIAGNOSIS — E785 Hyperlipidemia, unspecified: Secondary | ICD-10-CM | POA: Diagnosis not present

## 2019-05-23 DIAGNOSIS — R799 Abnormal finding of blood chemistry, unspecified: Secondary | ICD-10-CM | POA: Diagnosis not present

## 2019-05-23 DIAGNOSIS — G309 Alzheimer's disease, unspecified: Secondary | ICD-10-CM | POA: Diagnosis not present

## 2019-05-28 DIAGNOSIS — R269 Unspecified abnormalities of gait and mobility: Secondary | ICD-10-CM | POA: Diagnosis not present

## 2019-05-28 DIAGNOSIS — L22 Diaper dermatitis: Secondary | ICD-10-CM | POA: Diagnosis not present

## 2019-05-28 DIAGNOSIS — R32 Unspecified urinary incontinence: Secondary | ICD-10-CM | POA: Diagnosis not present

## 2019-05-28 DIAGNOSIS — G309 Alzheimer's disease, unspecified: Secondary | ICD-10-CM | POA: Diagnosis not present

## 2019-05-30 DIAGNOSIS — Z79899 Other long term (current) drug therapy: Secondary | ICD-10-CM | POA: Diagnosis not present

## 2019-05-30 DIAGNOSIS — Z7901 Long term (current) use of anticoagulants: Secondary | ICD-10-CM | POA: Diagnosis not present

## 2019-06-19 DIAGNOSIS — Z7901 Long term (current) use of anticoagulants: Secondary | ICD-10-CM | POA: Diagnosis not present

## 2019-06-21 DIAGNOSIS — Z7901 Long term (current) use of anticoagulants: Secondary | ICD-10-CM | POA: Diagnosis not present

## 2019-06-21 DIAGNOSIS — I679 Cerebrovascular disease, unspecified: Secondary | ICD-10-CM | POA: Diagnosis not present

## 2019-06-21 DIAGNOSIS — F0390 Unspecified dementia without behavioral disturbance: Secondary | ICD-10-CM | POA: Diagnosis not present

## 2019-07-10 DIAGNOSIS — R634 Abnormal weight loss: Secondary | ICD-10-CM | POA: Diagnosis not present

## 2019-07-10 DIAGNOSIS — G309 Alzheimer's disease, unspecified: Secondary | ICD-10-CM | POA: Diagnosis not present

## 2019-07-10 DIAGNOSIS — Z6827 Body mass index (BMI) 27.0-27.9, adult: Secondary | ICD-10-CM | POA: Diagnosis not present

## 2019-07-15 DIAGNOSIS — Z7901 Long term (current) use of anticoagulants: Secondary | ICD-10-CM | POA: Diagnosis not present

## 2019-07-18 DIAGNOSIS — G309 Alzheimer's disease, unspecified: Secondary | ICD-10-CM | POA: Diagnosis not present

## 2019-07-18 DIAGNOSIS — I679 Cerebrovascular disease, unspecified: Secondary | ICD-10-CM | POA: Diagnosis not present

## 2019-08-05 DIAGNOSIS — R05 Cough: Secondary | ICD-10-CM | POA: Diagnosis not present

## 2019-08-05 DIAGNOSIS — R5381 Other malaise: Secondary | ICD-10-CM | POA: Diagnosis not present

## 2019-08-05 DIAGNOSIS — G309 Alzheimer's disease, unspecified: Secondary | ICD-10-CM | POA: Diagnosis not present

## 2019-08-05 DIAGNOSIS — R0989 Other specified symptoms and signs involving the circulatory and respiratory systems: Secondary | ICD-10-CM | POA: Diagnosis not present

## 2019-08-06 DIAGNOSIS — R05 Cough: Secondary | ICD-10-CM | POA: Diagnosis not present

## 2019-08-06 DIAGNOSIS — Z6827 Body mass index (BMI) 27.0-27.9, adult: Secondary | ICD-10-CM | POA: Diagnosis not present

## 2019-08-06 DIAGNOSIS — R634 Abnormal weight loss: Secondary | ICD-10-CM | POA: Diagnosis not present

## 2019-08-06 DIAGNOSIS — G309 Alzheimer's disease, unspecified: Secondary | ICD-10-CM | POA: Diagnosis not present

## 2019-08-13 DIAGNOSIS — Z7901 Long term (current) use of anticoagulants: Secondary | ICD-10-CM | POA: Diagnosis not present

## 2019-08-14 DIAGNOSIS — G309 Alzheimer's disease, unspecified: Secondary | ICD-10-CM | POA: Diagnosis not present

## 2019-08-14 DIAGNOSIS — Z20828 Contact with and (suspected) exposure to other viral communicable diseases: Secondary | ICD-10-CM | POA: Diagnosis not present

## 2019-08-14 DIAGNOSIS — U071 COVID-19: Secondary | ICD-10-CM | POA: Diagnosis not present

## 2019-08-16 DIAGNOSIS — D649 Anemia, unspecified: Secondary | ICD-10-CM | POA: Diagnosis not present

## 2019-08-16 DIAGNOSIS — G309 Alzheimer's disease, unspecified: Secondary | ICD-10-CM | POA: Diagnosis not present

## 2019-08-16 DIAGNOSIS — I10 Essential (primary) hypertension: Secondary | ICD-10-CM | POA: Diagnosis not present

## 2019-08-16 DIAGNOSIS — U071 COVID-19: Secondary | ICD-10-CM | POA: Diagnosis not present

## 2019-08-20 DIAGNOSIS — R451 Restlessness and agitation: Secondary | ICD-10-CM | POA: Diagnosis not present

## 2019-08-20 DIAGNOSIS — U071 COVID-19: Secondary | ICD-10-CM | POA: Diagnosis not present

## 2019-08-20 DIAGNOSIS — R4182 Altered mental status, unspecified: Secondary | ICD-10-CM | POA: Diagnosis not present

## 2019-08-20 DIAGNOSIS — G309 Alzheimer's disease, unspecified: Secondary | ICD-10-CM | POA: Diagnosis not present

## 2019-08-21 DIAGNOSIS — N390 Urinary tract infection, site not specified: Secondary | ICD-10-CM | POA: Diagnosis not present

## 2019-08-22 DIAGNOSIS — E559 Vitamin D deficiency, unspecified: Secondary | ICD-10-CM | POA: Diagnosis not present

## 2019-08-22 DIAGNOSIS — I129 Hypertensive chronic kidney disease with stage 1 through stage 4 chronic kidney disease, or unspecified chronic kidney disease: Secondary | ICD-10-CM | POA: Diagnosis not present

## 2019-08-22 DIAGNOSIS — K219 Gastro-esophageal reflux disease without esophagitis: Secondary | ICD-10-CM | POA: Diagnosis not present

## 2019-08-22 DIAGNOSIS — G309 Alzheimer's disease, unspecified: Secondary | ICD-10-CM | POA: Diagnosis not present

## 2019-08-22 DIAGNOSIS — Z7901 Long term (current) use of anticoagulants: Secondary | ICD-10-CM | POA: Diagnosis not present

## 2019-08-22 DIAGNOSIS — D518 Other vitamin B12 deficiency anemias: Secondary | ICD-10-CM | POA: Diagnosis not present

## 2019-08-22 DIAGNOSIS — F419 Anxiety disorder, unspecified: Secondary | ICD-10-CM | POA: Diagnosis not present

## 2019-08-22 DIAGNOSIS — U071 COVID-19: Secondary | ICD-10-CM | POA: Diagnosis not present

## 2019-08-22 DIAGNOSIS — E039 Hypothyroidism, unspecified: Secondary | ICD-10-CM | POA: Diagnosis not present

## 2019-08-22 DIAGNOSIS — Z8673 Personal history of transient ischemic attack (TIA), and cerebral infarction without residual deficits: Secondary | ICD-10-CM | POA: Diagnosis not present

## 2019-08-22 DIAGNOSIS — F0280 Dementia in other diseases classified elsewhere without behavioral disturbance: Secondary | ICD-10-CM | POA: Diagnosis not present

## 2019-08-22 DIAGNOSIS — D649 Anemia, unspecified: Secondary | ICD-10-CM | POA: Diagnosis not present

## 2019-08-26 DIAGNOSIS — R8290 Unspecified abnormal findings in urine: Secondary | ICD-10-CM | POA: Diagnosis not present

## 2019-08-26 DIAGNOSIS — B961 Klebsiella pneumoniae [K. pneumoniae] as the cause of diseases classified elsewhere: Secondary | ICD-10-CM | POA: Diagnosis not present

## 2019-08-26 DIAGNOSIS — N390 Urinary tract infection, site not specified: Secondary | ICD-10-CM | POA: Diagnosis not present

## 2019-08-26 DIAGNOSIS — G309 Alzheimer's disease, unspecified: Secondary | ICD-10-CM | POA: Diagnosis not present

## 2019-08-27 DIAGNOSIS — I129 Hypertensive chronic kidney disease with stage 1 through stage 4 chronic kidney disease, or unspecified chronic kidney disease: Secondary | ICD-10-CM | POA: Diagnosis not present

## 2019-08-27 DIAGNOSIS — U071 COVID-19: Secondary | ICD-10-CM | POA: Diagnosis not present

## 2019-08-27 DIAGNOSIS — D649 Anemia, unspecified: Secondary | ICD-10-CM | POA: Diagnosis not present

## 2019-08-27 DIAGNOSIS — F419 Anxiety disorder, unspecified: Secondary | ICD-10-CM | POA: Diagnosis not present

## 2019-08-27 DIAGNOSIS — F0280 Dementia in other diseases classified elsewhere without behavioral disturbance: Secondary | ICD-10-CM | POA: Diagnosis not present

## 2019-08-27 DIAGNOSIS — G309 Alzheimer's disease, unspecified: Secondary | ICD-10-CM | POA: Diagnosis not present

## 2019-08-30 DIAGNOSIS — R4182 Altered mental status, unspecified: Secondary | ICD-10-CM | POA: Diagnosis not present

## 2019-08-30 DIAGNOSIS — M533 Sacrococcygeal disorders, not elsewhere classified: Secondary | ICD-10-CM | POA: Diagnosis not present

## 2019-08-30 DIAGNOSIS — G894 Chronic pain syndrome: Secondary | ICD-10-CM | POA: Diagnosis not present

## 2019-08-30 DIAGNOSIS — F419 Anxiety disorder, unspecified: Secondary | ICD-10-CM | POA: Diagnosis not present

## 2019-08-30 DIAGNOSIS — M6281 Muscle weakness (generalized): Secondary | ICD-10-CM | POA: Diagnosis not present

## 2019-08-30 DIAGNOSIS — M546 Pain in thoracic spine: Secondary | ICD-10-CM | POA: Diagnosis not present

## 2019-08-30 DIAGNOSIS — I129 Hypertensive chronic kidney disease with stage 1 through stage 4 chronic kidney disease, or unspecified chronic kidney disease: Secondary | ICD-10-CM | POA: Diagnosis not present

## 2019-08-30 DIAGNOSIS — R2689 Other abnormalities of gait and mobility: Secondary | ICD-10-CM | POA: Diagnosis not present

## 2019-08-30 DIAGNOSIS — R269 Unspecified abnormalities of gait and mobility: Secondary | ICD-10-CM | POA: Diagnosis not present

## 2019-08-30 DIAGNOSIS — M542 Cervicalgia: Secondary | ICD-10-CM | POA: Diagnosis not present

## 2019-08-30 DIAGNOSIS — M25551 Pain in right hip: Secondary | ICD-10-CM | POA: Diagnosis not present

## 2019-08-30 DIAGNOSIS — M545 Low back pain: Secondary | ICD-10-CM | POA: Diagnosis not present

## 2019-08-30 DIAGNOSIS — N390 Urinary tract infection, site not specified: Secondary | ICD-10-CM | POA: Diagnosis not present

## 2019-08-30 DIAGNOSIS — D649 Anemia, unspecified: Secondary | ICD-10-CM | POA: Diagnosis not present

## 2019-08-30 DIAGNOSIS — U071 COVID-19: Secondary | ICD-10-CM | POA: Diagnosis not present

## 2019-08-30 DIAGNOSIS — M5136 Other intervertebral disc degeneration, lumbar region: Secondary | ICD-10-CM | POA: Diagnosis not present

## 2019-08-30 DIAGNOSIS — W19XXXA Unspecified fall, initial encounter: Secondary | ICD-10-CM | POA: Diagnosis not present

## 2019-08-30 DIAGNOSIS — F0280 Dementia in other diseases classified elsewhere without behavioral disturbance: Secondary | ICD-10-CM | POA: Diagnosis not present

## 2019-08-30 DIAGNOSIS — G309 Alzheimer's disease, unspecified: Secondary | ICD-10-CM | POA: Diagnosis not present

## 2019-09-04 DIAGNOSIS — R269 Unspecified abnormalities of gait and mobility: Secondary | ICD-10-CM | POA: Diagnosis not present

## 2019-09-04 DIAGNOSIS — U071 COVID-19: Secondary | ICD-10-CM | POA: Diagnosis not present

## 2019-09-04 DIAGNOSIS — Z87440 Personal history of urinary (tract) infections: Secondary | ICD-10-CM | POA: Diagnosis not present

## 2019-09-04 DIAGNOSIS — D649 Anemia, unspecified: Secondary | ICD-10-CM | POA: Diagnosis not present

## 2019-09-04 DIAGNOSIS — F419 Anxiety disorder, unspecified: Secondary | ICD-10-CM | POA: Diagnosis not present

## 2019-09-04 DIAGNOSIS — F0280 Dementia in other diseases classified elsewhere without behavioral disturbance: Secondary | ICD-10-CM | POA: Diagnosis not present

## 2019-09-04 DIAGNOSIS — G309 Alzheimer's disease, unspecified: Secondary | ICD-10-CM | POA: Diagnosis not present

## 2019-09-04 DIAGNOSIS — M6281 Muscle weakness (generalized): Secondary | ICD-10-CM | POA: Diagnosis not present

## 2019-09-04 DIAGNOSIS — Z9114 Patient's other noncompliance with medication regimen: Secondary | ICD-10-CM | POA: Diagnosis not present

## 2019-09-04 DIAGNOSIS — I129 Hypertensive chronic kidney disease with stage 1 through stage 4 chronic kidney disease, or unspecified chronic kidney disease: Secondary | ICD-10-CM | POA: Diagnosis not present

## 2019-09-09 DIAGNOSIS — Z7901 Long term (current) use of anticoagulants: Secondary | ICD-10-CM | POA: Diagnosis not present

## 2019-09-10 DIAGNOSIS — U071 COVID-19: Secondary | ICD-10-CM | POA: Diagnosis not present

## 2019-09-10 DIAGNOSIS — F0280 Dementia in other diseases classified elsewhere without behavioral disturbance: Secondary | ICD-10-CM | POA: Diagnosis not present

## 2019-09-10 DIAGNOSIS — G309 Alzheimer's disease, unspecified: Secondary | ICD-10-CM | POA: Diagnosis not present

## 2019-09-10 DIAGNOSIS — F419 Anxiety disorder, unspecified: Secondary | ICD-10-CM | POA: Diagnosis not present

## 2019-09-10 DIAGNOSIS — I129 Hypertensive chronic kidney disease with stage 1 through stage 4 chronic kidney disease, or unspecified chronic kidney disease: Secondary | ICD-10-CM | POA: Diagnosis not present

## 2019-09-10 DIAGNOSIS — D649 Anemia, unspecified: Secondary | ICD-10-CM | POA: Diagnosis not present

## 2019-09-17 DIAGNOSIS — F330 Major depressive disorder, recurrent, mild: Secondary | ICD-10-CM | POA: Diagnosis not present

## 2019-09-17 DIAGNOSIS — G309 Alzheimer's disease, unspecified: Secondary | ICD-10-CM | POA: Diagnosis not present

## 2019-09-17 DIAGNOSIS — F419 Anxiety disorder, unspecified: Secondary | ICD-10-CM | POA: Diagnosis not present

## 2019-09-20 DIAGNOSIS — Z23 Encounter for immunization: Secondary | ICD-10-CM | POA: Diagnosis not present

## 2019-09-21 DIAGNOSIS — F0280 Dementia in other diseases classified elsewhere without behavioral disturbance: Secondary | ICD-10-CM | POA: Diagnosis not present

## 2019-09-21 DIAGNOSIS — K219 Gastro-esophageal reflux disease without esophagitis: Secondary | ICD-10-CM | POA: Diagnosis not present

## 2019-09-21 DIAGNOSIS — E039 Hypothyroidism, unspecified: Secondary | ICD-10-CM | POA: Diagnosis not present

## 2019-09-21 DIAGNOSIS — D518 Other vitamin B12 deficiency anemias: Secondary | ICD-10-CM | POA: Diagnosis not present

## 2019-09-21 DIAGNOSIS — F419 Anxiety disorder, unspecified: Secondary | ICD-10-CM | POA: Diagnosis not present

## 2019-09-21 DIAGNOSIS — I129 Hypertensive chronic kidney disease with stage 1 through stage 4 chronic kidney disease, or unspecified chronic kidney disease: Secondary | ICD-10-CM | POA: Diagnosis not present

## 2019-09-21 DIAGNOSIS — G309 Alzheimer's disease, unspecified: Secondary | ICD-10-CM | POA: Diagnosis not present

## 2019-09-21 DIAGNOSIS — Z7901 Long term (current) use of anticoagulants: Secondary | ICD-10-CM | POA: Diagnosis not present

## 2019-09-21 DIAGNOSIS — D649 Anemia, unspecified: Secondary | ICD-10-CM | POA: Diagnosis not present

## 2019-09-21 DIAGNOSIS — U071 COVID-19: Secondary | ICD-10-CM | POA: Diagnosis not present

## 2019-09-21 DIAGNOSIS — Z8673 Personal history of transient ischemic attack (TIA), and cerebral infarction without residual deficits: Secondary | ICD-10-CM | POA: Diagnosis not present

## 2019-09-21 DIAGNOSIS — E559 Vitamin D deficiency, unspecified: Secondary | ICD-10-CM | POA: Diagnosis not present

## 2019-09-24 DIAGNOSIS — G309 Alzheimer's disease, unspecified: Secondary | ICD-10-CM | POA: Diagnosis not present

## 2019-09-24 DIAGNOSIS — U071 COVID-19: Secondary | ICD-10-CM | POA: Diagnosis not present

## 2019-09-24 DIAGNOSIS — F419 Anxiety disorder, unspecified: Secondary | ICD-10-CM | POA: Diagnosis not present

## 2019-09-24 DIAGNOSIS — F0280 Dementia in other diseases classified elsewhere without behavioral disturbance: Secondary | ICD-10-CM | POA: Diagnosis not present

## 2019-09-24 DIAGNOSIS — I129 Hypertensive chronic kidney disease with stage 1 through stage 4 chronic kidney disease, or unspecified chronic kidney disease: Secondary | ICD-10-CM | POA: Diagnosis not present

## 2019-09-24 DIAGNOSIS — D649 Anemia, unspecified: Secondary | ICD-10-CM | POA: Diagnosis not present

## 2019-10-02 DIAGNOSIS — H5789 Other specified disorders of eye and adnexa: Secondary | ICD-10-CM | POA: Diagnosis not present

## 2019-10-02 DIAGNOSIS — G309 Alzheimer's disease, unspecified: Secondary | ICD-10-CM | POA: Diagnosis not present

## 2019-10-08 DIAGNOSIS — G309 Alzheimer's disease, unspecified: Secondary | ICD-10-CM | POA: Diagnosis not present

## 2019-10-08 DIAGNOSIS — F419 Anxiety disorder, unspecified: Secondary | ICD-10-CM | POA: Diagnosis not present

## 2019-10-08 DIAGNOSIS — I129 Hypertensive chronic kidney disease with stage 1 through stage 4 chronic kidney disease, or unspecified chronic kidney disease: Secondary | ICD-10-CM | POA: Diagnosis not present

## 2019-10-08 DIAGNOSIS — U071 COVID-19: Secondary | ICD-10-CM | POA: Diagnosis not present

## 2019-10-08 DIAGNOSIS — F0280 Dementia in other diseases classified elsewhere without behavioral disturbance: Secondary | ICD-10-CM | POA: Diagnosis not present

## 2019-10-08 DIAGNOSIS — D649 Anemia, unspecified: Secondary | ICD-10-CM | POA: Diagnosis not present

## 2019-10-09 DIAGNOSIS — K6289 Other specified diseases of anus and rectum: Secondary | ICD-10-CM | POA: Diagnosis not present

## 2019-10-09 DIAGNOSIS — G309 Alzheimer's disease, unspecified: Secondary | ICD-10-CM | POA: Diagnosis not present

## 2019-10-09 DIAGNOSIS — L290 Pruritus ani: Secondary | ICD-10-CM | POA: Diagnosis not present

## 2019-10-11 DIAGNOSIS — Z7901 Long term (current) use of anticoagulants: Secondary | ICD-10-CM | POA: Diagnosis not present

## 2019-10-16 DIAGNOSIS — G309 Alzheimer's disease, unspecified: Secondary | ICD-10-CM | POA: Diagnosis not present

## 2019-10-16 DIAGNOSIS — M810 Age-related osteoporosis without current pathological fracture: Secondary | ICD-10-CM | POA: Diagnosis not present

## 2019-10-16 DIAGNOSIS — I129 Hypertensive chronic kidney disease with stage 1 through stage 4 chronic kidney disease, or unspecified chronic kidney disease: Secondary | ICD-10-CM | POA: Diagnosis not present

## 2019-10-16 DIAGNOSIS — M1990 Unspecified osteoarthritis, unspecified site: Secondary | ICD-10-CM | POA: Diagnosis not present

## 2019-10-16 DIAGNOSIS — N184 Chronic kidney disease, stage 4 (severe): Secondary | ICD-10-CM | POA: Diagnosis not present

## 2019-10-16 DIAGNOSIS — G894 Chronic pain syndrome: Secondary | ICD-10-CM | POA: Diagnosis not present

## 2019-10-16 DIAGNOSIS — M5136 Other intervertebral disc degeneration, lumbar region: Secondary | ICD-10-CM | POA: Diagnosis not present

## 2019-10-17 DIAGNOSIS — U071 COVID-19: Secondary | ICD-10-CM | POA: Diagnosis not present

## 2019-10-17 DIAGNOSIS — F419 Anxiety disorder, unspecified: Secondary | ICD-10-CM | POA: Diagnosis not present

## 2019-10-17 DIAGNOSIS — D649 Anemia, unspecified: Secondary | ICD-10-CM | POA: Diagnosis not present

## 2019-10-17 DIAGNOSIS — G309 Alzheimer's disease, unspecified: Secondary | ICD-10-CM | POA: Diagnosis not present

## 2019-10-17 DIAGNOSIS — F0280 Dementia in other diseases classified elsewhere without behavioral disturbance: Secondary | ICD-10-CM | POA: Diagnosis not present

## 2019-10-17 DIAGNOSIS — I129 Hypertensive chronic kidney disease with stage 1 through stage 4 chronic kidney disease, or unspecified chronic kidney disease: Secondary | ICD-10-CM | POA: Diagnosis not present

## 2019-10-23 DIAGNOSIS — Z79899 Other long term (current) drug therapy: Secondary | ICD-10-CM | POA: Diagnosis not present

## 2019-10-23 DIAGNOSIS — I10 Essential (primary) hypertension: Secondary | ICD-10-CM | POA: Diagnosis not present

## 2019-10-24 DIAGNOSIS — G309 Alzheimer's disease, unspecified: Secondary | ICD-10-CM | POA: Diagnosis not present

## 2019-10-24 DIAGNOSIS — R799 Abnormal finding of blood chemistry, unspecified: Secondary | ICD-10-CM | POA: Diagnosis not present

## 2019-10-24 DIAGNOSIS — N184 Chronic kidney disease, stage 4 (severe): Secondary | ICD-10-CM | POA: Diagnosis not present

## 2019-10-24 DIAGNOSIS — D649 Anemia, unspecified: Secondary | ICD-10-CM | POA: Diagnosis not present

## 2019-11-08 DIAGNOSIS — Z7901 Long term (current) use of anticoagulants: Secondary | ICD-10-CM | POA: Diagnosis not present

## 2019-11-12 DIAGNOSIS — M6281 Muscle weakness (generalized): Secondary | ICD-10-CM | POA: Diagnosis not present

## 2019-11-12 DIAGNOSIS — M25511 Pain in right shoulder: Secondary | ICD-10-CM | POA: Diagnosis not present

## 2019-11-12 DIAGNOSIS — W19XXXA Unspecified fall, initial encounter: Secondary | ICD-10-CM | POA: Diagnosis not present

## 2019-11-12 DIAGNOSIS — G309 Alzheimer's disease, unspecified: Secondary | ICD-10-CM | POA: Diagnosis not present

## 2019-11-12 DIAGNOSIS — R2689 Other abnormalities of gait and mobility: Secondary | ICD-10-CM | POA: Diagnosis not present

## 2019-11-12 DIAGNOSIS — R269 Unspecified abnormalities of gait and mobility: Secondary | ICD-10-CM | POA: Diagnosis not present

## 2019-11-12 DIAGNOSIS — Z9181 History of falling: Secondary | ICD-10-CM | POA: Diagnosis not present

## 2019-11-18 DIAGNOSIS — N189 Chronic kidney disease, unspecified: Secondary | ICD-10-CM | POA: Diagnosis not present

## 2019-11-18 DIAGNOSIS — G309 Alzheimer's disease, unspecified: Secondary | ICD-10-CM | POA: Diagnosis not present

## 2019-11-18 DIAGNOSIS — M6281 Muscle weakness (generalized): Secondary | ICD-10-CM | POA: Diagnosis not present

## 2019-11-18 DIAGNOSIS — R2689 Other abnormalities of gait and mobility: Secondary | ICD-10-CM | POA: Diagnosis not present

## 2019-11-18 DIAGNOSIS — M25511 Pain in right shoulder: Secondary | ICD-10-CM | POA: Diagnosis not present

## 2019-11-18 DIAGNOSIS — D631 Anemia in chronic kidney disease: Secondary | ICD-10-CM | POA: Diagnosis not present

## 2019-11-18 DIAGNOSIS — I129 Hypertensive chronic kidney disease with stage 1 through stage 4 chronic kidney disease, or unspecified chronic kidney disease: Secondary | ICD-10-CM | POA: Diagnosis not present

## 2019-11-18 DIAGNOSIS — F419 Anxiety disorder, unspecified: Secondary | ICD-10-CM | POA: Diagnosis not present

## 2019-11-18 DIAGNOSIS — F329 Major depressive disorder, single episode, unspecified: Secondary | ICD-10-CM | POA: Diagnosis not present

## 2019-11-18 DIAGNOSIS — R54 Age-related physical debility: Secondary | ICD-10-CM | POA: Diagnosis not present

## 2019-11-18 DIAGNOSIS — F0280 Dementia in other diseases classified elsewhere without behavioral disturbance: Secondary | ICD-10-CM | POA: Diagnosis not present

## 2019-11-18 DIAGNOSIS — Z9181 History of falling: Secondary | ICD-10-CM | POA: Diagnosis not present

## 2019-11-25 DIAGNOSIS — G309 Alzheimer's disease, unspecified: Secondary | ICD-10-CM | POA: Diagnosis not present

## 2019-11-25 DIAGNOSIS — R52 Pain, unspecified: Secondary | ICD-10-CM | POA: Diagnosis not present

## 2019-11-25 DIAGNOSIS — L304 Erythema intertrigo: Secondary | ICD-10-CM | POA: Diagnosis not present

## 2019-11-26 DIAGNOSIS — Z20828 Contact with and (suspected) exposure to other viral communicable diseases: Secondary | ICD-10-CM | POA: Diagnosis not present

## 2019-11-26 DIAGNOSIS — G309 Alzheimer's disease, unspecified: Secondary | ICD-10-CM | POA: Diagnosis not present

## 2019-12-06 DIAGNOSIS — Z7901 Long term (current) use of anticoagulants: Secondary | ICD-10-CM | POA: Diagnosis not present

## 2019-12-10 DIAGNOSIS — Z20828 Contact with and (suspected) exposure to other viral communicable diseases: Secondary | ICD-10-CM | POA: Diagnosis not present

## 2019-12-10 DIAGNOSIS — G309 Alzheimer's disease, unspecified: Secondary | ICD-10-CM | POA: Diagnosis not present

## 2019-12-25 DIAGNOSIS — G309 Alzheimer's disease, unspecified: Secondary | ICD-10-CM | POA: Diagnosis not present

## 2019-12-25 DIAGNOSIS — Z20828 Contact with and (suspected) exposure to other viral communicable diseases: Secondary | ICD-10-CM | POA: Diagnosis not present

## 2019-12-31 DIAGNOSIS — G309 Alzheimer's disease, unspecified: Secondary | ICD-10-CM | POA: Diagnosis not present

## 2019-12-31 DIAGNOSIS — Z20822 Contact with and (suspected) exposure to covid-19: Secondary | ICD-10-CM | POA: Diagnosis not present

## 2019-12-31 DIAGNOSIS — Z20828 Contact with and (suspected) exposure to other viral communicable diseases: Secondary | ICD-10-CM | POA: Diagnosis not present

## 2020-01-14 DIAGNOSIS — Z20822 Contact with and (suspected) exposure to covid-19: Secondary | ICD-10-CM | POA: Diagnosis not present

## 2020-01-14 DIAGNOSIS — G309 Alzheimer's disease, unspecified: Secondary | ICD-10-CM | POA: Diagnosis not present

## 2020-01-16 DIAGNOSIS — Z23 Encounter for immunization: Secondary | ICD-10-CM | POA: Diagnosis not present

## 2020-01-21 DIAGNOSIS — Z20822 Contact with and (suspected) exposure to covid-19: Secondary | ICD-10-CM | POA: Diagnosis not present

## 2020-01-21 DIAGNOSIS — G309 Alzheimer's disease, unspecified: Secondary | ICD-10-CM | POA: Diagnosis not present

## 2020-01-30 DIAGNOSIS — E039 Hypothyroidism, unspecified: Secondary | ICD-10-CM | POA: Diagnosis not present

## 2020-01-30 DIAGNOSIS — J984 Other disorders of lung: Secondary | ICD-10-CM | POA: Diagnosis not present

## 2020-01-30 DIAGNOSIS — G309 Alzheimer's disease, unspecified: Secondary | ICD-10-CM | POA: Diagnosis not present

## 2020-01-30 DIAGNOSIS — I129 Hypertensive chronic kidney disease with stage 1 through stage 4 chronic kidney disease, or unspecified chronic kidney disease: Secondary | ICD-10-CM | POA: Diagnosis not present

## 2020-01-30 DIAGNOSIS — N184 Chronic kidney disease, stage 4 (severe): Secondary | ICD-10-CM | POA: Diagnosis not present

## 2020-01-30 DIAGNOSIS — Z7901 Long term (current) use of anticoagulants: Secondary | ICD-10-CM | POA: Diagnosis not present

## 2020-02-04 DIAGNOSIS — Z20822 Contact with and (suspected) exposure to covid-19: Secondary | ICD-10-CM | POA: Diagnosis not present

## 2020-02-04 DIAGNOSIS — G309 Alzheimer's disease, unspecified: Secondary | ICD-10-CM | POA: Diagnosis not present

## 2020-02-05 DIAGNOSIS — I10 Essential (primary) hypertension: Secondary | ICD-10-CM | POA: Diagnosis not present

## 2020-02-05 DIAGNOSIS — Z79899 Other long term (current) drug therapy: Secondary | ICD-10-CM | POA: Diagnosis not present

## 2020-02-05 DIAGNOSIS — E038 Other specified hypothyroidism: Secondary | ICD-10-CM | POA: Diagnosis not present

## 2020-02-14 DIAGNOSIS — B3789 Other sites of candidiasis: Secondary | ICD-10-CM | POA: Diagnosis not present

## 2020-02-14 DIAGNOSIS — Z23 Encounter for immunization: Secondary | ICD-10-CM | POA: Diagnosis not present

## 2020-02-14 DIAGNOSIS — G309 Alzheimer's disease, unspecified: Secondary | ICD-10-CM | POA: Diagnosis not present

## 2020-02-18 DIAGNOSIS — Z20822 Contact with and (suspected) exposure to covid-19: Secondary | ICD-10-CM | POA: Diagnosis not present

## 2020-02-18 DIAGNOSIS — G309 Alzheimer's disease, unspecified: Secondary | ICD-10-CM | POA: Diagnosis not present

## 2020-02-27 DIAGNOSIS — Z7901 Long term (current) use of anticoagulants: Secondary | ICD-10-CM | POA: Diagnosis not present

## 2020-03-03 DIAGNOSIS — G309 Alzheimer's disease, unspecified: Secondary | ICD-10-CM | POA: Diagnosis not present

## 2020-03-03 DIAGNOSIS — Z20822 Contact with and (suspected) exposure to covid-19: Secondary | ICD-10-CM | POA: Diagnosis not present

## 2020-03-26 DIAGNOSIS — Z7901 Long term (current) use of anticoagulants: Secondary | ICD-10-CM | POA: Diagnosis not present

## 2020-03-31 DIAGNOSIS — Z20822 Contact with and (suspected) exposure to covid-19: Secondary | ICD-10-CM | POA: Diagnosis not present

## 2020-03-31 DIAGNOSIS — G309 Alzheimer's disease, unspecified: Secondary | ICD-10-CM | POA: Diagnosis not present

## 2020-04-13 DIAGNOSIS — M6281 Muscle weakness (generalized): Secondary | ICD-10-CM | POA: Diagnosis not present

## 2020-04-13 DIAGNOSIS — R269 Unspecified abnormalities of gait and mobility: Secondary | ICD-10-CM | POA: Diagnosis not present

## 2020-04-13 DIAGNOSIS — G309 Alzheimer's disease, unspecified: Secondary | ICD-10-CM | POA: Diagnosis not present

## 2020-04-13 DIAGNOSIS — Z9181 History of falling: Secondary | ICD-10-CM | POA: Diagnosis not present

## 2020-04-13 DIAGNOSIS — W19XXXA Unspecified fall, initial encounter: Secondary | ICD-10-CM | POA: Diagnosis not present

## 2020-04-14 DIAGNOSIS — D509 Iron deficiency anemia, unspecified: Secondary | ICD-10-CM | POA: Diagnosis not present

## 2020-04-14 DIAGNOSIS — M810 Age-related osteoporosis without current pathological fracture: Secondary | ICD-10-CM | POA: Diagnosis not present

## 2020-04-14 DIAGNOSIS — G309 Alzheimer's disease, unspecified: Secondary | ICD-10-CM | POA: Diagnosis not present

## 2020-04-14 DIAGNOSIS — W19XXXD Unspecified fall, subsequent encounter: Secondary | ICD-10-CM | POA: Diagnosis not present

## 2020-04-14 DIAGNOSIS — Z8673 Personal history of transient ischemic attack (TIA), and cerebral infarction without residual deficits: Secondary | ICD-10-CM | POA: Diagnosis not present

## 2020-04-14 DIAGNOSIS — Z7901 Long term (current) use of anticoagulants: Secondary | ICD-10-CM | POA: Diagnosis not present

## 2020-04-14 DIAGNOSIS — Z20822 Contact with and (suspected) exposure to covid-19: Secondary | ICD-10-CM | POA: Diagnosis not present

## 2020-04-14 DIAGNOSIS — M1990 Unspecified osteoarthritis, unspecified site: Secondary | ICD-10-CM | POA: Diagnosis not present

## 2020-04-14 DIAGNOSIS — I129 Hypertensive chronic kidney disease with stage 1 through stage 4 chronic kidney disease, or unspecified chronic kidney disease: Secondary | ICD-10-CM | POA: Diagnosis not present

## 2020-04-14 DIAGNOSIS — F0280 Dementia in other diseases classified elsewhere without behavioral disturbance: Secondary | ICD-10-CM | POA: Diagnosis not present

## 2020-04-14 DIAGNOSIS — R269 Unspecified abnormalities of gait and mobility: Secondary | ICD-10-CM | POA: Diagnosis not present

## 2020-04-14 DIAGNOSIS — Z9181 History of falling: Secondary | ICD-10-CM | POA: Diagnosis not present

## 2020-04-14 DIAGNOSIS — N183 Chronic kidney disease, stage 3 unspecified: Secondary | ICD-10-CM | POA: Diagnosis not present

## 2020-04-14 DIAGNOSIS — Z8616 Personal history of COVID-19: Secondary | ICD-10-CM | POA: Diagnosis not present

## 2020-04-14 DIAGNOSIS — M8580 Other specified disorders of bone density and structure, unspecified site: Secondary | ICD-10-CM | POA: Diagnosis not present

## 2020-04-16 DIAGNOSIS — M8580 Other specified disorders of bone density and structure, unspecified site: Secondary | ICD-10-CM | POA: Diagnosis not present

## 2020-04-16 DIAGNOSIS — F0280 Dementia in other diseases classified elsewhere without behavioral disturbance: Secondary | ICD-10-CM | POA: Diagnosis not present

## 2020-04-16 DIAGNOSIS — G309 Alzheimer's disease, unspecified: Secondary | ICD-10-CM | POA: Diagnosis not present

## 2020-04-16 DIAGNOSIS — W19XXXD Unspecified fall, subsequent encounter: Secondary | ICD-10-CM | POA: Diagnosis not present

## 2020-04-16 DIAGNOSIS — R269 Unspecified abnormalities of gait and mobility: Secondary | ICD-10-CM | POA: Diagnosis not present

## 2020-04-16 DIAGNOSIS — M1990 Unspecified osteoarthritis, unspecified site: Secondary | ICD-10-CM | POA: Diagnosis not present

## 2020-04-20 DIAGNOSIS — F0280 Dementia in other diseases classified elsewhere without behavioral disturbance: Secondary | ICD-10-CM | POA: Diagnosis not present

## 2020-04-20 DIAGNOSIS — W19XXXD Unspecified fall, subsequent encounter: Secondary | ICD-10-CM | POA: Diagnosis not present

## 2020-04-20 DIAGNOSIS — G309 Alzheimer's disease, unspecified: Secondary | ICD-10-CM | POA: Diagnosis not present

## 2020-04-20 DIAGNOSIS — R269 Unspecified abnormalities of gait and mobility: Secondary | ICD-10-CM | POA: Diagnosis not present

## 2020-04-20 DIAGNOSIS — M1990 Unspecified osteoarthritis, unspecified site: Secondary | ICD-10-CM | POA: Diagnosis not present

## 2020-04-20 DIAGNOSIS — M8580 Other specified disorders of bone density and structure, unspecified site: Secondary | ICD-10-CM | POA: Diagnosis not present

## 2020-04-22 DIAGNOSIS — Z20822 Contact with and (suspected) exposure to covid-19: Secondary | ICD-10-CM | POA: Diagnosis not present

## 2020-04-22 DIAGNOSIS — G309 Alzheimer's disease, unspecified: Secondary | ICD-10-CM | POA: Diagnosis not present

## 2020-04-23 DIAGNOSIS — G309 Alzheimer's disease, unspecified: Secondary | ICD-10-CM | POA: Diagnosis not present

## 2020-04-23 DIAGNOSIS — F0280 Dementia in other diseases classified elsewhere without behavioral disturbance: Secondary | ICD-10-CM | POA: Diagnosis not present

## 2020-04-23 DIAGNOSIS — W19XXXD Unspecified fall, subsequent encounter: Secondary | ICD-10-CM | POA: Diagnosis not present

## 2020-04-23 DIAGNOSIS — M1990 Unspecified osteoarthritis, unspecified site: Secondary | ICD-10-CM | POA: Diagnosis not present

## 2020-04-23 DIAGNOSIS — M8580 Other specified disorders of bone density and structure, unspecified site: Secondary | ICD-10-CM | POA: Diagnosis not present

## 2020-04-23 DIAGNOSIS — R269 Unspecified abnormalities of gait and mobility: Secondary | ICD-10-CM | POA: Diagnosis not present

## 2020-04-24 DIAGNOSIS — Z7901 Long term (current) use of anticoagulants: Secondary | ICD-10-CM | POA: Diagnosis not present

## 2020-04-27 DIAGNOSIS — M1990 Unspecified osteoarthritis, unspecified site: Secondary | ICD-10-CM | POA: Diagnosis not present

## 2020-04-27 DIAGNOSIS — M8580 Other specified disorders of bone density and structure, unspecified site: Secondary | ICD-10-CM | POA: Diagnosis not present

## 2020-04-27 DIAGNOSIS — G309 Alzheimer's disease, unspecified: Secondary | ICD-10-CM | POA: Diagnosis not present

## 2020-04-27 DIAGNOSIS — F0280 Dementia in other diseases classified elsewhere without behavioral disturbance: Secondary | ICD-10-CM | POA: Diagnosis not present

## 2020-04-27 DIAGNOSIS — R269 Unspecified abnormalities of gait and mobility: Secondary | ICD-10-CM | POA: Diagnosis not present

## 2020-04-27 DIAGNOSIS — W19XXXD Unspecified fall, subsequent encounter: Secondary | ICD-10-CM | POA: Diagnosis not present

## 2020-04-28 DIAGNOSIS — Z20822 Contact with and (suspected) exposure to covid-19: Secondary | ICD-10-CM | POA: Diagnosis not present

## 2020-04-28 DIAGNOSIS — G309 Alzheimer's disease, unspecified: Secondary | ICD-10-CM | POA: Diagnosis not present

## 2020-04-30 DIAGNOSIS — W19XXXD Unspecified fall, subsequent encounter: Secondary | ICD-10-CM | POA: Diagnosis not present

## 2020-04-30 DIAGNOSIS — G309 Alzheimer's disease, unspecified: Secondary | ICD-10-CM | POA: Diagnosis not present

## 2020-04-30 DIAGNOSIS — M1990 Unspecified osteoarthritis, unspecified site: Secondary | ICD-10-CM | POA: Diagnosis not present

## 2020-04-30 DIAGNOSIS — R269 Unspecified abnormalities of gait and mobility: Secondary | ICD-10-CM | POA: Diagnosis not present

## 2020-04-30 DIAGNOSIS — M8580 Other specified disorders of bone density and structure, unspecified site: Secondary | ICD-10-CM | POA: Diagnosis not present

## 2020-04-30 DIAGNOSIS — F0280 Dementia in other diseases classified elsewhere without behavioral disturbance: Secondary | ICD-10-CM | POA: Diagnosis not present

## 2020-05-01 DIAGNOSIS — J984 Other disorders of lung: Secondary | ICD-10-CM | POA: Diagnosis not present

## 2020-05-01 DIAGNOSIS — N184 Chronic kidney disease, stage 4 (severe): Secondary | ICD-10-CM | POA: Diagnosis not present

## 2020-05-01 DIAGNOSIS — I129 Hypertensive chronic kidney disease with stage 1 through stage 4 chronic kidney disease, or unspecified chronic kidney disease: Secondary | ICD-10-CM | POA: Diagnosis not present

## 2020-05-01 DIAGNOSIS — D649 Anemia, unspecified: Secondary | ICD-10-CM | POA: Diagnosis not present

## 2020-05-01 DIAGNOSIS — G309 Alzheimer's disease, unspecified: Secondary | ICD-10-CM | POA: Diagnosis not present

## 2020-05-05 DIAGNOSIS — G309 Alzheimer's disease, unspecified: Secondary | ICD-10-CM | POA: Diagnosis not present

## 2020-05-05 DIAGNOSIS — Z20822 Contact with and (suspected) exposure to covid-19: Secondary | ICD-10-CM | POA: Diagnosis not present

## 2020-05-06 DIAGNOSIS — I10 Essential (primary) hypertension: Secondary | ICD-10-CM | POA: Diagnosis not present

## 2020-05-06 DIAGNOSIS — Z79899 Other long term (current) drug therapy: Secondary | ICD-10-CM | POA: Diagnosis not present

## 2020-05-06 DIAGNOSIS — D511 Vitamin B12 deficiency anemia due to selective vitamin B12 malabsorption with proteinuria: Secondary | ICD-10-CM | POA: Diagnosis not present

## 2020-05-06 DIAGNOSIS — E559 Vitamin D deficiency, unspecified: Secondary | ICD-10-CM | POA: Diagnosis not present

## 2020-05-07 DIAGNOSIS — R799 Abnormal finding of blood chemistry, unspecified: Secondary | ICD-10-CM | POA: Diagnosis not present

## 2020-05-07 DIAGNOSIS — D649 Anemia, unspecified: Secondary | ICD-10-CM | POA: Diagnosis not present

## 2020-05-07 DIAGNOSIS — G309 Alzheimer's disease, unspecified: Secondary | ICD-10-CM | POA: Diagnosis not present

## 2020-05-07 DIAGNOSIS — N184 Chronic kidney disease, stage 4 (severe): Secondary | ICD-10-CM | POA: Diagnosis not present

## 2020-05-21 DIAGNOSIS — Z7901 Long term (current) use of anticoagulants: Secondary | ICD-10-CM | POA: Diagnosis not present

## 2020-05-29 DIAGNOSIS — Z87440 Personal history of urinary (tract) infections: Secondary | ICD-10-CM | POA: Diagnosis not present

## 2020-05-29 DIAGNOSIS — G309 Alzheimer's disease, unspecified: Secondary | ICD-10-CM | POA: Diagnosis not present

## 2020-05-29 DIAGNOSIS — R4182 Altered mental status, unspecified: Secondary | ICD-10-CM | POA: Diagnosis not present

## 2020-05-29 DIAGNOSIS — N390 Urinary tract infection, site not specified: Secondary | ICD-10-CM | POA: Diagnosis not present

## 2020-07-03 DIAGNOSIS — R112 Nausea with vomiting, unspecified: Secondary | ICD-10-CM | POA: Diagnosis not present

## 2020-07-03 DIAGNOSIS — G309 Alzheimer's disease, unspecified: Secondary | ICD-10-CM | POA: Diagnosis not present

## 2020-07-07 DIAGNOSIS — G309 Alzheimer's disease, unspecified: Secondary | ICD-10-CM | POA: Diagnosis not present

## 2020-07-07 DIAGNOSIS — K649 Unspecified hemorrhoids: Secondary | ICD-10-CM | POA: Diagnosis not present

## 2020-07-16 DIAGNOSIS — Z7901 Long term (current) use of anticoagulants: Secondary | ICD-10-CM | POA: Diagnosis not present

## 2020-08-05 DIAGNOSIS — G309 Alzheimer's disease, unspecified: Secondary | ICD-10-CM | POA: Diagnosis not present

## 2020-08-05 DIAGNOSIS — U071 COVID-19: Secondary | ICD-10-CM | POA: Diagnosis not present

## 2020-08-05 DIAGNOSIS — Z20822 Contact with and (suspected) exposure to covid-19: Secondary | ICD-10-CM | POA: Diagnosis not present

## 2020-08-07 DIAGNOSIS — U071 COVID-19: Secondary | ICD-10-CM | POA: Diagnosis not present

## 2020-08-07 DIAGNOSIS — G309 Alzheimer's disease, unspecified: Secondary | ICD-10-CM | POA: Diagnosis not present

## 2020-08-07 DIAGNOSIS — Z8616 Personal history of COVID-19: Secondary | ICD-10-CM | POA: Diagnosis not present

## 2020-08-09 DIAGNOSIS — Z7901 Long term (current) use of anticoagulants: Secondary | ICD-10-CM | POA: Diagnosis not present

## 2020-08-09 DIAGNOSIS — E038 Other specified hypothyroidism: Secondary | ICD-10-CM | POA: Diagnosis not present

## 2020-08-09 DIAGNOSIS — J4530 Mild persistent asthma, uncomplicated: Secondary | ICD-10-CM | POA: Diagnosis not present

## 2020-08-09 DIAGNOSIS — I272 Pulmonary hypertension, unspecified: Secondary | ICD-10-CM | POA: Diagnosis not present

## 2020-08-09 DIAGNOSIS — F418 Other specified anxiety disorders: Secondary | ICD-10-CM | POA: Diagnosis not present

## 2020-08-09 DIAGNOSIS — U071 COVID-19: Secondary | ICD-10-CM | POA: Diagnosis not present

## 2020-08-09 DIAGNOSIS — D510 Vitamin B12 deficiency anemia due to intrinsic factor deficiency: Secondary | ICD-10-CM | POA: Diagnosis not present

## 2020-08-09 DIAGNOSIS — E559 Vitamin D deficiency, unspecified: Secondary | ICD-10-CM | POA: Diagnosis not present

## 2020-08-09 DIAGNOSIS — F0150 Vascular dementia without behavioral disturbance: Secondary | ICD-10-CM | POA: Diagnosis not present

## 2020-08-09 DIAGNOSIS — I69318 Other symptoms and signs involving cognitive functions following cerebral infarction: Secondary | ICD-10-CM | POA: Diagnosis not present

## 2020-08-12 DIAGNOSIS — I69318 Other symptoms and signs involving cognitive functions following cerebral infarction: Secondary | ICD-10-CM | POA: Diagnosis not present

## 2020-08-12 DIAGNOSIS — F0150 Vascular dementia without behavioral disturbance: Secondary | ICD-10-CM | POA: Diagnosis not present

## 2020-08-12 DIAGNOSIS — F418 Other specified anxiety disorders: Secondary | ICD-10-CM | POA: Diagnosis not present

## 2020-08-12 DIAGNOSIS — U071 COVID-19: Secondary | ICD-10-CM | POA: Diagnosis not present

## 2020-08-12 DIAGNOSIS — E038 Other specified hypothyroidism: Secondary | ICD-10-CM | POA: Diagnosis not present

## 2020-08-12 DIAGNOSIS — J4530 Mild persistent asthma, uncomplicated: Secondary | ICD-10-CM | POA: Diagnosis not present

## 2020-08-13 DIAGNOSIS — Z7901 Long term (current) use of anticoagulants: Secondary | ICD-10-CM | POA: Diagnosis not present

## 2020-08-18 DIAGNOSIS — F418 Other specified anxiety disorders: Secondary | ICD-10-CM | POA: Diagnosis not present

## 2020-08-18 DIAGNOSIS — E038 Other specified hypothyroidism: Secondary | ICD-10-CM | POA: Diagnosis not present

## 2020-08-18 DIAGNOSIS — I69318 Other symptoms and signs involving cognitive functions following cerebral infarction: Secondary | ICD-10-CM | POA: Diagnosis not present

## 2020-08-18 DIAGNOSIS — J4530 Mild persistent asthma, uncomplicated: Secondary | ICD-10-CM | POA: Diagnosis not present

## 2020-08-18 DIAGNOSIS — U071 COVID-19: Secondary | ICD-10-CM | POA: Diagnosis not present

## 2020-08-18 DIAGNOSIS — F0150 Vascular dementia without behavioral disturbance: Secondary | ICD-10-CM | POA: Diagnosis not present

## 2020-08-20 DIAGNOSIS — I69318 Other symptoms and signs involving cognitive functions following cerebral infarction: Secondary | ICD-10-CM | POA: Diagnosis not present

## 2020-08-20 DIAGNOSIS — U071 COVID-19: Secondary | ICD-10-CM | POA: Diagnosis not present

## 2020-08-20 DIAGNOSIS — J4530 Mild persistent asthma, uncomplicated: Secondary | ICD-10-CM | POA: Diagnosis not present

## 2020-08-20 DIAGNOSIS — F0150 Vascular dementia without behavioral disturbance: Secondary | ICD-10-CM | POA: Diagnosis not present

## 2020-08-20 DIAGNOSIS — F418 Other specified anxiety disorders: Secondary | ICD-10-CM | POA: Diagnosis not present

## 2020-08-20 DIAGNOSIS — E038 Other specified hypothyroidism: Secondary | ICD-10-CM | POA: Diagnosis not present

## 2020-08-24 DIAGNOSIS — F418 Other specified anxiety disorders: Secondary | ICD-10-CM | POA: Diagnosis not present

## 2020-08-24 DIAGNOSIS — F0150 Vascular dementia without behavioral disturbance: Secondary | ICD-10-CM | POA: Diagnosis not present

## 2020-08-24 DIAGNOSIS — I69318 Other symptoms and signs involving cognitive functions following cerebral infarction: Secondary | ICD-10-CM | POA: Diagnosis not present

## 2020-08-24 DIAGNOSIS — J4530 Mild persistent asthma, uncomplicated: Secondary | ICD-10-CM | POA: Diagnosis not present

## 2020-08-24 DIAGNOSIS — U071 COVID-19: Secondary | ICD-10-CM | POA: Diagnosis not present

## 2020-08-24 DIAGNOSIS — E038 Other specified hypothyroidism: Secondary | ICD-10-CM | POA: Diagnosis not present

## 2020-08-25 DIAGNOSIS — G309 Alzheimer's disease, unspecified: Secondary | ICD-10-CM | POA: Diagnosis not present

## 2020-08-25 DIAGNOSIS — I129 Hypertensive chronic kidney disease with stage 1 through stage 4 chronic kidney disease, or unspecified chronic kidney disease: Secondary | ICD-10-CM | POA: Diagnosis not present

## 2020-08-25 DIAGNOSIS — N184 Chronic kidney disease, stage 4 (severe): Secondary | ICD-10-CM | POA: Diagnosis not present

## 2020-08-25 DIAGNOSIS — D649 Anemia, unspecified: Secondary | ICD-10-CM | POA: Diagnosis not present

## 2020-08-25 DIAGNOSIS — J984 Other disorders of lung: Secondary | ICD-10-CM | POA: Diagnosis not present

## 2020-08-26 DIAGNOSIS — Z79899 Other long term (current) drug therapy: Secondary | ICD-10-CM | POA: Diagnosis not present

## 2020-08-26 DIAGNOSIS — I10 Essential (primary) hypertension: Secondary | ICD-10-CM | POA: Diagnosis not present

## 2020-08-27 DIAGNOSIS — F0150 Vascular dementia without behavioral disturbance: Secondary | ICD-10-CM | POA: Diagnosis not present

## 2020-08-27 DIAGNOSIS — J4530 Mild persistent asthma, uncomplicated: Secondary | ICD-10-CM | POA: Diagnosis not present

## 2020-08-27 DIAGNOSIS — R799 Abnormal finding of blood chemistry, unspecified: Secondary | ICD-10-CM | POA: Diagnosis not present

## 2020-08-27 DIAGNOSIS — U071 COVID-19: Secondary | ICD-10-CM | POA: Diagnosis not present

## 2020-08-27 DIAGNOSIS — G309 Alzheimer's disease, unspecified: Secondary | ICD-10-CM | POA: Diagnosis not present

## 2020-08-27 DIAGNOSIS — N184 Chronic kidney disease, stage 4 (severe): Secondary | ICD-10-CM | POA: Diagnosis not present

## 2020-08-27 DIAGNOSIS — F418 Other specified anxiety disorders: Secondary | ICD-10-CM | POA: Diagnosis not present

## 2020-08-27 DIAGNOSIS — D649 Anemia, unspecified: Secondary | ICD-10-CM | POA: Diagnosis not present

## 2020-08-27 DIAGNOSIS — E038 Other specified hypothyroidism: Secondary | ICD-10-CM | POA: Diagnosis not present

## 2020-08-27 DIAGNOSIS — I69318 Other symptoms and signs involving cognitive functions following cerebral infarction: Secondary | ICD-10-CM | POA: Diagnosis not present

## 2020-09-02 DIAGNOSIS — F418 Other specified anxiety disorders: Secondary | ICD-10-CM | POA: Diagnosis not present

## 2020-09-02 DIAGNOSIS — I69318 Other symptoms and signs involving cognitive functions following cerebral infarction: Secondary | ICD-10-CM | POA: Diagnosis not present

## 2020-09-02 DIAGNOSIS — E038 Other specified hypothyroidism: Secondary | ICD-10-CM | POA: Diagnosis not present

## 2020-09-02 DIAGNOSIS — U071 COVID-19: Secondary | ICD-10-CM | POA: Diagnosis not present

## 2020-09-02 DIAGNOSIS — F0150 Vascular dementia without behavioral disturbance: Secondary | ICD-10-CM | POA: Diagnosis not present

## 2020-09-02 DIAGNOSIS — J4530 Mild persistent asthma, uncomplicated: Secondary | ICD-10-CM | POA: Diagnosis not present

## 2020-09-08 DIAGNOSIS — F418 Other specified anxiety disorders: Secondary | ICD-10-CM | POA: Diagnosis not present

## 2020-09-08 DIAGNOSIS — U071 COVID-19: Secondary | ICD-10-CM | POA: Diagnosis not present

## 2020-09-08 DIAGNOSIS — F0150 Vascular dementia without behavioral disturbance: Secondary | ICD-10-CM | POA: Diagnosis not present

## 2020-09-08 DIAGNOSIS — D510 Vitamin B12 deficiency anemia due to intrinsic factor deficiency: Secondary | ICD-10-CM | POA: Diagnosis not present

## 2020-09-08 DIAGNOSIS — Z7901 Long term (current) use of anticoagulants: Secondary | ICD-10-CM | POA: Diagnosis not present

## 2020-09-08 DIAGNOSIS — J4530 Mild persistent asthma, uncomplicated: Secondary | ICD-10-CM | POA: Diagnosis not present

## 2020-09-08 DIAGNOSIS — I272 Pulmonary hypertension, unspecified: Secondary | ICD-10-CM | POA: Diagnosis not present

## 2020-09-08 DIAGNOSIS — I69318 Other symptoms and signs involving cognitive functions following cerebral infarction: Secondary | ICD-10-CM | POA: Diagnosis not present

## 2020-09-08 DIAGNOSIS — E038 Other specified hypothyroidism: Secondary | ICD-10-CM | POA: Diagnosis not present

## 2020-09-08 DIAGNOSIS — E559 Vitamin D deficiency, unspecified: Secondary | ICD-10-CM | POA: Diagnosis not present

## 2020-09-10 DIAGNOSIS — Z7901 Long term (current) use of anticoagulants: Secondary | ICD-10-CM | POA: Diagnosis not present

## 2020-09-15 DIAGNOSIS — Z23 Encounter for immunization: Secondary | ICD-10-CM | POA: Diagnosis not present

## 2020-09-16 DIAGNOSIS — E038 Other specified hypothyroidism: Secondary | ICD-10-CM | POA: Diagnosis not present

## 2020-09-16 DIAGNOSIS — F418 Other specified anxiety disorders: Secondary | ICD-10-CM | POA: Diagnosis not present

## 2020-09-16 DIAGNOSIS — F0150 Vascular dementia without behavioral disturbance: Secondary | ICD-10-CM | POA: Diagnosis not present

## 2020-09-16 DIAGNOSIS — U071 COVID-19: Secondary | ICD-10-CM | POA: Diagnosis not present

## 2020-09-16 DIAGNOSIS — J4530 Mild persistent asthma, uncomplicated: Secondary | ICD-10-CM | POA: Diagnosis not present

## 2020-09-16 DIAGNOSIS — I69318 Other symptoms and signs involving cognitive functions following cerebral infarction: Secondary | ICD-10-CM | POA: Diagnosis not present

## 2020-09-21 DIAGNOSIS — J4530 Mild persistent asthma, uncomplicated: Secondary | ICD-10-CM | POA: Diagnosis not present

## 2020-09-21 DIAGNOSIS — E038 Other specified hypothyroidism: Secondary | ICD-10-CM | POA: Diagnosis not present

## 2020-09-21 DIAGNOSIS — U071 COVID-19: Secondary | ICD-10-CM | POA: Diagnosis not present

## 2020-09-21 DIAGNOSIS — F0150 Vascular dementia without behavioral disturbance: Secondary | ICD-10-CM | POA: Diagnosis not present

## 2020-09-21 DIAGNOSIS — F418 Other specified anxiety disorders: Secondary | ICD-10-CM | POA: Diagnosis not present

## 2020-09-21 DIAGNOSIS — I69318 Other symptoms and signs involving cognitive functions following cerebral infarction: Secondary | ICD-10-CM | POA: Diagnosis not present

## 2020-09-28 DIAGNOSIS — L304 Erythema intertrigo: Secondary | ICD-10-CM | POA: Diagnosis not present

## 2020-09-28 DIAGNOSIS — G309 Alzheimer's disease, unspecified: Secondary | ICD-10-CM | POA: Diagnosis not present

## 2020-10-08 DIAGNOSIS — Z7901 Long term (current) use of anticoagulants: Secondary | ICD-10-CM | POA: Diagnosis not present

## 2020-11-06 DIAGNOSIS — Z7901 Long term (current) use of anticoagulants: Secondary | ICD-10-CM | POA: Diagnosis not present

## 2020-11-09 DIAGNOSIS — M6281 Muscle weakness (generalized): Secondary | ICD-10-CM | POA: Diagnosis not present

## 2020-11-09 DIAGNOSIS — R269 Unspecified abnormalities of gait and mobility: Secondary | ICD-10-CM | POA: Diagnosis not present

## 2020-11-09 DIAGNOSIS — G309 Alzheimer's disease, unspecified: Secondary | ICD-10-CM | POA: Diagnosis not present

## 2020-11-12 DIAGNOSIS — R279 Unspecified lack of coordination: Secondary | ICD-10-CM | POA: Diagnosis not present

## 2020-11-12 DIAGNOSIS — F418 Other specified anxiety disorders: Secondary | ICD-10-CM | POA: Diagnosis not present

## 2020-11-12 DIAGNOSIS — M6281 Muscle weakness (generalized): Secondary | ICD-10-CM | POA: Diagnosis not present

## 2020-11-12 DIAGNOSIS — M19049 Primary osteoarthritis, unspecified hand: Secondary | ICD-10-CM | POA: Diagnosis not present

## 2020-11-12 DIAGNOSIS — I272 Pulmonary hypertension, unspecified: Secondary | ICD-10-CM | POA: Diagnosis not present

## 2020-11-17 DIAGNOSIS — M6281 Muscle weakness (generalized): Secondary | ICD-10-CM | POA: Diagnosis not present

## 2020-11-17 DIAGNOSIS — F418 Other specified anxiety disorders: Secondary | ICD-10-CM | POA: Diagnosis not present

## 2020-11-17 DIAGNOSIS — M19049 Primary osteoarthritis, unspecified hand: Secondary | ICD-10-CM | POA: Diagnosis not present

## 2020-11-17 DIAGNOSIS — I272 Pulmonary hypertension, unspecified: Secondary | ICD-10-CM | POA: Diagnosis not present

## 2020-11-17 DIAGNOSIS — R279 Unspecified lack of coordination: Secondary | ICD-10-CM | POA: Diagnosis not present

## 2020-11-24 DIAGNOSIS — M6281 Muscle weakness (generalized): Secondary | ICD-10-CM | POA: Diagnosis not present

## 2020-11-24 DIAGNOSIS — I272 Pulmonary hypertension, unspecified: Secondary | ICD-10-CM | POA: Diagnosis not present

## 2020-11-24 DIAGNOSIS — M19049 Primary osteoarthritis, unspecified hand: Secondary | ICD-10-CM | POA: Diagnosis not present

## 2020-11-24 DIAGNOSIS — F418 Other specified anxiety disorders: Secondary | ICD-10-CM | POA: Diagnosis not present

## 2020-11-24 DIAGNOSIS — R279 Unspecified lack of coordination: Secondary | ICD-10-CM | POA: Diagnosis not present

## 2020-11-26 DIAGNOSIS — M6281 Muscle weakness (generalized): Secondary | ICD-10-CM | POA: Diagnosis not present

## 2020-11-26 DIAGNOSIS — I129 Hypertensive chronic kidney disease with stage 1 through stage 4 chronic kidney disease, or unspecified chronic kidney disease: Secondary | ICD-10-CM | POA: Diagnosis not present

## 2020-11-26 DIAGNOSIS — M1990 Unspecified osteoarthritis, unspecified site: Secondary | ICD-10-CM | POA: Diagnosis not present

## 2020-11-26 DIAGNOSIS — R269 Unspecified abnormalities of gait and mobility: Secondary | ICD-10-CM | POA: Diagnosis not present

## 2020-11-26 DIAGNOSIS — M5136 Other intervertebral disc degeneration, lumbar region: Secondary | ICD-10-CM | POA: Diagnosis not present

## 2020-11-26 DIAGNOSIS — M810 Age-related osteoporosis without current pathological fracture: Secondary | ICD-10-CM | POA: Diagnosis not present

## 2020-11-26 DIAGNOSIS — G309 Alzheimer's disease, unspecified: Secondary | ICD-10-CM | POA: Diagnosis not present

## 2020-11-26 DIAGNOSIS — N184 Chronic kidney disease, stage 4 (severe): Secondary | ICD-10-CM | POA: Diagnosis not present

## 2020-11-26 DIAGNOSIS — G894 Chronic pain syndrome: Secondary | ICD-10-CM | POA: Diagnosis not present

## 2020-11-26 DIAGNOSIS — Z6825 Body mass index (BMI) 25.0-25.9, adult: Secondary | ICD-10-CM | POA: Diagnosis not present

## 2020-11-27 DIAGNOSIS — R279 Unspecified lack of coordination: Secondary | ICD-10-CM | POA: Diagnosis not present

## 2020-11-27 DIAGNOSIS — I272 Pulmonary hypertension, unspecified: Secondary | ICD-10-CM | POA: Diagnosis not present

## 2020-11-27 DIAGNOSIS — M6281 Muscle weakness (generalized): Secondary | ICD-10-CM | POA: Diagnosis not present

## 2020-11-27 DIAGNOSIS — F418 Other specified anxiety disorders: Secondary | ICD-10-CM | POA: Diagnosis not present

## 2020-11-27 DIAGNOSIS — M19049 Primary osteoarthritis, unspecified hand: Secondary | ICD-10-CM | POA: Diagnosis not present

## 2020-12-01 DIAGNOSIS — I272 Pulmonary hypertension, unspecified: Secondary | ICD-10-CM | POA: Diagnosis not present

## 2020-12-01 DIAGNOSIS — M6281 Muscle weakness (generalized): Secondary | ICD-10-CM | POA: Diagnosis not present

## 2020-12-01 DIAGNOSIS — F418 Other specified anxiety disorders: Secondary | ICD-10-CM | POA: Diagnosis not present

## 2020-12-01 DIAGNOSIS — R279 Unspecified lack of coordination: Secondary | ICD-10-CM | POA: Diagnosis not present

## 2020-12-01 DIAGNOSIS — M19049 Primary osteoarthritis, unspecified hand: Secondary | ICD-10-CM | POA: Diagnosis not present

## 2020-12-02 DIAGNOSIS — E039 Hypothyroidism, unspecified: Secondary | ICD-10-CM | POA: Diagnosis not present

## 2020-12-02 DIAGNOSIS — Z79899 Other long term (current) drug therapy: Secondary | ICD-10-CM | POA: Diagnosis not present

## 2020-12-02 DIAGNOSIS — I10 Essential (primary) hypertension: Secondary | ICD-10-CM | POA: Diagnosis not present

## 2020-12-03 DIAGNOSIS — M6281 Muscle weakness (generalized): Secondary | ICD-10-CM | POA: Diagnosis not present

## 2020-12-03 DIAGNOSIS — R799 Abnormal finding of blood chemistry, unspecified: Secondary | ICD-10-CM | POA: Diagnosis not present

## 2020-12-03 DIAGNOSIS — D649 Anemia, unspecified: Secondary | ICD-10-CM | POA: Diagnosis not present

## 2020-12-03 DIAGNOSIS — Z7901 Long term (current) use of anticoagulants: Secondary | ICD-10-CM | POA: Diagnosis not present

## 2020-12-03 DIAGNOSIS — R279 Unspecified lack of coordination: Secondary | ICD-10-CM | POA: Diagnosis not present

## 2020-12-03 DIAGNOSIS — N184 Chronic kidney disease, stage 4 (severe): Secondary | ICD-10-CM | POA: Diagnosis not present

## 2020-12-03 DIAGNOSIS — G309 Alzheimer's disease, unspecified: Secondary | ICD-10-CM | POA: Diagnosis not present

## 2020-12-03 DIAGNOSIS — E0590 Thyrotoxicosis, unspecified without thyrotoxic crisis or storm: Secondary | ICD-10-CM | POA: Diagnosis not present

## 2020-12-03 DIAGNOSIS — I272 Pulmonary hypertension, unspecified: Secondary | ICD-10-CM | POA: Diagnosis not present

## 2020-12-03 DIAGNOSIS — M19049 Primary osteoarthritis, unspecified hand: Secondary | ICD-10-CM | POA: Diagnosis not present

## 2020-12-03 DIAGNOSIS — F418 Other specified anxiety disorders: Secondary | ICD-10-CM | POA: Diagnosis not present

## 2020-12-08 DIAGNOSIS — Z23 Encounter for immunization: Secondary | ICD-10-CM | POA: Diagnosis not present

## 2020-12-08 DIAGNOSIS — Z7185 Encounter for immunization safety counseling: Secondary | ICD-10-CM | POA: Diagnosis not present

## 2020-12-08 DIAGNOSIS — M19049 Primary osteoarthritis, unspecified hand: Secondary | ICD-10-CM | POA: Diagnosis not present

## 2020-12-08 DIAGNOSIS — I272 Pulmonary hypertension, unspecified: Secondary | ICD-10-CM | POA: Diagnosis not present

## 2020-12-08 DIAGNOSIS — G309 Alzheimer's disease, unspecified: Secondary | ICD-10-CM | POA: Diagnosis not present

## 2020-12-08 DIAGNOSIS — R279 Unspecified lack of coordination: Secondary | ICD-10-CM | POA: Diagnosis not present

## 2020-12-08 DIAGNOSIS — F418 Other specified anxiety disorders: Secondary | ICD-10-CM | POA: Diagnosis not present

## 2020-12-08 DIAGNOSIS — M6281 Muscle weakness (generalized): Secondary | ICD-10-CM | POA: Diagnosis not present

## 2020-12-10 DIAGNOSIS — M6281 Muscle weakness (generalized): Secondary | ICD-10-CM | POA: Diagnosis not present

## 2020-12-10 DIAGNOSIS — B351 Tinea unguium: Secondary | ICD-10-CM | POA: Diagnosis not present

## 2020-12-10 DIAGNOSIS — G309 Alzheimer's disease, unspecified: Secondary | ICD-10-CM | POA: Diagnosis not present

## 2020-12-10 DIAGNOSIS — S90212A Contusion of left great toe with damage to nail, initial encounter: Secondary | ICD-10-CM | POA: Diagnosis not present

## 2020-12-10 DIAGNOSIS — R269 Unspecified abnormalities of gait and mobility: Secondary | ICD-10-CM | POA: Diagnosis not present

## 2020-12-11 DIAGNOSIS — I272 Pulmonary hypertension, unspecified: Secondary | ICD-10-CM | POA: Diagnosis not present

## 2020-12-11 DIAGNOSIS — M19049 Primary osteoarthritis, unspecified hand: Secondary | ICD-10-CM | POA: Diagnosis not present

## 2020-12-11 DIAGNOSIS — M6281 Muscle weakness (generalized): Secondary | ICD-10-CM | POA: Diagnosis not present

## 2020-12-11 DIAGNOSIS — F418 Other specified anxiety disorders: Secondary | ICD-10-CM | POA: Diagnosis not present

## 2020-12-11 DIAGNOSIS — R279 Unspecified lack of coordination: Secondary | ICD-10-CM | POA: Diagnosis not present

## 2020-12-14 DIAGNOSIS — G309 Alzheimer's disease, unspecified: Secondary | ICD-10-CM | POA: Diagnosis not present

## 2020-12-14 DIAGNOSIS — K649 Unspecified hemorrhoids: Secondary | ICD-10-CM | POA: Diagnosis not present

## 2020-12-15 DIAGNOSIS — I272 Pulmonary hypertension, unspecified: Secondary | ICD-10-CM | POA: Diagnosis not present

## 2020-12-15 DIAGNOSIS — M19049 Primary osteoarthritis, unspecified hand: Secondary | ICD-10-CM | POA: Diagnosis not present

## 2020-12-15 DIAGNOSIS — R279 Unspecified lack of coordination: Secondary | ICD-10-CM | POA: Diagnosis not present

## 2020-12-15 DIAGNOSIS — F418 Other specified anxiety disorders: Secondary | ICD-10-CM | POA: Diagnosis not present

## 2020-12-15 DIAGNOSIS — M6281 Muscle weakness (generalized): Secondary | ICD-10-CM | POA: Diagnosis not present

## 2020-12-29 DIAGNOSIS — R279 Unspecified lack of coordination: Secondary | ICD-10-CM | POA: Diagnosis not present

## 2020-12-29 DIAGNOSIS — M6281 Muscle weakness (generalized): Secondary | ICD-10-CM | POA: Diagnosis not present

## 2020-12-29 DIAGNOSIS — M19049 Primary osteoarthritis, unspecified hand: Secondary | ICD-10-CM | POA: Diagnosis not present

## 2020-12-29 DIAGNOSIS — F418 Other specified anxiety disorders: Secondary | ICD-10-CM | POA: Diagnosis not present

## 2020-12-29 DIAGNOSIS — I272 Pulmonary hypertension, unspecified: Secondary | ICD-10-CM | POA: Diagnosis not present

## 2020-12-30 DIAGNOSIS — F418 Other specified anxiety disorders: Secondary | ICD-10-CM | POA: Diagnosis not present

## 2020-12-30 DIAGNOSIS — R279 Unspecified lack of coordination: Secondary | ICD-10-CM | POA: Diagnosis not present

## 2020-12-30 DIAGNOSIS — M6281 Muscle weakness (generalized): Secondary | ICD-10-CM | POA: Diagnosis not present

## 2020-12-30 DIAGNOSIS — M19049 Primary osteoarthritis, unspecified hand: Secondary | ICD-10-CM | POA: Diagnosis not present

## 2020-12-30 DIAGNOSIS — I272 Pulmonary hypertension, unspecified: Secondary | ICD-10-CM | POA: Diagnosis not present

## 2021-01-05 DIAGNOSIS — G309 Alzheimer's disease, unspecified: Secondary | ICD-10-CM | POA: Diagnosis not present

## 2021-01-05 DIAGNOSIS — M19049 Primary osteoarthritis, unspecified hand: Secondary | ICD-10-CM | POA: Diagnosis not present

## 2021-01-05 DIAGNOSIS — F418 Other specified anxiety disorders: Secondary | ICD-10-CM | POA: Diagnosis not present

## 2021-01-05 DIAGNOSIS — Z20822 Contact with and (suspected) exposure to covid-19: Secondary | ICD-10-CM | POA: Diagnosis not present

## 2021-01-05 DIAGNOSIS — M6281 Muscle weakness (generalized): Secondary | ICD-10-CM | POA: Diagnosis not present

## 2021-01-05 DIAGNOSIS — I272 Pulmonary hypertension, unspecified: Secondary | ICD-10-CM | POA: Diagnosis not present

## 2021-01-05 DIAGNOSIS — R279 Unspecified lack of coordination: Secondary | ICD-10-CM | POA: Diagnosis not present

## 2021-01-06 DIAGNOSIS — R279 Unspecified lack of coordination: Secondary | ICD-10-CM | POA: Diagnosis not present

## 2021-01-06 DIAGNOSIS — I272 Pulmonary hypertension, unspecified: Secondary | ICD-10-CM | POA: Diagnosis not present

## 2021-01-06 DIAGNOSIS — M19049 Primary osteoarthritis, unspecified hand: Secondary | ICD-10-CM | POA: Diagnosis not present

## 2021-01-06 DIAGNOSIS — F418 Other specified anxiety disorders: Secondary | ICD-10-CM | POA: Diagnosis not present

## 2021-01-06 DIAGNOSIS — M6281 Muscle weakness (generalized): Secondary | ICD-10-CM | POA: Diagnosis not present

## 2021-01-12 DIAGNOSIS — Z20822 Contact with and (suspected) exposure to covid-19: Secondary | ICD-10-CM | POA: Diagnosis not present

## 2021-01-12 DIAGNOSIS — R54 Age-related physical debility: Secondary | ICD-10-CM | POA: Diagnosis not present

## 2021-01-19 DIAGNOSIS — Z20822 Contact with and (suspected) exposure to covid-19: Secondary | ICD-10-CM | POA: Diagnosis not present

## 2021-01-19 DIAGNOSIS — G309 Alzheimer's disease, unspecified: Secondary | ICD-10-CM | POA: Diagnosis not present

## 2021-01-26 DIAGNOSIS — Z20822 Contact with and (suspected) exposure to covid-19: Secondary | ICD-10-CM | POA: Diagnosis not present

## 2021-01-26 DIAGNOSIS — G309 Alzheimer's disease, unspecified: Secondary | ICD-10-CM | POA: Diagnosis not present

## 2021-01-28 DIAGNOSIS — Z7901 Long term (current) use of anticoagulants: Secondary | ICD-10-CM | POA: Diagnosis not present

## 2021-02-02 DIAGNOSIS — G309 Alzheimer's disease, unspecified: Secondary | ICD-10-CM | POA: Diagnosis not present

## 2021-02-02 DIAGNOSIS — Z20822 Contact with and (suspected) exposure to covid-19: Secondary | ICD-10-CM | POA: Diagnosis not present

## 2021-02-04 DIAGNOSIS — L304 Erythema intertrigo: Secondary | ICD-10-CM | POA: Diagnosis not present

## 2021-02-04 DIAGNOSIS — G309 Alzheimer's disease, unspecified: Secondary | ICD-10-CM | POA: Diagnosis not present

## 2021-02-04 DIAGNOSIS — R269 Unspecified abnormalities of gait and mobility: Secondary | ICD-10-CM | POA: Diagnosis not present

## 2021-02-04 DIAGNOSIS — M6281 Muscle weakness (generalized): Secondary | ICD-10-CM | POA: Diagnosis not present

## 2021-02-09 DIAGNOSIS — G309 Alzheimer's disease, unspecified: Secondary | ICD-10-CM | POA: Diagnosis not present

## 2021-02-09 DIAGNOSIS — Z20822 Contact with and (suspected) exposure to covid-19: Secondary | ICD-10-CM | POA: Diagnosis not present

## 2021-02-16 DIAGNOSIS — Z20822 Contact with and (suspected) exposure to covid-19: Secondary | ICD-10-CM | POA: Diagnosis not present

## 2021-02-16 DIAGNOSIS — R54 Age-related physical debility: Secondary | ICD-10-CM | POA: Diagnosis not present

## 2021-02-23 DIAGNOSIS — G309 Alzheimer's disease, unspecified: Secondary | ICD-10-CM | POA: Diagnosis not present

## 2021-02-23 DIAGNOSIS — Z20822 Contact with and (suspected) exposure to covid-19: Secondary | ICD-10-CM | POA: Diagnosis not present

## 2021-02-25 DIAGNOSIS — Z7901 Long term (current) use of anticoagulants: Secondary | ICD-10-CM | POA: Diagnosis not present

## 2021-03-01 DIAGNOSIS — G309 Alzheimer's disease, unspecified: Secondary | ICD-10-CM | POA: Diagnosis not present

## 2021-03-01 DIAGNOSIS — N184 Chronic kidney disease, stage 4 (severe): Secondary | ICD-10-CM | POA: Diagnosis not present

## 2021-03-01 DIAGNOSIS — K2100 Gastro-esophageal reflux disease with esophagitis, without bleeding: Secondary | ICD-10-CM | POA: Diagnosis not present

## 2021-03-01 DIAGNOSIS — J984 Other disorders of lung: Secondary | ICD-10-CM | POA: Diagnosis not present

## 2021-03-01 DIAGNOSIS — I129 Hypertensive chronic kidney disease with stage 1 through stage 4 chronic kidney disease, or unspecified chronic kidney disease: Secondary | ICD-10-CM | POA: Diagnosis not present

## 2021-03-03 DIAGNOSIS — E039 Hypothyroidism, unspecified: Secondary | ICD-10-CM | POA: Diagnosis not present

## 2021-03-03 DIAGNOSIS — Z79899 Other long term (current) drug therapy: Secondary | ICD-10-CM | POA: Diagnosis not present

## 2021-03-03 DIAGNOSIS — I10 Essential (primary) hypertension: Secondary | ICD-10-CM | POA: Diagnosis not present

## 2021-03-04 DIAGNOSIS — N189 Chronic kidney disease, unspecified: Secondary | ICD-10-CM | POA: Diagnosis not present

## 2021-03-04 DIAGNOSIS — I7091 Generalized atherosclerosis: Secondary | ICD-10-CM | POA: Diagnosis not present

## 2021-03-04 DIAGNOSIS — B351 Tinea unguium: Secondary | ICD-10-CM | POA: Diagnosis not present

## 2021-03-25 DIAGNOSIS — Z7901 Long term (current) use of anticoagulants: Secondary | ICD-10-CM | POA: Diagnosis not present

## 2021-04-01 DIAGNOSIS — D518 Other vitamin B12 deficiency anemias: Secondary | ICD-10-CM | POA: Diagnosis not present

## 2021-04-01 DIAGNOSIS — E119 Type 2 diabetes mellitus without complications: Secondary | ICD-10-CM | POA: Diagnosis not present

## 2021-04-01 DIAGNOSIS — Z111 Encounter for screening for respiratory tuberculosis: Secondary | ICD-10-CM | POA: Diagnosis not present

## 2021-04-01 DIAGNOSIS — E559 Vitamin D deficiency, unspecified: Secondary | ICD-10-CM | POA: Diagnosis not present

## 2021-04-01 DIAGNOSIS — Z7901 Long term (current) use of anticoagulants: Secondary | ICD-10-CM | POA: Diagnosis not present

## 2021-04-01 DIAGNOSIS — E7849 Other hyperlipidemia: Secondary | ICD-10-CM | POA: Diagnosis not present

## 2021-04-01 DIAGNOSIS — Z79899 Other long term (current) drug therapy: Secondary | ICD-10-CM | POA: Diagnosis not present

## 2021-04-02 DIAGNOSIS — D649 Anemia, unspecified: Secondary | ICD-10-CM | POA: Diagnosis not present

## 2021-04-02 DIAGNOSIS — I10 Essential (primary) hypertension: Secondary | ICD-10-CM | POA: Diagnosis not present

## 2021-04-02 DIAGNOSIS — E039 Hypothyroidism, unspecified: Secondary | ICD-10-CM | POA: Diagnosis not present

## 2021-04-02 DIAGNOSIS — B351 Tinea unguium: Secondary | ICD-10-CM | POA: Diagnosis not present

## 2021-04-02 DIAGNOSIS — E785 Hyperlipidemia, unspecified: Secondary | ICD-10-CM | POA: Diagnosis not present

## 2021-04-02 DIAGNOSIS — F32A Depression, unspecified: Secondary | ICD-10-CM | POA: Diagnosis not present

## 2021-04-02 DIAGNOSIS — J984 Other disorders of lung: Secondary | ICD-10-CM | POA: Diagnosis not present

## 2021-04-02 DIAGNOSIS — E559 Vitamin D deficiency, unspecified: Secondary | ICD-10-CM | POA: Diagnosis not present

## 2021-04-02 DIAGNOSIS — K219 Gastro-esophageal reflux disease without esophagitis: Secondary | ICD-10-CM | POA: Diagnosis not present

## 2021-04-02 DIAGNOSIS — Z20828 Contact with and (suspected) exposure to other viral communicable diseases: Secondary | ICD-10-CM | POA: Diagnosis not present

## 2021-04-02 DIAGNOSIS — U071 COVID-19: Secondary | ICD-10-CM | POA: Diagnosis not present

## 2021-04-02 DIAGNOSIS — N189 Chronic kidney disease, unspecified: Secondary | ICD-10-CM | POA: Diagnosis not present

## 2021-04-08 DIAGNOSIS — Z7901 Long term (current) use of anticoagulants: Secondary | ICD-10-CM | POA: Diagnosis not present

## 2021-04-15 DIAGNOSIS — Z7901 Long term (current) use of anticoagulants: Secondary | ICD-10-CM | POA: Diagnosis not present

## 2021-04-15 DIAGNOSIS — Z111 Encounter for screening for respiratory tuberculosis: Secondary | ICD-10-CM | POA: Diagnosis not present

## 2021-04-22 DIAGNOSIS — Z7901 Long term (current) use of anticoagulants: Secondary | ICD-10-CM | POA: Diagnosis not present

## 2021-04-28 DIAGNOSIS — Z7901 Long term (current) use of anticoagulants: Secondary | ICD-10-CM | POA: Diagnosis not present

## 2021-04-30 DIAGNOSIS — Z7901 Long term (current) use of anticoagulants: Secondary | ICD-10-CM | POA: Diagnosis not present

## 2021-04-30 DIAGNOSIS — N189 Chronic kidney disease, unspecified: Secondary | ICD-10-CM | POA: Diagnosis not present

## 2021-04-30 DIAGNOSIS — M1990 Unspecified osteoarthritis, unspecified site: Secondary | ICD-10-CM | POA: Diagnosis not present

## 2021-04-30 DIAGNOSIS — E559 Vitamin D deficiency, unspecified: Secondary | ICD-10-CM | POA: Diagnosis not present

## 2021-04-30 DIAGNOSIS — D649 Anemia, unspecified: Secondary | ICD-10-CM | POA: Diagnosis not present

## 2021-04-30 DIAGNOSIS — E785 Hyperlipidemia, unspecified: Secondary | ICD-10-CM | POA: Diagnosis not present

## 2021-04-30 DIAGNOSIS — B351 Tinea unguium: Secondary | ICD-10-CM | POA: Diagnosis not present

## 2021-04-30 DIAGNOSIS — J302 Other seasonal allergic rhinitis: Secondary | ICD-10-CM | POA: Diagnosis not present

## 2021-04-30 DIAGNOSIS — E860 Dehydration: Secondary | ICD-10-CM | POA: Diagnosis not present

## 2021-04-30 DIAGNOSIS — K219 Gastro-esophageal reflux disease without esophagitis: Secondary | ICD-10-CM | POA: Diagnosis not present

## 2021-05-06 DIAGNOSIS — E038 Other specified hypothyroidism: Secondary | ICD-10-CM | POA: Diagnosis not present

## 2021-05-06 DIAGNOSIS — E7849 Other hyperlipidemia: Secondary | ICD-10-CM | POA: Diagnosis not present

## 2021-05-06 DIAGNOSIS — Z79899 Other long term (current) drug therapy: Secondary | ICD-10-CM | POA: Diagnosis not present

## 2021-05-06 DIAGNOSIS — Z7901 Long term (current) use of anticoagulants: Secondary | ICD-10-CM | POA: Diagnosis not present

## 2021-05-13 DIAGNOSIS — Z7901 Long term (current) use of anticoagulants: Secondary | ICD-10-CM | POA: Diagnosis not present

## 2021-05-20 DIAGNOSIS — U071 COVID-19: Secondary | ICD-10-CM | POA: Diagnosis not present

## 2021-05-20 DIAGNOSIS — Z7901 Long term (current) use of anticoagulants: Secondary | ICD-10-CM | POA: Diagnosis not present

## 2021-05-20 DIAGNOSIS — Z20828 Contact with and (suspected) exposure to other viral communicable diseases: Secondary | ICD-10-CM | POA: Diagnosis not present

## 2021-05-25 DIAGNOSIS — E038 Other specified hypothyroidism: Secondary | ICD-10-CM | POA: Diagnosis not present

## 2021-05-25 DIAGNOSIS — D518 Other vitamin B12 deficiency anemias: Secondary | ICD-10-CM | POA: Diagnosis not present

## 2021-05-25 DIAGNOSIS — E039 Hypothyroidism, unspecified: Secondary | ICD-10-CM | POA: Diagnosis not present

## 2021-05-25 DIAGNOSIS — E785 Hyperlipidemia, unspecified: Secondary | ICD-10-CM | POA: Diagnosis not present

## 2021-05-25 DIAGNOSIS — E119 Type 2 diabetes mellitus without complications: Secondary | ICD-10-CM | POA: Diagnosis not present

## 2021-05-25 DIAGNOSIS — E559 Vitamin D deficiency, unspecified: Secondary | ICD-10-CM | POA: Diagnosis not present

## 2021-05-25 DIAGNOSIS — N189 Chronic kidney disease, unspecified: Secondary | ICD-10-CM | POA: Diagnosis not present

## 2021-05-25 DIAGNOSIS — I10 Essential (primary) hypertension: Secondary | ICD-10-CM | POA: Diagnosis not present

## 2021-05-25 DIAGNOSIS — M1990 Unspecified osteoarthritis, unspecified site: Secondary | ICD-10-CM | POA: Diagnosis not present

## 2021-05-27 DIAGNOSIS — Z7901 Long term (current) use of anticoagulants: Secondary | ICD-10-CM | POA: Diagnosis not present

## 2021-05-28 DIAGNOSIS — E559 Vitamin D deficiency, unspecified: Secondary | ICD-10-CM | POA: Diagnosis not present

## 2021-05-28 DIAGNOSIS — E0590 Thyrotoxicosis, unspecified without thyrotoxic crisis or storm: Secondary | ICD-10-CM | POA: Diagnosis not present

## 2021-05-28 DIAGNOSIS — E785 Hyperlipidemia, unspecified: Secondary | ICD-10-CM | POA: Diagnosis not present

## 2021-05-28 DIAGNOSIS — K219 Gastro-esophageal reflux disease without esophagitis: Secondary | ICD-10-CM | POA: Diagnosis not present

## 2021-05-28 DIAGNOSIS — J302 Other seasonal allergic rhinitis: Secondary | ICD-10-CM | POA: Diagnosis not present

## 2021-05-28 DIAGNOSIS — F419 Anxiety disorder, unspecified: Secondary | ICD-10-CM | POA: Diagnosis not present

## 2021-05-28 DIAGNOSIS — M1990 Unspecified osteoarthritis, unspecified site: Secondary | ICD-10-CM | POA: Diagnosis not present

## 2021-05-28 DIAGNOSIS — I10 Essential (primary) hypertension: Secondary | ICD-10-CM | POA: Diagnosis not present

## 2021-05-31 DIAGNOSIS — Z7901 Long term (current) use of anticoagulants: Secondary | ICD-10-CM | POA: Diagnosis not present

## 2021-06-01 DIAGNOSIS — I77811 Abdominal aortic ectasia: Secondary | ICD-10-CM | POA: Diagnosis not present

## 2021-06-03 DIAGNOSIS — E7849 Other hyperlipidemia: Secondary | ICD-10-CM | POA: Diagnosis not present

## 2021-06-03 DIAGNOSIS — Z79899 Other long term (current) drug therapy: Secondary | ICD-10-CM | POA: Diagnosis not present

## 2021-06-03 DIAGNOSIS — D518 Other vitamin B12 deficiency anemias: Secondary | ICD-10-CM | POA: Diagnosis not present

## 2021-06-03 DIAGNOSIS — E042 Nontoxic multinodular goiter: Secondary | ICD-10-CM | POA: Diagnosis not present

## 2021-06-03 DIAGNOSIS — R221 Localized swelling, mass and lump, neck: Secondary | ICD-10-CM | POA: Diagnosis not present

## 2021-06-03 DIAGNOSIS — E119 Type 2 diabetes mellitus without complications: Secondary | ICD-10-CM | POA: Diagnosis not present

## 2021-06-03 DIAGNOSIS — E039 Hypothyroidism, unspecified: Secondary | ICD-10-CM | POA: Diagnosis not present

## 2021-06-04 DIAGNOSIS — R229 Localized swelling, mass and lump, unspecified: Secondary | ICD-10-CM | POA: Diagnosis not present

## 2021-06-04 DIAGNOSIS — D492 Neoplasm of unspecified behavior of bone, soft tissue, and skin: Secondary | ICD-10-CM | POA: Diagnosis not present

## 2021-06-07 DIAGNOSIS — R0989 Other specified symptoms and signs involving the circulatory and respiratory systems: Secondary | ICD-10-CM | POA: Diagnosis not present

## 2021-06-07 DIAGNOSIS — I6523 Occlusion and stenosis of bilateral carotid arteries: Secondary | ICD-10-CM | POA: Diagnosis not present

## 2021-06-08 DIAGNOSIS — E119 Type 2 diabetes mellitus without complications: Secondary | ICD-10-CM | POA: Diagnosis not present

## 2021-06-08 DIAGNOSIS — E038 Other specified hypothyroidism: Secondary | ICD-10-CM | POA: Diagnosis not present

## 2021-06-08 DIAGNOSIS — E785 Hyperlipidemia, unspecified: Secondary | ICD-10-CM | POA: Diagnosis not present

## 2021-06-08 DIAGNOSIS — N189 Chronic kidney disease, unspecified: Secondary | ICD-10-CM | POA: Diagnosis not present

## 2021-06-08 DIAGNOSIS — D518 Other vitamin B12 deficiency anemias: Secondary | ICD-10-CM | POA: Diagnosis not present

## 2021-06-08 DIAGNOSIS — N183 Chronic kidney disease, stage 3 unspecified: Secondary | ICD-10-CM | POA: Diagnosis not present

## 2021-06-08 DIAGNOSIS — M1990 Unspecified osteoarthritis, unspecified site: Secondary | ICD-10-CM | POA: Diagnosis not present

## 2021-06-08 DIAGNOSIS — I10 Essential (primary) hypertension: Secondary | ICD-10-CM | POA: Diagnosis not present

## 2021-06-08 DIAGNOSIS — F0280 Dementia in other diseases classified elsewhere without behavioral disturbance: Secondary | ICD-10-CM | POA: Diagnosis not present

## 2021-06-08 DIAGNOSIS — R011 Cardiac murmur, unspecified: Secondary | ICD-10-CM | POA: Diagnosis not present

## 2021-06-08 DIAGNOSIS — E559 Vitamin D deficiency, unspecified: Secondary | ICD-10-CM | POA: Diagnosis not present

## 2021-06-08 DIAGNOSIS — E039 Hypothyroidism, unspecified: Secondary | ICD-10-CM | POA: Diagnosis not present

## 2021-06-09 DIAGNOSIS — R590 Localized enlarged lymph nodes: Secondary | ICD-10-CM | POA: Diagnosis not present

## 2021-06-10 DIAGNOSIS — Z7901 Long term (current) use of anticoagulants: Secondary | ICD-10-CM | POA: Diagnosis not present

## 2021-06-17 DIAGNOSIS — Z79899 Other long term (current) drug therapy: Secondary | ICD-10-CM | POA: Diagnosis not present

## 2021-06-17 DIAGNOSIS — D518 Other vitamin B12 deficiency anemias: Secondary | ICD-10-CM | POA: Diagnosis not present

## 2021-06-17 DIAGNOSIS — E7849 Other hyperlipidemia: Secondary | ICD-10-CM | POA: Diagnosis not present

## 2021-06-17 DIAGNOSIS — Z7901 Long term (current) use of anticoagulants: Secondary | ICD-10-CM | POA: Diagnosis not present

## 2021-06-17 DIAGNOSIS — E119 Type 2 diabetes mellitus without complications: Secondary | ICD-10-CM | POA: Diagnosis not present

## 2021-06-23 DIAGNOSIS — R300 Dysuria: Secondary | ICD-10-CM | POA: Diagnosis not present

## 2021-06-23 DIAGNOSIS — N189 Chronic kidney disease, unspecified: Secondary | ICD-10-CM | POA: Diagnosis not present

## 2021-06-23 DIAGNOSIS — F32A Depression, unspecified: Secondary | ICD-10-CM | POA: Diagnosis not present

## 2021-06-23 DIAGNOSIS — R2681 Unsteadiness on feet: Secondary | ICD-10-CM | POA: Diagnosis not present

## 2021-06-23 DIAGNOSIS — K219 Gastro-esophageal reflux disease without esophagitis: Secondary | ICD-10-CM | POA: Diagnosis not present

## 2021-06-23 DIAGNOSIS — Z741 Need for assistance with personal care: Secondary | ICD-10-CM | POA: Diagnosis not present

## 2021-06-23 DIAGNOSIS — D518 Other vitamin B12 deficiency anemias: Secondary | ICD-10-CM | POA: Diagnosis not present

## 2021-06-23 DIAGNOSIS — M1990 Unspecified osteoarthritis, unspecified site: Secondary | ICD-10-CM | POA: Diagnosis not present

## 2021-06-23 DIAGNOSIS — F419 Anxiety disorder, unspecified: Secondary | ICD-10-CM | POA: Diagnosis not present

## 2021-06-23 DIAGNOSIS — D519 Vitamin B12 deficiency anemia, unspecified: Secondary | ICD-10-CM | POA: Diagnosis not present

## 2021-06-23 DIAGNOSIS — Z79899 Other long term (current) drug therapy: Secondary | ICD-10-CM | POA: Diagnosis not present

## 2021-06-23 DIAGNOSIS — Z7901 Long term (current) use of anticoagulants: Secondary | ICD-10-CM | POA: Diagnosis not present

## 2021-06-23 DIAGNOSIS — G309 Alzheimer's disease, unspecified: Secondary | ICD-10-CM | POA: Diagnosis not present

## 2021-06-23 DIAGNOSIS — I10 Essential (primary) hypertension: Secondary | ICD-10-CM | POA: Diagnosis not present

## 2021-06-23 DIAGNOSIS — Z9181 History of falling: Secondary | ICD-10-CM | POA: Diagnosis not present

## 2021-06-23 DIAGNOSIS — M6281 Muscle weakness (generalized): Secondary | ICD-10-CM | POA: Diagnosis not present

## 2021-06-23 DIAGNOSIS — E7800 Pure hypercholesterolemia, unspecified: Secondary | ICD-10-CM | POA: Diagnosis not present

## 2021-06-23 DIAGNOSIS — Z7902 Long term (current) use of antithrombotics/antiplatelets: Secondary | ICD-10-CM | POA: Diagnosis not present

## 2021-06-23 DIAGNOSIS — E119 Type 2 diabetes mellitus without complications: Secondary | ICD-10-CM | POA: Diagnosis not present

## 2021-06-23 DIAGNOSIS — E039 Hypothyroidism, unspecified: Secondary | ICD-10-CM | POA: Diagnosis not present

## 2021-06-24 DIAGNOSIS — I10 Essential (primary) hypertension: Secondary | ICD-10-CM | POA: Diagnosis not present

## 2021-06-25 DIAGNOSIS — M1990 Unspecified osteoarthritis, unspecified site: Secondary | ICD-10-CM | POA: Diagnosis not present

## 2021-06-25 DIAGNOSIS — F0280 Dementia in other diseases classified elsewhere without behavioral disturbance: Secondary | ICD-10-CM | POA: Diagnosis not present

## 2021-06-25 DIAGNOSIS — E785 Hyperlipidemia, unspecified: Secondary | ICD-10-CM | POA: Diagnosis not present

## 2021-06-25 DIAGNOSIS — N183 Chronic kidney disease, stage 3 unspecified: Secondary | ICD-10-CM | POA: Diagnosis not present

## 2021-06-25 DIAGNOSIS — J302 Other seasonal allergic rhinitis: Secondary | ICD-10-CM | POA: Diagnosis not present

## 2021-06-25 DIAGNOSIS — L84 Corns and callosities: Secondary | ICD-10-CM | POA: Diagnosis not present

## 2021-06-25 DIAGNOSIS — R600 Localized edema: Secondary | ICD-10-CM | POA: Diagnosis not present

## 2021-06-25 DIAGNOSIS — M6281 Muscle weakness (generalized): Secondary | ICD-10-CM | POA: Diagnosis not present

## 2021-06-25 DIAGNOSIS — Z9181 History of falling: Secondary | ICD-10-CM | POA: Diagnosis not present

## 2021-06-25 DIAGNOSIS — G309 Alzheimer's disease, unspecified: Secondary | ICD-10-CM | POA: Diagnosis not present

## 2021-06-25 DIAGNOSIS — I10 Essential (primary) hypertension: Secondary | ICD-10-CM | POA: Diagnosis not present

## 2021-06-25 DIAGNOSIS — N189 Chronic kidney disease, unspecified: Secondary | ICD-10-CM | POA: Diagnosis not present

## 2021-06-25 DIAGNOSIS — R2681 Unsteadiness on feet: Secondary | ICD-10-CM | POA: Diagnosis not present

## 2021-06-25 DIAGNOSIS — R262 Difficulty in walking, not elsewhere classified: Secondary | ICD-10-CM | POA: Diagnosis not present

## 2021-06-25 DIAGNOSIS — R531 Weakness: Secondary | ICD-10-CM | POA: Diagnosis not present

## 2021-06-25 DIAGNOSIS — H43399 Other vitreous opacities, unspecified eye: Secondary | ICD-10-CM | POA: Diagnosis not present

## 2021-06-25 DIAGNOSIS — E0590 Thyrotoxicosis, unspecified without thyrotoxic crisis or storm: Secondary | ICD-10-CM | POA: Diagnosis not present

## 2021-06-25 DIAGNOSIS — H547 Unspecified visual loss: Secondary | ICD-10-CM | POA: Diagnosis not present

## 2021-06-29 DIAGNOSIS — N183 Chronic kidney disease, stage 3 unspecified: Secondary | ICD-10-CM | POA: Diagnosis not present

## 2021-07-01 DIAGNOSIS — M79604 Pain in right leg: Secondary | ICD-10-CM | POA: Diagnosis not present

## 2021-07-01 DIAGNOSIS — R600 Localized edema: Secondary | ICD-10-CM | POA: Diagnosis not present

## 2021-07-02 DIAGNOSIS — R2681 Unsteadiness on feet: Secondary | ICD-10-CM | POA: Diagnosis not present

## 2021-07-02 DIAGNOSIS — Z9181 History of falling: Secondary | ICD-10-CM | POA: Diagnosis not present

## 2021-07-02 DIAGNOSIS — M1990 Unspecified osteoarthritis, unspecified site: Secondary | ICD-10-CM | POA: Diagnosis not present

## 2021-07-02 DIAGNOSIS — G309 Alzheimer's disease, unspecified: Secondary | ICD-10-CM | POA: Diagnosis not present

## 2021-07-02 DIAGNOSIS — M6281 Muscle weakness (generalized): Secondary | ICD-10-CM | POA: Diagnosis not present

## 2021-07-02 DIAGNOSIS — Z20822 Contact with and (suspected) exposure to covid-19: Secondary | ICD-10-CM | POA: Diagnosis not present

## 2021-07-02 DIAGNOSIS — N189 Chronic kidney disease, unspecified: Secondary | ICD-10-CM | POA: Diagnosis not present

## 2021-07-06 DIAGNOSIS — D518 Other vitamin B12 deficiency anemias: Secondary | ICD-10-CM | POA: Diagnosis not present

## 2021-07-06 DIAGNOSIS — G309 Alzheimer's disease, unspecified: Secondary | ICD-10-CM | POA: Diagnosis not present

## 2021-07-06 DIAGNOSIS — R2681 Unsteadiness on feet: Secondary | ICD-10-CM | POA: Diagnosis not present

## 2021-07-06 DIAGNOSIS — E559 Vitamin D deficiency, unspecified: Secondary | ICD-10-CM | POA: Diagnosis not present

## 2021-07-06 DIAGNOSIS — I10 Essential (primary) hypertension: Secondary | ICD-10-CM | POA: Diagnosis not present

## 2021-07-06 DIAGNOSIS — N183 Chronic kidney disease, stage 3 unspecified: Secondary | ICD-10-CM | POA: Diagnosis not present

## 2021-07-06 DIAGNOSIS — E785 Hyperlipidemia, unspecified: Secondary | ICD-10-CM | POA: Diagnosis not present

## 2021-07-06 DIAGNOSIS — Z9181 History of falling: Secondary | ICD-10-CM | POA: Diagnosis not present

## 2021-07-06 DIAGNOSIS — E038 Other specified hypothyroidism: Secondary | ICD-10-CM | POA: Diagnosis not present

## 2021-07-06 DIAGNOSIS — N189 Chronic kidney disease, unspecified: Secondary | ICD-10-CM | POA: Diagnosis not present

## 2021-07-06 DIAGNOSIS — M1990 Unspecified osteoarthritis, unspecified site: Secondary | ICD-10-CM | POA: Diagnosis not present

## 2021-07-06 DIAGNOSIS — E039 Hypothyroidism, unspecified: Secondary | ICD-10-CM | POA: Diagnosis not present

## 2021-07-06 DIAGNOSIS — E119 Type 2 diabetes mellitus without complications: Secondary | ICD-10-CM | POA: Diagnosis not present

## 2021-07-06 DIAGNOSIS — M6281 Muscle weakness (generalized): Secondary | ICD-10-CM | POA: Diagnosis not present

## 2021-07-06 DIAGNOSIS — F0280 Dementia in other diseases classified elsewhere without behavioral disturbance: Secondary | ICD-10-CM | POA: Diagnosis not present

## 2021-07-08 DIAGNOSIS — E039 Hypothyroidism, unspecified: Secondary | ICD-10-CM | POA: Diagnosis not present

## 2021-07-08 DIAGNOSIS — E119 Type 2 diabetes mellitus without complications: Secondary | ICD-10-CM | POA: Diagnosis not present

## 2021-07-08 DIAGNOSIS — D518 Other vitamin B12 deficiency anemias: Secondary | ICD-10-CM | POA: Diagnosis not present

## 2021-07-08 DIAGNOSIS — E559 Vitamin D deficiency, unspecified: Secondary | ICD-10-CM | POA: Diagnosis not present

## 2021-07-08 DIAGNOSIS — Z79899 Other long term (current) drug therapy: Secondary | ICD-10-CM | POA: Diagnosis not present

## 2021-07-09 DIAGNOSIS — E871 Hypo-osmolality and hyponatremia: Secondary | ICD-10-CM | POA: Diagnosis not present

## 2021-07-09 DIAGNOSIS — Z79899 Other long term (current) drug therapy: Secondary | ICD-10-CM | POA: Diagnosis not present

## 2021-07-09 DIAGNOSIS — N179 Acute kidney failure, unspecified: Secondary | ICD-10-CM | POA: Diagnosis not present

## 2021-07-09 DIAGNOSIS — Z5321 Procedure and treatment not carried out due to patient leaving prior to being seen by health care provider: Secondary | ICD-10-CM | POA: Diagnosis not present

## 2021-07-09 DIAGNOSIS — N289 Disorder of kidney and ureter, unspecified: Secondary | ICD-10-CM | POA: Diagnosis not present

## 2021-07-09 DIAGNOSIS — R5383 Other fatigue: Secondary | ICD-10-CM | POA: Diagnosis not present

## 2021-07-09 DIAGNOSIS — R531 Weakness: Secondary | ICD-10-CM | POA: Diagnosis not present

## 2021-07-09 DIAGNOSIS — R001 Bradycardia, unspecified: Secondary | ICD-10-CM | POA: Diagnosis not present

## 2021-07-09 DIAGNOSIS — R197 Diarrhea, unspecified: Secondary | ICD-10-CM | POA: Diagnosis not present

## 2021-07-09 DIAGNOSIS — E872 Acidosis: Secondary | ICD-10-CM | POA: Diagnosis not present

## 2021-07-09 DIAGNOSIS — F0390 Unspecified dementia without behavioral disturbance: Secondary | ICD-10-CM | POA: Diagnosis not present

## 2021-07-09 DIAGNOSIS — Z66 Do not resuscitate: Secondary | ICD-10-CM | POA: Diagnosis not present

## 2021-07-09 DIAGNOSIS — E875 Hyperkalemia: Secondary | ICD-10-CM | POA: Diagnosis not present

## 2021-07-09 DIAGNOSIS — M5134 Other intervertebral disc degeneration, thoracic region: Secondary | ICD-10-CM | POA: Diagnosis not present

## 2021-07-09 DIAGNOSIS — R300 Dysuria: Secondary | ICD-10-CM | POA: Diagnosis not present

## 2021-07-10 DIAGNOSIS — Z79899 Other long term (current) drug therapy: Secondary | ICD-10-CM | POA: Diagnosis not present

## 2021-07-10 DIAGNOSIS — N189 Chronic kidney disease, unspecified: Secondary | ICD-10-CM | POA: Diagnosis not present

## 2021-07-10 DIAGNOSIS — Z981 Arthrodesis status: Secondary | ICD-10-CM | POA: Diagnosis not present

## 2021-07-10 DIAGNOSIS — D649 Anemia, unspecified: Secondary | ICD-10-CM | POA: Diagnosis present

## 2021-07-10 DIAGNOSIS — Z8673 Personal history of transient ischemic attack (TIA), and cerebral infarction without residual deficits: Secondary | ICD-10-CM | POA: Diagnosis not present

## 2021-07-10 DIAGNOSIS — I120 Hypertensive chronic kidney disease with stage 5 chronic kidney disease or end stage renal disease: Secondary | ICD-10-CM | POA: Diagnosis not present

## 2021-07-10 DIAGNOSIS — I4510 Unspecified right bundle-branch block: Secondary | ICD-10-CM | POA: Diagnosis present

## 2021-07-10 DIAGNOSIS — N179 Acute kidney failure, unspecified: Secondary | ICD-10-CM | POA: Diagnosis present

## 2021-07-10 DIAGNOSIS — I129 Hypertensive chronic kidney disease with stage 1 through stage 4 chronic kidney disease, or unspecified chronic kidney disease: Secondary | ICD-10-CM | POA: Diagnosis present

## 2021-07-10 DIAGNOSIS — Z7901 Long term (current) use of anticoagulants: Secondary | ICD-10-CM | POA: Diagnosis not present

## 2021-07-10 DIAGNOSIS — F0390 Unspecified dementia without behavioral disturbance: Secondary | ICD-10-CM | POA: Diagnosis present

## 2021-07-10 DIAGNOSIS — E872 Acidosis: Secondary | ICD-10-CM | POA: Diagnosis present

## 2021-07-10 DIAGNOSIS — E871 Hypo-osmolality and hyponatremia: Secondary | ICD-10-CM | POA: Diagnosis present

## 2021-07-10 DIAGNOSIS — N281 Cyst of kidney, acquired: Secondary | ICD-10-CM | POA: Diagnosis not present

## 2021-07-10 DIAGNOSIS — N183 Chronic kidney disease, stage 3 unspecified: Secondary | ICD-10-CM | POA: Diagnosis present

## 2021-07-10 DIAGNOSIS — Z66 Do not resuscitate: Secondary | ICD-10-CM | POA: Diagnosis present

## 2021-07-10 DIAGNOSIS — I440 Atrioventricular block, first degree: Secondary | ICD-10-CM | POA: Diagnosis present

## 2021-07-10 DIAGNOSIS — N182 Chronic kidney disease, stage 2 (mild): Secondary | ICD-10-CM | POA: Diagnosis not present

## 2021-07-10 DIAGNOSIS — E875 Hyperkalemia: Secondary | ICD-10-CM | POA: Diagnosis present

## 2021-07-10 DIAGNOSIS — J449 Chronic obstructive pulmonary disease, unspecified: Secondary | ICD-10-CM | POA: Diagnosis present

## 2021-07-10 DIAGNOSIS — N289 Disorder of kidney and ureter, unspecified: Secondary | ICD-10-CM | POA: Diagnosis not present

## 2021-07-10 DIAGNOSIS — Z992 Dependence on renal dialysis: Secondary | ICD-10-CM | POA: Diagnosis not present

## 2021-07-10 DIAGNOSIS — Z7982 Long term (current) use of aspirin: Secondary | ICD-10-CM | POA: Diagnosis not present

## 2021-07-10 DIAGNOSIS — Z90710 Acquired absence of both cervix and uterus: Secondary | ICD-10-CM | POA: Diagnosis not present

## 2021-07-10 DIAGNOSIS — K3189 Other diseases of stomach and duodenum: Secondary | ICD-10-CM | POA: Diagnosis not present

## 2021-07-10 DIAGNOSIS — R001 Bradycardia, unspecified: Secondary | ICD-10-CM | POA: Diagnosis not present

## 2021-07-10 DIAGNOSIS — Z9049 Acquired absence of other specified parts of digestive tract: Secondary | ICD-10-CM | POA: Diagnosis not present

## 2021-07-13 DIAGNOSIS — M1990 Unspecified osteoarthritis, unspecified site: Secondary | ICD-10-CM | POA: Diagnosis not present

## 2021-07-13 DIAGNOSIS — N189 Chronic kidney disease, unspecified: Secondary | ICD-10-CM | POA: Diagnosis not present

## 2021-07-13 DIAGNOSIS — R2681 Unsteadiness on feet: Secondary | ICD-10-CM | POA: Diagnosis not present

## 2021-07-13 DIAGNOSIS — M6281 Muscle weakness (generalized): Secondary | ICD-10-CM | POA: Diagnosis not present

## 2021-07-13 DIAGNOSIS — Z9181 History of falling: Secondary | ICD-10-CM | POA: Diagnosis not present

## 2021-07-13 DIAGNOSIS — G309 Alzheimer's disease, unspecified: Secondary | ICD-10-CM | POA: Diagnosis not present

## 2021-07-15 DIAGNOSIS — M6281 Muscle weakness (generalized): Secondary | ICD-10-CM | POA: Diagnosis not present

## 2021-07-15 DIAGNOSIS — Z9181 History of falling: Secondary | ICD-10-CM | POA: Diagnosis not present

## 2021-07-15 DIAGNOSIS — D518 Other vitamin B12 deficiency anemias: Secondary | ICD-10-CM | POA: Diagnosis not present

## 2021-07-15 DIAGNOSIS — E7849 Other hyperlipidemia: Secondary | ICD-10-CM | POA: Diagnosis not present

## 2021-07-15 DIAGNOSIS — E119 Type 2 diabetes mellitus without complications: Secondary | ICD-10-CM | POA: Diagnosis not present

## 2021-07-15 DIAGNOSIS — M1990 Unspecified osteoarthritis, unspecified site: Secondary | ICD-10-CM | POA: Diagnosis not present

## 2021-07-15 DIAGNOSIS — G309 Alzheimer's disease, unspecified: Secondary | ICD-10-CM | POA: Diagnosis not present

## 2021-07-15 DIAGNOSIS — N189 Chronic kidney disease, unspecified: Secondary | ICD-10-CM | POA: Diagnosis not present

## 2021-07-15 DIAGNOSIS — R2681 Unsteadiness on feet: Secondary | ICD-10-CM | POA: Diagnosis not present

## 2021-07-15 DIAGNOSIS — Z79899 Other long term (current) drug therapy: Secondary | ICD-10-CM | POA: Diagnosis not present

## 2021-07-19 DIAGNOSIS — Z9181 History of falling: Secondary | ICD-10-CM | POA: Diagnosis not present

## 2021-07-19 DIAGNOSIS — M6281 Muscle weakness (generalized): Secondary | ICD-10-CM | POA: Diagnosis not present

## 2021-07-19 DIAGNOSIS — R2681 Unsteadiness on feet: Secondary | ICD-10-CM | POA: Diagnosis not present

## 2021-07-19 DIAGNOSIS — M1990 Unspecified osteoarthritis, unspecified site: Secondary | ICD-10-CM | POA: Diagnosis not present

## 2021-07-19 DIAGNOSIS — G309 Alzheimer's disease, unspecified: Secondary | ICD-10-CM | POA: Diagnosis not present

## 2021-07-19 DIAGNOSIS — N189 Chronic kidney disease, unspecified: Secondary | ICD-10-CM | POA: Diagnosis not present

## 2021-07-19 DIAGNOSIS — F419 Anxiety disorder, unspecified: Secondary | ICD-10-CM | POA: Diagnosis not present

## 2021-07-19 DIAGNOSIS — F32A Depression, unspecified: Secondary | ICD-10-CM | POA: Diagnosis not present

## 2021-07-23 DIAGNOSIS — D519 Vitamin B12 deficiency anemia, unspecified: Secondary | ICD-10-CM | POA: Diagnosis not present

## 2021-07-23 DIAGNOSIS — E7800 Pure hypercholesterolemia, unspecified: Secondary | ICD-10-CM | POA: Diagnosis not present

## 2021-07-23 DIAGNOSIS — E875 Hyperkalemia: Secondary | ICD-10-CM | POA: Diagnosis not present

## 2021-07-23 DIAGNOSIS — Z20822 Contact with and (suspected) exposure to covid-19: Secondary | ICD-10-CM | POA: Diagnosis not present

## 2021-07-23 DIAGNOSIS — F32A Depression, unspecified: Secondary | ICD-10-CM | POA: Diagnosis not present

## 2021-07-23 DIAGNOSIS — Z9181 History of falling: Secondary | ICD-10-CM | POA: Diagnosis not present

## 2021-07-23 DIAGNOSIS — E039 Hypothyroidism, unspecified: Secondary | ICD-10-CM | POA: Diagnosis not present

## 2021-07-23 DIAGNOSIS — R2681 Unsteadiness on feet: Secondary | ICD-10-CM | POA: Diagnosis not present

## 2021-07-23 DIAGNOSIS — M6281 Muscle weakness (generalized): Secondary | ICD-10-CM | POA: Diagnosis not present

## 2021-07-23 DIAGNOSIS — Z7901 Long term (current) use of anticoagulants: Secondary | ICD-10-CM | POA: Diagnosis not present

## 2021-07-23 DIAGNOSIS — F419 Anxiety disorder, unspecified: Secondary | ICD-10-CM | POA: Diagnosis not present

## 2021-07-23 DIAGNOSIS — Z741 Need for assistance with personal care: Secondary | ICD-10-CM | POA: Diagnosis not present

## 2021-07-23 DIAGNOSIS — G309 Alzheimer's disease, unspecified: Secondary | ICD-10-CM | POA: Diagnosis not present

## 2021-07-23 DIAGNOSIS — M1990 Unspecified osteoarthritis, unspecified site: Secondary | ICD-10-CM | POA: Diagnosis not present

## 2021-07-23 DIAGNOSIS — I10 Essential (primary) hypertension: Secondary | ICD-10-CM | POA: Diagnosis not present

## 2021-07-23 DIAGNOSIS — Z7902 Long term (current) use of antithrombotics/antiplatelets: Secondary | ICD-10-CM | POA: Diagnosis not present

## 2021-07-23 DIAGNOSIS — K219 Gastro-esophageal reflux disease without esophagitis: Secondary | ICD-10-CM | POA: Diagnosis not present

## 2021-07-23 DIAGNOSIS — N189 Chronic kidney disease, unspecified: Secondary | ICD-10-CM | POA: Diagnosis not present

## 2021-07-27 DIAGNOSIS — I10 Essential (primary) hypertension: Secondary | ICD-10-CM | POA: Diagnosis not present

## 2021-07-27 DIAGNOSIS — Z9181 History of falling: Secondary | ICD-10-CM | POA: Diagnosis not present

## 2021-07-27 DIAGNOSIS — M1990 Unspecified osteoarthritis, unspecified site: Secondary | ICD-10-CM | POA: Diagnosis not present

## 2021-07-27 DIAGNOSIS — G309 Alzheimer's disease, unspecified: Secondary | ICD-10-CM | POA: Diagnosis not present

## 2021-07-27 DIAGNOSIS — R2681 Unsteadiness on feet: Secondary | ICD-10-CM | POA: Diagnosis not present

## 2021-07-27 DIAGNOSIS — N189 Chronic kidney disease, unspecified: Secondary | ICD-10-CM | POA: Diagnosis not present

## 2021-07-27 DIAGNOSIS — M6281 Muscle weakness (generalized): Secondary | ICD-10-CM | POA: Diagnosis not present

## 2021-07-29 DIAGNOSIS — R2681 Unsteadiness on feet: Secondary | ICD-10-CM | POA: Diagnosis not present

## 2021-07-29 DIAGNOSIS — M6281 Muscle weakness (generalized): Secondary | ICD-10-CM | POA: Diagnosis not present

## 2021-07-29 DIAGNOSIS — N189 Chronic kidney disease, unspecified: Secondary | ICD-10-CM | POA: Diagnosis not present

## 2021-07-29 DIAGNOSIS — Z9181 History of falling: Secondary | ICD-10-CM | POA: Diagnosis not present

## 2021-07-29 DIAGNOSIS — M1990 Unspecified osteoarthritis, unspecified site: Secondary | ICD-10-CM | POA: Diagnosis not present

## 2021-07-29 DIAGNOSIS — G309 Alzheimer's disease, unspecified: Secondary | ICD-10-CM | POA: Diagnosis not present

## 2021-07-30 DIAGNOSIS — H547 Unspecified visual loss: Secondary | ICD-10-CM | POA: Diagnosis not present

## 2021-07-30 DIAGNOSIS — E7849 Other hyperlipidemia: Secondary | ICD-10-CM | POA: Diagnosis not present

## 2021-07-30 DIAGNOSIS — D518 Other vitamin B12 deficiency anemias: Secondary | ICD-10-CM | POA: Diagnosis not present

## 2021-07-30 DIAGNOSIS — E119 Type 2 diabetes mellitus without complications: Secondary | ICD-10-CM | POA: Diagnosis not present

## 2021-07-30 DIAGNOSIS — E559 Vitamin D deficiency, unspecified: Secondary | ICD-10-CM | POA: Diagnosis not present

## 2021-07-30 DIAGNOSIS — R600 Localized edema: Secondary | ICD-10-CM | POA: Diagnosis not present

## 2021-07-30 DIAGNOSIS — N189 Chronic kidney disease, unspecified: Secondary | ICD-10-CM | POA: Diagnosis not present

## 2021-07-30 DIAGNOSIS — E0590 Thyrotoxicosis, unspecified without thyrotoxic crisis or storm: Secondary | ICD-10-CM | POA: Diagnosis not present

## 2021-07-30 DIAGNOSIS — K219 Gastro-esophageal reflux disease without esophagitis: Secondary | ICD-10-CM | POA: Diagnosis not present

## 2021-07-30 DIAGNOSIS — E875 Hyperkalemia: Secondary | ICD-10-CM | POA: Diagnosis not present

## 2021-07-30 DIAGNOSIS — N179 Acute kidney failure, unspecified: Secondary | ICD-10-CM | POA: Diagnosis not present

## 2021-07-30 DIAGNOSIS — Z79899 Other long term (current) drug therapy: Secondary | ICD-10-CM | POA: Diagnosis not present

## 2021-07-30 DIAGNOSIS — M1990 Unspecified osteoarthritis, unspecified site: Secondary | ICD-10-CM | POA: Diagnosis not present

## 2021-07-30 DIAGNOSIS — B351 Tinea unguium: Secondary | ICD-10-CM | POA: Diagnosis not present

## 2021-08-03 DIAGNOSIS — G309 Alzheimer's disease, unspecified: Secondary | ICD-10-CM | POA: Diagnosis not present

## 2021-08-03 DIAGNOSIS — R2681 Unsteadiness on feet: Secondary | ICD-10-CM | POA: Diagnosis not present

## 2021-08-03 DIAGNOSIS — M6281 Muscle weakness (generalized): Secondary | ICD-10-CM | POA: Diagnosis not present

## 2021-08-03 DIAGNOSIS — M1990 Unspecified osteoarthritis, unspecified site: Secondary | ICD-10-CM | POA: Diagnosis not present

## 2021-08-03 DIAGNOSIS — N189 Chronic kidney disease, unspecified: Secondary | ICD-10-CM | POA: Diagnosis not present

## 2021-08-03 DIAGNOSIS — Z9181 History of falling: Secondary | ICD-10-CM | POA: Diagnosis not present

## 2021-08-05 DIAGNOSIS — M6281 Muscle weakness (generalized): Secondary | ICD-10-CM | POA: Diagnosis not present

## 2021-08-05 DIAGNOSIS — M1990 Unspecified osteoarthritis, unspecified site: Secondary | ICD-10-CM | POA: Diagnosis not present

## 2021-08-05 DIAGNOSIS — G309 Alzheimer's disease, unspecified: Secondary | ICD-10-CM | POA: Diagnosis not present

## 2021-08-05 DIAGNOSIS — R2681 Unsteadiness on feet: Secondary | ICD-10-CM | POA: Diagnosis not present

## 2021-08-05 DIAGNOSIS — Z9181 History of falling: Secondary | ICD-10-CM | POA: Diagnosis not present

## 2021-08-05 DIAGNOSIS — N189 Chronic kidney disease, unspecified: Secondary | ICD-10-CM | POA: Diagnosis not present

## 2021-08-09 DIAGNOSIS — M6281 Muscle weakness (generalized): Secondary | ICD-10-CM | POA: Diagnosis not present

## 2021-08-09 DIAGNOSIS — R2681 Unsteadiness on feet: Secondary | ICD-10-CM | POA: Diagnosis not present

## 2021-08-09 DIAGNOSIS — N189 Chronic kidney disease, unspecified: Secondary | ICD-10-CM | POA: Diagnosis not present

## 2021-08-09 DIAGNOSIS — Z9181 History of falling: Secondary | ICD-10-CM | POA: Diagnosis not present

## 2021-08-09 DIAGNOSIS — G309 Alzheimer's disease, unspecified: Secondary | ICD-10-CM | POA: Diagnosis not present

## 2021-08-09 DIAGNOSIS — M1990 Unspecified osteoarthritis, unspecified site: Secondary | ICD-10-CM | POA: Diagnosis not present

## 2021-08-12 DIAGNOSIS — G309 Alzheimer's disease, unspecified: Secondary | ICD-10-CM | POA: Diagnosis not present

## 2021-08-12 DIAGNOSIS — N189 Chronic kidney disease, unspecified: Secondary | ICD-10-CM | POA: Diagnosis not present

## 2021-08-12 DIAGNOSIS — Z9181 History of falling: Secondary | ICD-10-CM | POA: Diagnosis not present

## 2021-08-12 DIAGNOSIS — M6281 Muscle weakness (generalized): Secondary | ICD-10-CM | POA: Diagnosis not present

## 2021-08-12 DIAGNOSIS — R300 Dysuria: Secondary | ICD-10-CM | POA: Diagnosis not present

## 2021-08-12 DIAGNOSIS — R2681 Unsteadiness on feet: Secondary | ICD-10-CM | POA: Diagnosis not present

## 2021-08-12 DIAGNOSIS — M1990 Unspecified osteoarthritis, unspecified site: Secondary | ICD-10-CM | POA: Diagnosis not present

## 2021-08-17 DIAGNOSIS — N189 Chronic kidney disease, unspecified: Secondary | ICD-10-CM | POA: Diagnosis not present

## 2021-08-17 DIAGNOSIS — M6281 Muscle weakness (generalized): Secondary | ICD-10-CM | POA: Diagnosis not present

## 2021-08-17 DIAGNOSIS — G309 Alzheimer's disease, unspecified: Secondary | ICD-10-CM | POA: Diagnosis not present

## 2021-08-17 DIAGNOSIS — M1990 Unspecified osteoarthritis, unspecified site: Secondary | ICD-10-CM | POA: Diagnosis not present

## 2021-08-17 DIAGNOSIS — Z9181 History of falling: Secondary | ICD-10-CM | POA: Diagnosis not present

## 2021-08-17 DIAGNOSIS — R2681 Unsteadiness on feet: Secondary | ICD-10-CM | POA: Diagnosis not present

## 2021-08-19 DIAGNOSIS — M1990 Unspecified osteoarthritis, unspecified site: Secondary | ICD-10-CM | POA: Diagnosis not present

## 2021-08-19 DIAGNOSIS — M6281 Muscle weakness (generalized): Secondary | ICD-10-CM | POA: Diagnosis not present

## 2021-08-19 DIAGNOSIS — Z9181 History of falling: Secondary | ICD-10-CM | POA: Diagnosis not present

## 2021-08-19 DIAGNOSIS — N189 Chronic kidney disease, unspecified: Secondary | ICD-10-CM | POA: Diagnosis not present

## 2021-08-19 DIAGNOSIS — G309 Alzheimer's disease, unspecified: Secondary | ICD-10-CM | POA: Diagnosis not present

## 2021-08-19 DIAGNOSIS — R2681 Unsteadiness on feet: Secondary | ICD-10-CM | POA: Diagnosis not present

## 2021-08-22 DIAGNOSIS — F0280 Dementia in other diseases classified elsewhere without behavioral disturbance: Secondary | ICD-10-CM | POA: Diagnosis not present

## 2021-08-22 DIAGNOSIS — E559 Vitamin D deficiency, unspecified: Secondary | ICD-10-CM | POA: Diagnosis not present

## 2021-08-22 DIAGNOSIS — N189 Chronic kidney disease, unspecified: Secondary | ICD-10-CM | POA: Diagnosis not present

## 2021-08-22 DIAGNOSIS — E119 Type 2 diabetes mellitus without complications: Secondary | ICD-10-CM | POA: Diagnosis not present

## 2021-08-22 DIAGNOSIS — I10 Essential (primary) hypertension: Secondary | ICD-10-CM | POA: Diagnosis not present

## 2021-08-22 DIAGNOSIS — E785 Hyperlipidemia, unspecified: Secondary | ICD-10-CM | POA: Diagnosis not present

## 2021-08-22 DIAGNOSIS — D518 Other vitamin B12 deficiency anemias: Secondary | ICD-10-CM | POA: Diagnosis not present

## 2021-08-22 DIAGNOSIS — M1990 Unspecified osteoarthritis, unspecified site: Secondary | ICD-10-CM | POA: Diagnosis not present

## 2021-08-22 DIAGNOSIS — E038 Other specified hypothyroidism: Secondary | ICD-10-CM | POA: Diagnosis not present

## 2021-08-22 DIAGNOSIS — N183 Chronic kidney disease, stage 3 unspecified: Secondary | ICD-10-CM | POA: Diagnosis not present

## 2021-08-22 DIAGNOSIS — E039 Hypothyroidism, unspecified: Secondary | ICD-10-CM | POA: Diagnosis not present

## 2021-08-26 DIAGNOSIS — I10 Essential (primary) hypertension: Secondary | ICD-10-CM | POA: Diagnosis not present

## 2021-08-26 DIAGNOSIS — Z20822 Contact with and (suspected) exposure to covid-19: Secondary | ICD-10-CM | POA: Diagnosis not present

## 2021-08-27 DIAGNOSIS — I10 Essential (primary) hypertension: Secondary | ICD-10-CM | POA: Diagnosis not present

## 2021-08-27 DIAGNOSIS — B351 Tinea unguium: Secondary | ICD-10-CM | POA: Diagnosis not present

## 2021-08-27 DIAGNOSIS — M1990 Unspecified osteoarthritis, unspecified site: Secondary | ICD-10-CM | POA: Diagnosis not present

## 2021-08-27 DIAGNOSIS — N390 Urinary tract infection, site not specified: Secondary | ICD-10-CM | POA: Diagnosis not present

## 2021-08-27 DIAGNOSIS — N189 Chronic kidney disease, unspecified: Secondary | ICD-10-CM | POA: Diagnosis not present

## 2021-08-27 DIAGNOSIS — E559 Vitamin D deficiency, unspecified: Secondary | ICD-10-CM | POA: Diagnosis not present

## 2021-08-27 DIAGNOSIS — K219 Gastro-esophageal reflux disease without esophagitis: Secondary | ICD-10-CM | POA: Diagnosis not present

## 2021-08-27 DIAGNOSIS — L84 Corns and callosities: Secondary | ICD-10-CM | POA: Diagnosis not present

## 2021-09-06 DIAGNOSIS — L84 Corns and callosities: Secondary | ICD-10-CM | POA: Diagnosis not present

## 2021-09-06 DIAGNOSIS — M2041 Other hammer toe(s) (acquired), right foot: Secondary | ICD-10-CM | POA: Diagnosis not present

## 2021-09-06 DIAGNOSIS — B351 Tinea unguium: Secondary | ICD-10-CM | POA: Diagnosis not present

## 2021-09-20 DIAGNOSIS — E038 Other specified hypothyroidism: Secondary | ICD-10-CM | POA: Diagnosis not present

## 2021-09-20 DIAGNOSIS — D518 Other vitamin B12 deficiency anemias: Secondary | ICD-10-CM | POA: Diagnosis not present

## 2021-09-20 DIAGNOSIS — M1990 Unspecified osteoarthritis, unspecified site: Secondary | ICD-10-CM | POA: Diagnosis not present

## 2021-09-20 DIAGNOSIS — I10 Essential (primary) hypertension: Secondary | ICD-10-CM | POA: Diagnosis not present

## 2021-09-20 DIAGNOSIS — N183 Chronic kidney disease, stage 3 unspecified: Secondary | ICD-10-CM | POA: Diagnosis not present

## 2021-09-20 DIAGNOSIS — F0280 Dementia in other diseases classified elsewhere without behavioral disturbance: Secondary | ICD-10-CM | POA: Diagnosis not present

## 2021-09-20 DIAGNOSIS — E119 Type 2 diabetes mellitus without complications: Secondary | ICD-10-CM | POA: Diagnosis not present

## 2021-09-20 DIAGNOSIS — E785 Hyperlipidemia, unspecified: Secondary | ICD-10-CM | POA: Diagnosis not present

## 2021-09-20 DIAGNOSIS — E039 Hypothyroidism, unspecified: Secondary | ICD-10-CM | POA: Diagnosis not present

## 2021-09-20 DIAGNOSIS — E559 Vitamin D deficiency, unspecified: Secondary | ICD-10-CM | POA: Diagnosis not present

## 2021-09-20 DIAGNOSIS — N189 Chronic kidney disease, unspecified: Secondary | ICD-10-CM | POA: Diagnosis not present

## 2021-10-01 DIAGNOSIS — D649 Anemia, unspecified: Secondary | ICD-10-CM | POA: Diagnosis not present

## 2021-10-01 DIAGNOSIS — R451 Restlessness and agitation: Secondary | ICD-10-CM | POA: Diagnosis not present

## 2021-10-01 DIAGNOSIS — N189 Chronic kidney disease, unspecified: Secondary | ICD-10-CM | POA: Diagnosis not present

## 2021-10-01 DIAGNOSIS — L84 Corns and callosities: Secondary | ICD-10-CM | POA: Diagnosis not present

## 2021-10-01 DIAGNOSIS — E559 Vitamin D deficiency, unspecified: Secondary | ICD-10-CM | POA: Diagnosis not present

## 2021-10-01 DIAGNOSIS — B351 Tinea unguium: Secondary | ICD-10-CM | POA: Diagnosis not present

## 2021-10-01 DIAGNOSIS — R5381 Other malaise: Secondary | ICD-10-CM | POA: Diagnosis not present

## 2021-10-01 DIAGNOSIS — K219 Gastro-esophageal reflux disease without esophagitis: Secondary | ICD-10-CM | POA: Diagnosis not present

## 2021-10-01 DIAGNOSIS — R600 Localized edema: Secondary | ICD-10-CM | POA: Diagnosis not present

## 2021-10-01 DIAGNOSIS — I10 Essential (primary) hypertension: Secondary | ICD-10-CM | POA: Diagnosis not present

## 2021-10-04 DIAGNOSIS — Z79899 Other long term (current) drug therapy: Secondary | ICD-10-CM | POA: Diagnosis not present

## 2021-10-04 DIAGNOSIS — E119 Type 2 diabetes mellitus without complications: Secondary | ICD-10-CM | POA: Diagnosis not present

## 2021-10-04 DIAGNOSIS — D518 Other vitamin B12 deficiency anemias: Secondary | ICD-10-CM | POA: Diagnosis not present

## 2021-10-04 DIAGNOSIS — E038 Other specified hypothyroidism: Secondary | ICD-10-CM | POA: Diagnosis not present

## 2021-10-04 DIAGNOSIS — E7849 Other hyperlipidemia: Secondary | ICD-10-CM | POA: Diagnosis not present

## 2021-10-04 DIAGNOSIS — R300 Dysuria: Secondary | ICD-10-CM | POA: Diagnosis not present

## 2021-10-04 DIAGNOSIS — E559 Vitamin D deficiency, unspecified: Secondary | ICD-10-CM | POA: Diagnosis not present

## 2021-10-07 DIAGNOSIS — Z23 Encounter for immunization: Secondary | ICD-10-CM | POA: Diagnosis not present

## 2021-10-11 DIAGNOSIS — I10 Essential (primary) hypertension: Secondary | ICD-10-CM | POA: Diagnosis not present

## 2021-10-11 DIAGNOSIS — M1990 Unspecified osteoarthritis, unspecified site: Secondary | ICD-10-CM | POA: Diagnosis not present

## 2021-10-11 DIAGNOSIS — D518 Other vitamin B12 deficiency anemias: Secondary | ICD-10-CM | POA: Diagnosis not present

## 2021-10-11 DIAGNOSIS — N183 Chronic kidney disease, stage 3 unspecified: Secondary | ICD-10-CM | POA: Diagnosis not present

## 2021-10-11 DIAGNOSIS — E559 Vitamin D deficiency, unspecified: Secondary | ICD-10-CM | POA: Diagnosis not present

## 2021-10-11 DIAGNOSIS — N189 Chronic kidney disease, unspecified: Secondary | ICD-10-CM | POA: Diagnosis not present

## 2021-10-11 DIAGNOSIS — E119 Type 2 diabetes mellitus without complications: Secondary | ICD-10-CM | POA: Diagnosis not present

## 2021-10-11 DIAGNOSIS — E039 Hypothyroidism, unspecified: Secondary | ICD-10-CM | POA: Diagnosis not present

## 2021-10-11 DIAGNOSIS — E785 Hyperlipidemia, unspecified: Secondary | ICD-10-CM | POA: Diagnosis not present

## 2021-10-11 DIAGNOSIS — F0280 Dementia in other diseases classified elsewhere without behavioral disturbance: Secondary | ICD-10-CM | POA: Diagnosis not present

## 2021-10-11 DIAGNOSIS — E038 Other specified hypothyroidism: Secondary | ICD-10-CM | POA: Diagnosis not present

## 2021-10-13 DIAGNOSIS — Z79899 Other long term (current) drug therapy: Secondary | ICD-10-CM | POA: Diagnosis not present

## 2021-10-13 DIAGNOSIS — E119 Type 2 diabetes mellitus without complications: Secondary | ICD-10-CM | POA: Diagnosis not present

## 2021-10-13 DIAGNOSIS — D518 Other vitamin B12 deficiency anemias: Secondary | ICD-10-CM | POA: Diagnosis not present

## 2021-10-13 DIAGNOSIS — E7849 Other hyperlipidemia: Secondary | ICD-10-CM | POA: Diagnosis not present

## 2021-10-20 DIAGNOSIS — D518 Other vitamin B12 deficiency anemias: Secondary | ICD-10-CM | POA: Diagnosis not present

## 2021-10-20 DIAGNOSIS — Z79899 Other long term (current) drug therapy: Secondary | ICD-10-CM | POA: Diagnosis not present

## 2021-10-20 DIAGNOSIS — E7849 Other hyperlipidemia: Secondary | ICD-10-CM | POA: Diagnosis not present

## 2021-10-20 DIAGNOSIS — E119 Type 2 diabetes mellitus without complications: Secondary | ICD-10-CM | POA: Diagnosis not present

## 2021-10-22 DIAGNOSIS — D649 Anemia, unspecified: Secondary | ICD-10-CM | POA: Diagnosis not present

## 2021-10-22 DIAGNOSIS — E559 Vitamin D deficiency, unspecified: Secondary | ICD-10-CM | POA: Diagnosis not present

## 2021-10-22 DIAGNOSIS — B351 Tinea unguium: Secondary | ICD-10-CM | POA: Diagnosis not present

## 2021-10-22 DIAGNOSIS — R451 Restlessness and agitation: Secondary | ICD-10-CM | POA: Diagnosis not present

## 2021-10-22 DIAGNOSIS — N189 Chronic kidney disease, unspecified: Secondary | ICD-10-CM | POA: Diagnosis not present

## 2021-10-22 DIAGNOSIS — I10 Essential (primary) hypertension: Secondary | ICD-10-CM | POA: Diagnosis not present

## 2021-10-22 DIAGNOSIS — K589 Irritable bowel syndrome without diarrhea: Secondary | ICD-10-CM | POA: Diagnosis not present

## 2021-10-22 DIAGNOSIS — M1990 Unspecified osteoarthritis, unspecified site: Secondary | ICD-10-CM | POA: Diagnosis not present

## 2021-10-22 DIAGNOSIS — E0590 Thyrotoxicosis, unspecified without thyrotoxic crisis or storm: Secondary | ICD-10-CM | POA: Diagnosis not present

## 2021-10-22 DIAGNOSIS — L84 Corns and callosities: Secondary | ICD-10-CM | POA: Diagnosis not present

## 2021-10-22 DIAGNOSIS — E785 Hyperlipidemia, unspecified: Secondary | ICD-10-CM | POA: Diagnosis not present

## 2021-10-22 DIAGNOSIS — K219 Gastro-esophageal reflux disease without esophagitis: Secondary | ICD-10-CM | POA: Diagnosis not present

## 2021-10-29 DIAGNOSIS — E7849 Other hyperlipidemia: Secondary | ICD-10-CM | POA: Diagnosis not present

## 2021-10-29 DIAGNOSIS — D518 Other vitamin B12 deficiency anemias: Secondary | ICD-10-CM | POA: Diagnosis not present

## 2021-10-29 DIAGNOSIS — D649 Anemia, unspecified: Secondary | ICD-10-CM | POA: Diagnosis not present

## 2021-10-29 DIAGNOSIS — K219 Gastro-esophageal reflux disease without esophagitis: Secondary | ICD-10-CM | POA: Diagnosis not present

## 2021-10-29 DIAGNOSIS — B351 Tinea unguium: Secondary | ICD-10-CM | POA: Diagnosis not present

## 2021-10-29 DIAGNOSIS — E119 Type 2 diabetes mellitus without complications: Secondary | ICD-10-CM | POA: Diagnosis not present

## 2021-10-29 DIAGNOSIS — Z79899 Other long term (current) drug therapy: Secondary | ICD-10-CM | POA: Diagnosis not present

## 2021-10-29 DIAGNOSIS — N189 Chronic kidney disease, unspecified: Secondary | ICD-10-CM | POA: Diagnosis not present

## 2021-10-29 DIAGNOSIS — E559 Vitamin D deficiency, unspecified: Secondary | ICD-10-CM | POA: Diagnosis not present

## 2021-10-29 DIAGNOSIS — I10 Essential (primary) hypertension: Secondary | ICD-10-CM | POA: Diagnosis not present

## 2021-10-29 DIAGNOSIS — M1990 Unspecified osteoarthritis, unspecified site: Secondary | ICD-10-CM | POA: Diagnosis not present

## 2021-10-29 DIAGNOSIS — L84 Corns and callosities: Secondary | ICD-10-CM | POA: Diagnosis not present

## 2021-10-29 DIAGNOSIS — K589 Irritable bowel syndrome without diarrhea: Secondary | ICD-10-CM | POA: Diagnosis not present

## 2021-11-04 DIAGNOSIS — R0602 Shortness of breath: Secondary | ICD-10-CM | POA: Diagnosis not present

## 2021-11-06 DIAGNOSIS — Z20828 Contact with and (suspected) exposure to other viral communicable diseases: Secondary | ICD-10-CM | POA: Diagnosis not present

## 2021-11-20 DIAGNOSIS — E119 Type 2 diabetes mellitus without complications: Secondary | ICD-10-CM | POA: Diagnosis not present

## 2021-11-20 DIAGNOSIS — E785 Hyperlipidemia, unspecified: Secondary | ICD-10-CM | POA: Diagnosis not present

## 2021-11-20 DIAGNOSIS — N189 Chronic kidney disease, unspecified: Secondary | ICD-10-CM | POA: Diagnosis not present

## 2021-11-20 DIAGNOSIS — I10 Essential (primary) hypertension: Secondary | ICD-10-CM | POA: Diagnosis not present

## 2021-11-20 DIAGNOSIS — E039 Hypothyroidism, unspecified: Secondary | ICD-10-CM | POA: Diagnosis not present

## 2021-11-20 DIAGNOSIS — F0280 Dementia in other diseases classified elsewhere without behavioral disturbance: Secondary | ICD-10-CM | POA: Diagnosis not present

## 2021-11-20 DIAGNOSIS — D518 Other vitamin B12 deficiency anemias: Secondary | ICD-10-CM | POA: Diagnosis not present

## 2021-11-20 DIAGNOSIS — E038 Other specified hypothyroidism: Secondary | ICD-10-CM | POA: Diagnosis not present

## 2021-11-20 DIAGNOSIS — M1990 Unspecified osteoarthritis, unspecified site: Secondary | ICD-10-CM | POA: Diagnosis not present

## 2021-11-20 DIAGNOSIS — N183 Chronic kidney disease, stage 3 unspecified: Secondary | ICD-10-CM | POA: Diagnosis not present

## 2021-11-20 DIAGNOSIS — E559 Vitamin D deficiency, unspecified: Secondary | ICD-10-CM | POA: Diagnosis not present

## 2021-11-24 DIAGNOSIS — I10 Essential (primary) hypertension: Secondary | ICD-10-CM | POA: Diagnosis not present

## 2021-11-26 DIAGNOSIS — E870 Hyperosmolality and hypernatremia: Secondary | ICD-10-CM | POA: Diagnosis not present

## 2021-11-26 DIAGNOSIS — B351 Tinea unguium: Secondary | ICD-10-CM | POA: Diagnosis not present

## 2021-11-26 DIAGNOSIS — R0602 Shortness of breath: Secondary | ICD-10-CM | POA: Diagnosis not present

## 2021-11-26 DIAGNOSIS — L84 Corns and callosities: Secondary | ICD-10-CM | POA: Diagnosis not present

## 2021-11-26 DIAGNOSIS — R600 Localized edema: Secondary | ICD-10-CM | POA: Diagnosis not present

## 2021-11-26 DIAGNOSIS — E785 Hyperlipidemia, unspecified: Secondary | ICD-10-CM | POA: Diagnosis not present

## 2021-11-26 DIAGNOSIS — I10 Essential (primary) hypertension: Secondary | ICD-10-CM | POA: Diagnosis not present

## 2021-11-26 DIAGNOSIS — K219 Gastro-esophageal reflux disease without esophagitis: Secondary | ICD-10-CM | POA: Diagnosis not present

## 2021-11-26 DIAGNOSIS — N189 Chronic kidney disease, unspecified: Secondary | ICD-10-CM | POA: Diagnosis not present

## 2021-11-29 DIAGNOSIS — E119 Type 2 diabetes mellitus without complications: Secondary | ICD-10-CM | POA: Diagnosis not present

## 2021-11-29 DIAGNOSIS — E7849 Other hyperlipidemia: Secondary | ICD-10-CM | POA: Diagnosis not present

## 2021-11-29 DIAGNOSIS — Z79899 Other long term (current) drug therapy: Secondary | ICD-10-CM | POA: Diagnosis not present

## 2021-11-29 DIAGNOSIS — D518 Other vitamin B12 deficiency anemias: Secondary | ICD-10-CM | POA: Diagnosis not present

## 2021-12-01 DIAGNOSIS — F0390 Unspecified dementia without behavioral disturbance: Secondary | ICD-10-CM | POA: Diagnosis not present

## 2021-12-01 DIAGNOSIS — N3000 Acute cystitis without hematuria: Secondary | ICD-10-CM | POA: Diagnosis not present

## 2021-12-18 DIAGNOSIS — E785 Hyperlipidemia, unspecified: Secondary | ICD-10-CM | POA: Diagnosis not present

## 2021-12-18 DIAGNOSIS — I10 Essential (primary) hypertension: Secondary | ICD-10-CM | POA: Diagnosis not present

## 2021-12-18 DIAGNOSIS — F0280 Dementia in other diseases classified elsewhere without behavioral disturbance: Secondary | ICD-10-CM | POA: Diagnosis not present

## 2021-12-18 DIAGNOSIS — M1990 Unspecified osteoarthritis, unspecified site: Secondary | ICD-10-CM | POA: Diagnosis not present

## 2021-12-18 DIAGNOSIS — E559 Vitamin D deficiency, unspecified: Secondary | ICD-10-CM | POA: Diagnosis not present

## 2021-12-18 DIAGNOSIS — N189 Chronic kidney disease, unspecified: Secondary | ICD-10-CM | POA: Diagnosis not present

## 2021-12-18 DIAGNOSIS — D518 Other vitamin B12 deficiency anemias: Secondary | ICD-10-CM | POA: Diagnosis not present

## 2021-12-18 DIAGNOSIS — N183 Chronic kidney disease, stage 3 unspecified: Secondary | ICD-10-CM | POA: Diagnosis not present

## 2021-12-18 DIAGNOSIS — E039 Hypothyroidism, unspecified: Secondary | ICD-10-CM | POA: Diagnosis not present

## 2021-12-18 DIAGNOSIS — E038 Other specified hypothyroidism: Secondary | ICD-10-CM | POA: Diagnosis not present

## 2021-12-18 DIAGNOSIS — E119 Type 2 diabetes mellitus without complications: Secondary | ICD-10-CM | POA: Diagnosis not present

## 2021-12-21 DIAGNOSIS — I10 Essential (primary) hypertension: Secondary | ICD-10-CM | POA: Diagnosis not present

## 2021-12-31 DIAGNOSIS — K219 Gastro-esophageal reflux disease without esophagitis: Secondary | ICD-10-CM | POA: Diagnosis not present

## 2021-12-31 DIAGNOSIS — L84 Corns and callosities: Secondary | ICD-10-CM | POA: Diagnosis not present

## 2021-12-31 DIAGNOSIS — N189 Chronic kidney disease, unspecified: Secondary | ICD-10-CM | POA: Diagnosis not present

## 2021-12-31 DIAGNOSIS — G309 Alzheimer's disease, unspecified: Secondary | ICD-10-CM | POA: Diagnosis not present

## 2021-12-31 DIAGNOSIS — E785 Hyperlipidemia, unspecified: Secondary | ICD-10-CM | POA: Diagnosis not present

## 2021-12-31 DIAGNOSIS — F0280 Dementia in other diseases classified elsewhere without behavioral disturbance: Secondary | ICD-10-CM | POA: Diagnosis not present

## 2021-12-31 DIAGNOSIS — R059 Cough, unspecified: Secondary | ICD-10-CM | POA: Diagnosis not present

## 2021-12-31 DIAGNOSIS — J984 Other disorders of lung: Secondary | ICD-10-CM | POA: Diagnosis not present

## 2021-12-31 DIAGNOSIS — K1379 Other lesions of oral mucosa: Secondary | ICD-10-CM | POA: Diagnosis not present

## 2021-12-31 DIAGNOSIS — K649 Unspecified hemorrhoids: Secondary | ICD-10-CM | POA: Diagnosis not present

## 2021-12-31 DIAGNOSIS — B351 Tinea unguium: Secondary | ICD-10-CM | POA: Diagnosis not present

## 2021-12-31 DIAGNOSIS — I10 Essential (primary) hypertension: Secondary | ICD-10-CM | POA: Diagnosis not present

## 2022-01-13 DIAGNOSIS — E785 Hyperlipidemia, unspecified: Secondary | ICD-10-CM | POA: Diagnosis not present

## 2022-01-13 DIAGNOSIS — E038 Other specified hypothyroidism: Secondary | ICD-10-CM | POA: Diagnosis not present

## 2022-01-13 DIAGNOSIS — E039 Hypothyroidism, unspecified: Secondary | ICD-10-CM | POA: Diagnosis not present

## 2022-01-13 DIAGNOSIS — E559 Vitamin D deficiency, unspecified: Secondary | ICD-10-CM | POA: Diagnosis not present

## 2022-01-13 DIAGNOSIS — D518 Other vitamin B12 deficiency anemias: Secondary | ICD-10-CM | POA: Diagnosis not present

## 2022-01-13 DIAGNOSIS — N183 Chronic kidney disease, stage 3 unspecified: Secondary | ICD-10-CM | POA: Diagnosis not present

## 2022-01-13 DIAGNOSIS — E119 Type 2 diabetes mellitus without complications: Secondary | ICD-10-CM | POA: Diagnosis not present

## 2022-01-13 DIAGNOSIS — N189 Chronic kidney disease, unspecified: Secondary | ICD-10-CM | POA: Diagnosis not present

## 2022-01-13 DIAGNOSIS — F0280 Dementia in other diseases classified elsewhere without behavioral disturbance: Secondary | ICD-10-CM | POA: Diagnosis not present

## 2022-01-13 DIAGNOSIS — M1990 Unspecified osteoarthritis, unspecified site: Secondary | ICD-10-CM | POA: Diagnosis not present

## 2022-01-13 DIAGNOSIS — I10 Essential (primary) hypertension: Secondary | ICD-10-CM | POA: Diagnosis not present

## 2022-01-13 DIAGNOSIS — G309 Alzheimer's disease, unspecified: Secondary | ICD-10-CM | POA: Diagnosis not present

## 2022-01-19 DIAGNOSIS — I10 Essential (primary) hypertension: Secondary | ICD-10-CM | POA: Diagnosis not present

## 2022-01-21 DIAGNOSIS — R221 Localized swelling, mass and lump, neck: Secondary | ICD-10-CM | POA: Diagnosis not present

## 2022-01-21 DIAGNOSIS — E042 Nontoxic multinodular goiter: Secondary | ICD-10-CM | POA: Diagnosis not present

## 2022-01-24 DIAGNOSIS — L84 Corns and callosities: Secondary | ICD-10-CM | POA: Diagnosis not present

## 2022-01-24 DIAGNOSIS — L600 Ingrowing nail: Secondary | ICD-10-CM | POA: Diagnosis not present

## 2022-01-24 DIAGNOSIS — M1990 Unspecified osteoarthritis, unspecified site: Secondary | ICD-10-CM | POA: Diagnosis not present

## 2022-01-24 DIAGNOSIS — R600 Localized edema: Secondary | ICD-10-CM | POA: Diagnosis not present

## 2022-01-24 DIAGNOSIS — B351 Tinea unguium: Secondary | ICD-10-CM | POA: Diagnosis not present

## 2022-01-24 DIAGNOSIS — M79675 Pain in left toe(s): Secondary | ICD-10-CM | POA: Diagnosis not present

## 2022-01-24 DIAGNOSIS — M79674 Pain in right toe(s): Secondary | ICD-10-CM | POA: Diagnosis not present

## 2022-01-24 DIAGNOSIS — Z87891 Personal history of nicotine dependence: Secondary | ICD-10-CM | POA: Diagnosis not present

## 2022-01-28 DIAGNOSIS — E119 Type 2 diabetes mellitus without complications: Secondary | ICD-10-CM | POA: Diagnosis not present

## 2022-01-28 DIAGNOSIS — R631 Polydipsia: Secondary | ICD-10-CM | POA: Diagnosis not present

## 2022-01-28 DIAGNOSIS — R600 Localized edema: Secondary | ICD-10-CM | POA: Diagnosis not present

## 2022-01-28 DIAGNOSIS — M1990 Unspecified osteoarthritis, unspecified site: Secondary | ICD-10-CM | POA: Diagnosis not present

## 2022-01-28 DIAGNOSIS — E038 Other specified hypothyroidism: Secondary | ICD-10-CM | POA: Diagnosis not present

## 2022-01-28 DIAGNOSIS — N189 Chronic kidney disease, unspecified: Secondary | ICD-10-CM | POA: Diagnosis not present

## 2022-01-28 DIAGNOSIS — D518 Other vitamin B12 deficiency anemias: Secondary | ICD-10-CM | POA: Diagnosis not present

## 2022-01-28 DIAGNOSIS — R059 Cough, unspecified: Secondary | ICD-10-CM | POA: Diagnosis not present

## 2022-01-28 DIAGNOSIS — K219 Gastro-esophageal reflux disease without esophagitis: Secondary | ICD-10-CM | POA: Diagnosis not present

## 2022-01-28 DIAGNOSIS — E559 Vitamin D deficiency, unspecified: Secondary | ICD-10-CM | POA: Diagnosis not present

## 2022-01-28 DIAGNOSIS — G309 Alzheimer's disease, unspecified: Secondary | ICD-10-CM | POA: Diagnosis not present

## 2022-01-28 DIAGNOSIS — Z79899 Other long term (current) drug therapy: Secondary | ICD-10-CM | POA: Diagnosis not present

## 2022-01-28 DIAGNOSIS — R062 Wheezing: Secondary | ICD-10-CM | POA: Diagnosis not present

## 2022-01-28 DIAGNOSIS — E0590 Thyrotoxicosis, unspecified without thyrotoxic crisis or storm: Secondary | ICD-10-CM | POA: Diagnosis not present

## 2022-01-28 DIAGNOSIS — E7849 Other hyperlipidemia: Secondary | ICD-10-CM | POA: Diagnosis not present

## 2022-01-28 DIAGNOSIS — K589 Irritable bowel syndrome without diarrhea: Secondary | ICD-10-CM | POA: Diagnosis not present

## 2022-02-16 DIAGNOSIS — G309 Alzheimer's disease, unspecified: Secondary | ICD-10-CM | POA: Diagnosis not present

## 2022-02-16 DIAGNOSIS — R062 Wheezing: Secondary | ICD-10-CM | POA: Diagnosis not present

## 2022-02-16 DIAGNOSIS — D518 Other vitamin B12 deficiency anemias: Secondary | ICD-10-CM | POA: Diagnosis not present

## 2022-02-16 DIAGNOSIS — E559 Vitamin D deficiency, unspecified: Secondary | ICD-10-CM | POA: Diagnosis not present

## 2022-02-16 DIAGNOSIS — E785 Hyperlipidemia, unspecified: Secondary | ICD-10-CM | POA: Diagnosis not present

## 2022-02-16 DIAGNOSIS — M1990 Unspecified osteoarthritis, unspecified site: Secondary | ICD-10-CM | POA: Diagnosis not present

## 2022-02-16 DIAGNOSIS — E038 Other specified hypothyroidism: Secondary | ICD-10-CM | POA: Diagnosis not present

## 2022-02-16 DIAGNOSIS — F0280 Dementia in other diseases classified elsewhere without behavioral disturbance: Secondary | ICD-10-CM | POA: Diagnosis not present

## 2022-02-16 DIAGNOSIS — N189 Chronic kidney disease, unspecified: Secondary | ICD-10-CM | POA: Diagnosis not present

## 2022-02-16 DIAGNOSIS — E039 Hypothyroidism, unspecified: Secondary | ICD-10-CM | POA: Diagnosis not present

## 2022-02-16 DIAGNOSIS — I10 Essential (primary) hypertension: Secondary | ICD-10-CM | POA: Diagnosis not present

## 2022-02-16 DIAGNOSIS — E119 Type 2 diabetes mellitus without complications: Secondary | ICD-10-CM | POA: Diagnosis not present

## 2022-02-16 DIAGNOSIS — N183 Chronic kidney disease, stage 3 unspecified: Secondary | ICD-10-CM | POA: Diagnosis not present

## 2022-02-19 DIAGNOSIS — I10 Essential (primary) hypertension: Secondary | ICD-10-CM | POA: Diagnosis not present

## 2022-02-23 DIAGNOSIS — Z20822 Contact with and (suspected) exposure to covid-19: Secondary | ICD-10-CM | POA: Diagnosis not present

## 2022-02-25 DIAGNOSIS — B351 Tinea unguium: Secondary | ICD-10-CM | POA: Diagnosis not present

## 2022-02-25 DIAGNOSIS — R631 Polydipsia: Secondary | ICD-10-CM | POA: Diagnosis not present

## 2022-02-25 DIAGNOSIS — K219 Gastro-esophageal reflux disease without esophagitis: Secondary | ICD-10-CM | POA: Diagnosis not present

## 2022-02-25 DIAGNOSIS — R059 Cough, unspecified: Secondary | ICD-10-CM | POA: Diagnosis not present

## 2022-02-25 DIAGNOSIS — L84 Corns and callosities: Secondary | ICD-10-CM | POA: Diagnosis not present

## 2022-02-25 DIAGNOSIS — K1379 Other lesions of oral mucosa: Secondary | ICD-10-CM | POA: Diagnosis not present

## 2022-02-25 DIAGNOSIS — D649 Anemia, unspecified: Secondary | ICD-10-CM | POA: Diagnosis not present

## 2022-02-25 DIAGNOSIS — R140 Abdominal distension (gaseous): Secondary | ICD-10-CM | POA: Diagnosis not present

## 2022-02-25 DIAGNOSIS — R600 Localized edema: Secondary | ICD-10-CM | POA: Diagnosis not present

## 2022-02-25 DIAGNOSIS — M1990 Unspecified osteoarthritis, unspecified site: Secondary | ICD-10-CM | POA: Diagnosis not present

## 2022-02-25 DIAGNOSIS — E559 Vitamin D deficiency, unspecified: Secondary | ICD-10-CM | POA: Diagnosis not present

## 2022-02-25 DIAGNOSIS — N189 Chronic kidney disease, unspecified: Secondary | ICD-10-CM | POA: Diagnosis not present

## 2022-03-17 DIAGNOSIS — E039 Hypothyroidism, unspecified: Secondary | ICD-10-CM | POA: Diagnosis not present

## 2022-03-17 DIAGNOSIS — N189 Chronic kidney disease, unspecified: Secondary | ICD-10-CM | POA: Diagnosis not present

## 2022-03-17 DIAGNOSIS — E038 Other specified hypothyroidism: Secondary | ICD-10-CM | POA: Diagnosis not present

## 2022-03-17 DIAGNOSIS — D518 Other vitamin B12 deficiency anemias: Secondary | ICD-10-CM | POA: Diagnosis not present

## 2022-03-17 DIAGNOSIS — N183 Chronic kidney disease, stage 3 unspecified: Secondary | ICD-10-CM | POA: Diagnosis not present

## 2022-03-17 DIAGNOSIS — F0280 Dementia in other diseases classified elsewhere without behavioral disturbance: Secondary | ICD-10-CM | POA: Diagnosis not present

## 2022-03-17 DIAGNOSIS — E785 Hyperlipidemia, unspecified: Secondary | ICD-10-CM | POA: Diagnosis not present

## 2022-03-17 DIAGNOSIS — I10 Essential (primary) hypertension: Secondary | ICD-10-CM | POA: Diagnosis not present

## 2022-03-17 DIAGNOSIS — E559 Vitamin D deficiency, unspecified: Secondary | ICD-10-CM | POA: Diagnosis not present

## 2022-03-17 DIAGNOSIS — E119 Type 2 diabetes mellitus without complications: Secondary | ICD-10-CM | POA: Diagnosis not present

## 2022-03-17 DIAGNOSIS — G309 Alzheimer's disease, unspecified: Secondary | ICD-10-CM | POA: Diagnosis not present

## 2022-03-17 DIAGNOSIS — M1990 Unspecified osteoarthritis, unspecified site: Secondary | ICD-10-CM | POA: Diagnosis not present

## 2022-03-19 DIAGNOSIS — I10 Essential (primary) hypertension: Secondary | ICD-10-CM | POA: Diagnosis not present

## 2022-03-21 DIAGNOSIS — B351 Tinea unguium: Secondary | ICD-10-CM | POA: Diagnosis not present

## 2022-03-21 DIAGNOSIS — M79675 Pain in left toe(s): Secondary | ICD-10-CM | POA: Diagnosis not present

## 2022-03-21 DIAGNOSIS — M79674 Pain in right toe(s): Secondary | ICD-10-CM | POA: Diagnosis not present

## 2022-03-21 DIAGNOSIS — L600 Ingrowing nail: Secondary | ICD-10-CM | POA: Diagnosis not present

## 2022-03-21 DIAGNOSIS — L84 Corns and callosities: Secondary | ICD-10-CM | POA: Diagnosis not present

## 2022-03-21 DIAGNOSIS — R600 Localized edema: Secondary | ICD-10-CM | POA: Diagnosis not present

## 2022-04-08 DIAGNOSIS — I10 Essential (primary) hypertension: Secondary | ICD-10-CM | POA: Diagnosis not present

## 2022-04-08 DIAGNOSIS — E559 Vitamin D deficiency, unspecified: Secondary | ICD-10-CM | POA: Diagnosis not present

## 2022-04-08 DIAGNOSIS — N189 Chronic kidney disease, unspecified: Secondary | ICD-10-CM | POA: Diagnosis not present

## 2022-04-08 DIAGNOSIS — K219 Gastro-esophageal reflux disease without esophagitis: Secondary | ICD-10-CM | POA: Diagnosis not present

## 2022-04-08 DIAGNOSIS — J984 Other disorders of lung: Secondary | ICD-10-CM | POA: Diagnosis not present

## 2022-04-08 DIAGNOSIS — R600 Localized edema: Secondary | ICD-10-CM | POA: Diagnosis not present

## 2022-04-08 DIAGNOSIS — D649 Anemia, unspecified: Secondary | ICD-10-CM | POA: Diagnosis not present

## 2022-04-08 DIAGNOSIS — M1990 Unspecified osteoarthritis, unspecified site: Secondary | ICD-10-CM | POA: Diagnosis not present

## 2022-04-08 DIAGNOSIS — E785 Hyperlipidemia, unspecified: Secondary | ICD-10-CM | POA: Diagnosis not present

## 2022-04-08 DIAGNOSIS — F0280 Dementia in other diseases classified elsewhere without behavioral disturbance: Secondary | ICD-10-CM | POA: Diagnosis not present

## 2022-04-08 DIAGNOSIS — G309 Alzheimer's disease, unspecified: Secondary | ICD-10-CM | POA: Diagnosis not present

## 2022-04-08 DIAGNOSIS — K589 Irritable bowel syndrome without diarrhea: Secondary | ICD-10-CM | POA: Diagnosis not present

## 2022-04-09 DIAGNOSIS — R451 Restlessness and agitation: Secondary | ICD-10-CM | POA: Diagnosis not present

## 2022-04-12 DIAGNOSIS — Z20822 Contact with and (suspected) exposure to covid-19: Secondary | ICD-10-CM | POA: Diagnosis not present

## 2022-04-14 DIAGNOSIS — Z20822 Contact with and (suspected) exposure to covid-19: Secondary | ICD-10-CM | POA: Diagnosis not present

## 2022-04-14 DIAGNOSIS — E119 Type 2 diabetes mellitus without complications: Secondary | ICD-10-CM | POA: Diagnosis not present

## 2022-04-14 DIAGNOSIS — I10 Essential (primary) hypertension: Secondary | ICD-10-CM | POA: Diagnosis not present

## 2022-04-14 DIAGNOSIS — D518 Other vitamin B12 deficiency anemias: Secondary | ICD-10-CM | POA: Diagnosis not present

## 2022-04-14 DIAGNOSIS — E039 Hypothyroidism, unspecified: Secondary | ICD-10-CM | POA: Diagnosis not present

## 2022-04-14 DIAGNOSIS — E038 Other specified hypothyroidism: Secondary | ICD-10-CM | POA: Diagnosis not present

## 2022-04-14 DIAGNOSIS — N183 Chronic kidney disease, stage 3 unspecified: Secondary | ICD-10-CM | POA: Diagnosis not present

## 2022-04-14 DIAGNOSIS — M1990 Unspecified osteoarthritis, unspecified site: Secondary | ICD-10-CM | POA: Diagnosis not present

## 2022-04-14 DIAGNOSIS — E785 Hyperlipidemia, unspecified: Secondary | ICD-10-CM | POA: Diagnosis not present

## 2022-04-14 DIAGNOSIS — G309 Alzheimer's disease, unspecified: Secondary | ICD-10-CM | POA: Diagnosis not present

## 2022-04-14 DIAGNOSIS — F0280 Dementia in other diseases classified elsewhere without behavioral disturbance: Secondary | ICD-10-CM | POA: Diagnosis not present

## 2022-04-14 DIAGNOSIS — E559 Vitamin D deficiency, unspecified: Secondary | ICD-10-CM | POA: Diagnosis not present

## 2022-04-14 DIAGNOSIS — N189 Chronic kidney disease, unspecified: Secondary | ICD-10-CM | POA: Diagnosis not present

## 2022-04-19 DIAGNOSIS — I10 Essential (primary) hypertension: Secondary | ICD-10-CM | POA: Diagnosis not present

## 2022-04-29 DIAGNOSIS — F0280 Dementia in other diseases classified elsewhere without behavioral disturbance: Secondary | ICD-10-CM | POA: Diagnosis not present

## 2022-04-29 DIAGNOSIS — I10 Essential (primary) hypertension: Secondary | ICD-10-CM | POA: Diagnosis not present

## 2022-04-29 DIAGNOSIS — G309 Alzheimer's disease, unspecified: Secondary | ICD-10-CM | POA: Diagnosis not present

## 2022-04-29 DIAGNOSIS — E785 Hyperlipidemia, unspecified: Secondary | ICD-10-CM | POA: Diagnosis not present

## 2022-04-29 DIAGNOSIS — R600 Localized edema: Secondary | ICD-10-CM | POA: Diagnosis not present

## 2022-04-29 DIAGNOSIS — N189 Chronic kidney disease, unspecified: Secondary | ICD-10-CM | POA: Diagnosis not present

## 2022-04-29 DIAGNOSIS — J984 Other disorders of lung: Secondary | ICD-10-CM | POA: Diagnosis not present

## 2022-04-29 DIAGNOSIS — F05 Delirium due to known physiological condition: Secondary | ICD-10-CM | POA: Diagnosis not present

## 2022-05-03 DIAGNOSIS — Z20822 Contact with and (suspected) exposure to covid-19: Secondary | ICD-10-CM | POA: Diagnosis not present

## 2022-05-16 DIAGNOSIS — E119 Type 2 diabetes mellitus without complications: Secondary | ICD-10-CM | POA: Diagnosis not present

## 2022-05-16 DIAGNOSIS — E7849 Other hyperlipidemia: Secondary | ICD-10-CM | POA: Diagnosis not present

## 2022-05-16 DIAGNOSIS — Z79899 Other long term (current) drug therapy: Secondary | ICD-10-CM | POA: Diagnosis not present

## 2022-05-16 DIAGNOSIS — D518 Other vitamin B12 deficiency anemias: Secondary | ICD-10-CM | POA: Diagnosis not present

## 2022-05-16 DIAGNOSIS — E559 Vitamin D deficiency, unspecified: Secondary | ICD-10-CM | POA: Diagnosis not present

## 2022-05-16 DIAGNOSIS — E038 Other specified hypothyroidism: Secondary | ICD-10-CM | POA: Diagnosis not present

## 2022-05-19 DIAGNOSIS — E039 Hypothyroidism, unspecified: Secondary | ICD-10-CM | POA: Diagnosis not present

## 2022-05-19 DIAGNOSIS — E038 Other specified hypothyroidism: Secondary | ICD-10-CM | POA: Diagnosis not present

## 2022-05-19 DIAGNOSIS — D518 Other vitamin B12 deficiency anemias: Secondary | ICD-10-CM | POA: Diagnosis not present

## 2022-05-19 DIAGNOSIS — M1990 Unspecified osteoarthritis, unspecified site: Secondary | ICD-10-CM | POA: Diagnosis not present

## 2022-05-19 DIAGNOSIS — E785 Hyperlipidemia, unspecified: Secondary | ICD-10-CM | POA: Diagnosis not present

## 2022-05-19 DIAGNOSIS — G309 Alzheimer's disease, unspecified: Secondary | ICD-10-CM | POA: Diagnosis not present

## 2022-05-19 DIAGNOSIS — E119 Type 2 diabetes mellitus without complications: Secondary | ICD-10-CM | POA: Diagnosis not present

## 2022-05-19 DIAGNOSIS — N189 Chronic kidney disease, unspecified: Secondary | ICD-10-CM | POA: Diagnosis not present

## 2022-05-19 DIAGNOSIS — F0280 Dementia in other diseases classified elsewhere without behavioral disturbance: Secondary | ICD-10-CM | POA: Diagnosis not present

## 2022-05-19 DIAGNOSIS — N183 Chronic kidney disease, stage 3 unspecified: Secondary | ICD-10-CM | POA: Diagnosis not present

## 2022-05-19 DIAGNOSIS — E559 Vitamin D deficiency, unspecified: Secondary | ICD-10-CM | POA: Diagnosis not present

## 2022-05-19 DIAGNOSIS — I10 Essential (primary) hypertension: Secondary | ICD-10-CM | POA: Diagnosis not present

## 2022-05-20 DIAGNOSIS — I10 Essential (primary) hypertension: Secondary | ICD-10-CM | POA: Diagnosis not present

## 2022-05-27 DIAGNOSIS — D649 Anemia, unspecified: Secondary | ICD-10-CM | POA: Diagnosis not present

## 2022-05-27 DIAGNOSIS — R748 Abnormal levels of other serum enzymes: Secondary | ICD-10-CM | POA: Diagnosis not present

## 2022-05-27 DIAGNOSIS — E559 Vitamin D deficiency, unspecified: Secondary | ICD-10-CM | POA: Diagnosis not present

## 2022-05-27 DIAGNOSIS — N189 Chronic kidney disease, unspecified: Secondary | ICD-10-CM | POA: Diagnosis not present

## 2022-05-27 DIAGNOSIS — J984 Other disorders of lung: Secondary | ICD-10-CM | POA: Diagnosis not present

## 2022-05-27 DIAGNOSIS — I10 Essential (primary) hypertension: Secondary | ICD-10-CM | POA: Diagnosis not present

## 2022-05-27 DIAGNOSIS — E041 Nontoxic single thyroid nodule: Secondary | ICD-10-CM | POA: Diagnosis not present

## 2022-05-27 DIAGNOSIS — E039 Hypothyroidism, unspecified: Secondary | ICD-10-CM | POA: Diagnosis not present

## 2022-05-27 DIAGNOSIS — E785 Hyperlipidemia, unspecified: Secondary | ICD-10-CM | POA: Diagnosis not present

## 2022-05-27 DIAGNOSIS — G309 Alzheimer's disease, unspecified: Secondary | ICD-10-CM | POA: Diagnosis not present

## 2022-05-27 DIAGNOSIS — K219 Gastro-esophageal reflux disease without esophagitis: Secondary | ICD-10-CM | POA: Diagnosis not present

## 2022-05-28 DIAGNOSIS — R062 Wheezing: Secondary | ICD-10-CM | POA: Diagnosis not present

## 2022-06-01 DIAGNOSIS — Z79899 Other long term (current) drug therapy: Secondary | ICD-10-CM | POA: Diagnosis not present

## 2022-06-01 DIAGNOSIS — D518 Other vitamin B12 deficiency anemias: Secondary | ICD-10-CM | POA: Diagnosis not present

## 2022-06-01 DIAGNOSIS — E7849 Other hyperlipidemia: Secondary | ICD-10-CM | POA: Diagnosis not present

## 2022-06-01 DIAGNOSIS — E119 Type 2 diabetes mellitus without complications: Secondary | ICD-10-CM | POA: Diagnosis not present

## 2022-06-20 DIAGNOSIS — I10 Essential (primary) hypertension: Secondary | ICD-10-CM | POA: Diagnosis not present

## 2022-06-22 DIAGNOSIS — M2012 Hallux valgus (acquired), left foot: Secondary | ICD-10-CM | POA: Diagnosis not present

## 2022-06-22 DIAGNOSIS — M79671 Pain in right foot: Secondary | ICD-10-CM | POA: Diagnosis not present

## 2022-06-22 DIAGNOSIS — G309 Alzheimer's disease, unspecified: Secondary | ICD-10-CM | POA: Diagnosis not present

## 2022-06-22 DIAGNOSIS — L600 Ingrowing nail: Secondary | ICD-10-CM | POA: Diagnosis not present

## 2022-06-22 DIAGNOSIS — I10 Essential (primary) hypertension: Secondary | ICD-10-CM | POA: Diagnosis not present

## 2022-06-22 DIAGNOSIS — F0280 Dementia in other diseases classified elsewhere without behavioral disturbance: Secondary | ICD-10-CM | POA: Diagnosis not present

## 2022-06-22 DIAGNOSIS — E119 Type 2 diabetes mellitus without complications: Secondary | ICD-10-CM | POA: Diagnosis not present

## 2022-06-22 DIAGNOSIS — D518 Other vitamin B12 deficiency anemias: Secondary | ICD-10-CM | POA: Diagnosis not present

## 2022-06-22 DIAGNOSIS — M2042 Other hammer toe(s) (acquired), left foot: Secondary | ICD-10-CM | POA: Diagnosis not present

## 2022-06-22 DIAGNOSIS — M1990 Unspecified osteoarthritis, unspecified site: Secondary | ICD-10-CM | POA: Diagnosis not present

## 2022-06-22 DIAGNOSIS — L84 Corns and callosities: Secondary | ICD-10-CM | POA: Diagnosis not present

## 2022-06-22 DIAGNOSIS — E038 Other specified hypothyroidism: Secondary | ICD-10-CM | POA: Diagnosis not present

## 2022-06-22 DIAGNOSIS — E785 Hyperlipidemia, unspecified: Secondary | ICD-10-CM | POA: Diagnosis not present

## 2022-06-22 DIAGNOSIS — M2011 Hallux valgus (acquired), right foot: Secondary | ICD-10-CM | POA: Diagnosis not present

## 2022-06-22 DIAGNOSIS — N183 Chronic kidney disease, stage 3 unspecified: Secondary | ICD-10-CM | POA: Diagnosis not present

## 2022-06-22 DIAGNOSIS — N189 Chronic kidney disease, unspecified: Secondary | ICD-10-CM | POA: Diagnosis not present

## 2022-06-22 DIAGNOSIS — M79672 Pain in left foot: Secondary | ICD-10-CM | POA: Diagnosis not present

## 2022-06-22 DIAGNOSIS — E039 Hypothyroidism, unspecified: Secondary | ICD-10-CM | POA: Diagnosis not present

## 2022-06-22 DIAGNOSIS — M2041 Other hammer toe(s) (acquired), right foot: Secondary | ICD-10-CM | POA: Diagnosis not present

## 2022-06-22 DIAGNOSIS — E559 Vitamin D deficiency, unspecified: Secondary | ICD-10-CM | POA: Diagnosis not present

## 2022-06-22 DIAGNOSIS — B351 Tinea unguium: Secondary | ICD-10-CM | POA: Diagnosis not present

## 2022-06-24 DIAGNOSIS — E785 Hyperlipidemia, unspecified: Secondary | ICD-10-CM | POA: Diagnosis not present

## 2022-06-24 DIAGNOSIS — I10 Essential (primary) hypertension: Secondary | ICD-10-CM | POA: Diagnosis not present

## 2022-06-24 DIAGNOSIS — M1990 Unspecified osteoarthritis, unspecified site: Secondary | ICD-10-CM | POA: Diagnosis not present

## 2022-06-24 DIAGNOSIS — K589 Irritable bowel syndrome without diarrhea: Secondary | ICD-10-CM | POA: Diagnosis not present

## 2022-06-24 DIAGNOSIS — N189 Chronic kidney disease, unspecified: Secondary | ICD-10-CM | POA: Diagnosis not present

## 2022-06-24 DIAGNOSIS — R748 Abnormal levels of other serum enzymes: Secondary | ICD-10-CM | POA: Diagnosis not present

## 2022-06-24 DIAGNOSIS — F32A Depression, unspecified: Secondary | ICD-10-CM | POA: Diagnosis not present

## 2022-06-24 DIAGNOSIS — E039 Hypothyroidism, unspecified: Secondary | ICD-10-CM | POA: Diagnosis not present

## 2022-06-24 DIAGNOSIS — F419 Anxiety disorder, unspecified: Secondary | ICD-10-CM | POA: Diagnosis not present

## 2022-06-24 DIAGNOSIS — E538 Deficiency of other specified B group vitamins: Secondary | ICD-10-CM | POA: Diagnosis not present

## 2022-06-24 DIAGNOSIS — K219 Gastro-esophageal reflux disease without esophagitis: Secondary | ICD-10-CM | POA: Diagnosis not present

## 2022-06-24 DIAGNOSIS — J984 Other disorders of lung: Secondary | ICD-10-CM | POA: Diagnosis not present

## 2022-06-30 DIAGNOSIS — Z79899 Other long term (current) drug therapy: Secondary | ICD-10-CM | POA: Diagnosis not present

## 2022-06-30 DIAGNOSIS — E038 Other specified hypothyroidism: Secondary | ICD-10-CM | POA: Diagnosis not present

## 2022-06-30 DIAGNOSIS — E119 Type 2 diabetes mellitus without complications: Secondary | ICD-10-CM | POA: Diagnosis not present

## 2022-06-30 DIAGNOSIS — E782 Mixed hyperlipidemia: Secondary | ICD-10-CM | POA: Diagnosis not present

## 2022-06-30 DIAGNOSIS — D518 Other vitamin B12 deficiency anemias: Secondary | ICD-10-CM | POA: Diagnosis not present

## 2022-07-06 DIAGNOSIS — D518 Other vitamin B12 deficiency anemias: Secondary | ICD-10-CM | POA: Diagnosis not present

## 2022-07-06 DIAGNOSIS — E119 Type 2 diabetes mellitus without complications: Secondary | ICD-10-CM | POA: Diagnosis not present

## 2022-07-06 DIAGNOSIS — Z79899 Other long term (current) drug therapy: Secondary | ICD-10-CM | POA: Diagnosis not present

## 2022-07-06 DIAGNOSIS — E782 Mixed hyperlipidemia: Secondary | ICD-10-CM | POA: Diagnosis not present

## 2022-07-08 DIAGNOSIS — K219 Gastro-esophageal reflux disease without esophagitis: Secondary | ICD-10-CM | POA: Diagnosis not present

## 2022-07-08 DIAGNOSIS — D649 Anemia, unspecified: Secondary | ICD-10-CM | POA: Diagnosis not present

## 2022-07-08 DIAGNOSIS — M1990 Unspecified osteoarthritis, unspecified site: Secondary | ICD-10-CM | POA: Diagnosis not present

## 2022-07-08 DIAGNOSIS — E538 Deficiency of other specified B group vitamins: Secondary | ICD-10-CM | POA: Diagnosis not present

## 2022-07-08 DIAGNOSIS — F05 Delirium due to known physiological condition: Secondary | ICD-10-CM | POA: Diagnosis not present

## 2022-07-08 DIAGNOSIS — F32A Depression, unspecified: Secondary | ICD-10-CM | POA: Diagnosis not present

## 2022-07-08 DIAGNOSIS — R600 Localized edema: Secondary | ICD-10-CM | POA: Diagnosis not present

## 2022-07-08 DIAGNOSIS — G309 Alzheimer's disease, unspecified: Secondary | ICD-10-CM | POA: Diagnosis not present

## 2022-07-08 DIAGNOSIS — J984 Other disorders of lung: Secondary | ICD-10-CM | POA: Diagnosis not present

## 2022-07-08 DIAGNOSIS — N189 Chronic kidney disease, unspecified: Secondary | ICD-10-CM | POA: Diagnosis not present

## 2022-07-08 DIAGNOSIS — E559 Vitamin D deficiency, unspecified: Secondary | ICD-10-CM | POA: Diagnosis not present

## 2022-07-08 DIAGNOSIS — E785 Hyperlipidemia, unspecified: Secondary | ICD-10-CM | POA: Diagnosis not present

## 2022-07-20 DIAGNOSIS — E119 Type 2 diabetes mellitus without complications: Secondary | ICD-10-CM | POA: Diagnosis not present

## 2022-07-20 DIAGNOSIS — D518 Other vitamin B12 deficiency anemias: Secondary | ICD-10-CM | POA: Diagnosis not present

## 2022-07-20 DIAGNOSIS — Z79899 Other long term (current) drug therapy: Secondary | ICD-10-CM | POA: Diagnosis not present

## 2022-07-20 DIAGNOSIS — E782 Mixed hyperlipidemia: Secondary | ICD-10-CM | POA: Diagnosis not present

## 2022-07-21 DIAGNOSIS — N189 Chronic kidney disease, unspecified: Secondary | ICD-10-CM | POA: Diagnosis not present

## 2022-07-21 DIAGNOSIS — M1990 Unspecified osteoarthritis, unspecified site: Secondary | ICD-10-CM | POA: Diagnosis not present

## 2022-07-21 DIAGNOSIS — G309 Alzheimer's disease, unspecified: Secondary | ICD-10-CM | POA: Diagnosis not present

## 2022-07-21 DIAGNOSIS — E038 Other specified hypothyroidism: Secondary | ICD-10-CM | POA: Diagnosis not present

## 2022-07-21 DIAGNOSIS — E785 Hyperlipidemia, unspecified: Secondary | ICD-10-CM | POA: Diagnosis not present

## 2022-07-21 DIAGNOSIS — I10 Essential (primary) hypertension: Secondary | ICD-10-CM | POA: Diagnosis not present

## 2022-07-21 DIAGNOSIS — D518 Other vitamin B12 deficiency anemias: Secondary | ICD-10-CM | POA: Diagnosis not present

## 2022-07-21 DIAGNOSIS — E039 Hypothyroidism, unspecified: Secondary | ICD-10-CM | POA: Diagnosis not present

## 2022-07-21 DIAGNOSIS — N183 Chronic kidney disease, stage 3 unspecified: Secondary | ICD-10-CM | POA: Diagnosis not present

## 2022-07-21 DIAGNOSIS — E559 Vitamin D deficiency, unspecified: Secondary | ICD-10-CM | POA: Diagnosis not present

## 2022-07-21 DIAGNOSIS — E119 Type 2 diabetes mellitus without complications: Secondary | ICD-10-CM | POA: Diagnosis not present

## 2022-07-21 DIAGNOSIS — F0280 Dementia in other diseases classified elsewhere without behavioral disturbance: Secondary | ICD-10-CM | POA: Diagnosis not present

## 2022-07-29 DIAGNOSIS — N189 Chronic kidney disease, unspecified: Secondary | ICD-10-CM | POA: Diagnosis not present

## 2022-07-29 DIAGNOSIS — R600 Localized edema: Secondary | ICD-10-CM | POA: Diagnosis not present

## 2022-07-29 DIAGNOSIS — I10 Essential (primary) hypertension: Secondary | ICD-10-CM | POA: Diagnosis not present

## 2022-07-29 DIAGNOSIS — E041 Nontoxic single thyroid nodule: Secondary | ICD-10-CM | POA: Diagnosis not present

## 2022-07-29 DIAGNOSIS — G309 Alzheimer's disease, unspecified: Secondary | ICD-10-CM | POA: Diagnosis not present

## 2022-07-29 DIAGNOSIS — E785 Hyperlipidemia, unspecified: Secondary | ICD-10-CM | POA: Diagnosis not present

## 2022-07-29 DIAGNOSIS — E039 Hypothyroidism, unspecified: Secondary | ICD-10-CM | POA: Diagnosis not present

## 2022-07-29 DIAGNOSIS — E049 Nontoxic goiter, unspecified: Secondary | ICD-10-CM | POA: Diagnosis not present

## 2022-07-29 DIAGNOSIS — J984 Other disorders of lung: Secondary | ICD-10-CM | POA: Diagnosis not present

## 2022-07-29 DIAGNOSIS — K589 Irritable bowel syndrome without diarrhea: Secondary | ICD-10-CM | POA: Diagnosis not present

## 2022-08-04 DIAGNOSIS — J189 Pneumonia, unspecified organism: Secondary | ICD-10-CM | POA: Diagnosis not present

## 2022-08-07 DIAGNOSIS — R4182 Altered mental status, unspecified: Secondary | ICD-10-CM | POA: Diagnosis not present

## 2022-08-08 DIAGNOSIS — N390 Urinary tract infection, site not specified: Secondary | ICD-10-CM | POA: Diagnosis not present

## 2022-08-10 DIAGNOSIS — N390 Urinary tract infection, site not specified: Secondary | ICD-10-CM | POA: Diagnosis not present

## 2022-08-10 DIAGNOSIS — E559 Vitamin D deficiency, unspecified: Secondary | ICD-10-CM | POA: Diagnosis not present

## 2022-08-10 DIAGNOSIS — F32A Depression, unspecified: Secondary | ICD-10-CM | POA: Diagnosis not present

## 2022-08-10 DIAGNOSIS — G309 Alzheimer's disease, unspecified: Secondary | ICD-10-CM | POA: Diagnosis not present

## 2022-08-10 DIAGNOSIS — E785 Hyperlipidemia, unspecified: Secondary | ICD-10-CM | POA: Diagnosis not present

## 2022-08-10 DIAGNOSIS — D649 Anemia, unspecified: Secondary | ICD-10-CM | POA: Diagnosis not present

## 2022-08-10 DIAGNOSIS — M1990 Unspecified osteoarthritis, unspecified site: Secondary | ICD-10-CM | POA: Diagnosis not present

## 2022-08-10 DIAGNOSIS — N189 Chronic kidney disease, unspecified: Secondary | ICD-10-CM | POA: Diagnosis not present

## 2022-08-10 DIAGNOSIS — K219 Gastro-esophageal reflux disease without esophagitis: Secondary | ICD-10-CM | POA: Diagnosis not present

## 2022-08-10 DIAGNOSIS — K589 Irritable bowel syndrome without diarrhea: Secondary | ICD-10-CM | POA: Diagnosis not present

## 2022-08-10 DIAGNOSIS — J984 Other disorders of lung: Secondary | ICD-10-CM | POA: Diagnosis not present

## 2022-08-10 DIAGNOSIS — I10 Essential (primary) hypertension: Secondary | ICD-10-CM | POA: Diagnosis not present

## 2022-08-18 DIAGNOSIS — F0280 Dementia in other diseases classified elsewhere without behavioral disturbance: Secondary | ICD-10-CM | POA: Diagnosis not present

## 2022-08-18 DIAGNOSIS — N183 Chronic kidney disease, stage 3 unspecified: Secondary | ICD-10-CM | POA: Diagnosis not present

## 2022-08-18 DIAGNOSIS — N189 Chronic kidney disease, unspecified: Secondary | ICD-10-CM | POA: Diagnosis not present

## 2022-08-18 DIAGNOSIS — E039 Hypothyroidism, unspecified: Secondary | ICD-10-CM | POA: Diagnosis not present

## 2022-08-18 DIAGNOSIS — E559 Vitamin D deficiency, unspecified: Secondary | ICD-10-CM | POA: Diagnosis not present

## 2022-08-18 DIAGNOSIS — D518 Other vitamin B12 deficiency anemias: Secondary | ICD-10-CM | POA: Diagnosis not present

## 2022-08-18 DIAGNOSIS — E119 Type 2 diabetes mellitus without complications: Secondary | ICD-10-CM | POA: Diagnosis not present

## 2022-08-18 DIAGNOSIS — M1990 Unspecified osteoarthritis, unspecified site: Secondary | ICD-10-CM | POA: Diagnosis not present

## 2022-08-18 DIAGNOSIS — E785 Hyperlipidemia, unspecified: Secondary | ICD-10-CM | POA: Diagnosis not present

## 2022-08-18 DIAGNOSIS — G309 Alzheimer's disease, unspecified: Secondary | ICD-10-CM | POA: Diagnosis not present

## 2022-08-18 DIAGNOSIS — E038 Other specified hypothyroidism: Secondary | ICD-10-CM | POA: Diagnosis not present

## 2022-08-18 DIAGNOSIS — I10 Essential (primary) hypertension: Secondary | ICD-10-CM | POA: Diagnosis not present

## 2022-09-20 DIAGNOSIS — G309 Alzheimer's disease, unspecified: Secondary | ICD-10-CM | POA: Diagnosis not present

## 2022-09-20 DIAGNOSIS — M1990 Unspecified osteoarthritis, unspecified site: Secondary | ICD-10-CM | POA: Diagnosis not present

## 2022-09-20 DIAGNOSIS — E559 Vitamin D deficiency, unspecified: Secondary | ICD-10-CM | POA: Diagnosis not present

## 2022-09-20 DIAGNOSIS — E119 Type 2 diabetes mellitus without complications: Secondary | ICD-10-CM | POA: Diagnosis not present

## 2022-09-20 DIAGNOSIS — E039 Hypothyroidism, unspecified: Secondary | ICD-10-CM | POA: Diagnosis not present

## 2022-09-20 DIAGNOSIS — I10 Essential (primary) hypertension: Secondary | ICD-10-CM | POA: Diagnosis not present

## 2022-09-20 DIAGNOSIS — N183 Chronic kidney disease, stage 3 unspecified: Secondary | ICD-10-CM | POA: Diagnosis not present

## 2022-09-20 DIAGNOSIS — F0280 Dementia in other diseases classified elsewhere without behavioral disturbance: Secondary | ICD-10-CM | POA: Diagnosis not present

## 2022-09-20 DIAGNOSIS — E785 Hyperlipidemia, unspecified: Secondary | ICD-10-CM | POA: Diagnosis not present

## 2022-09-20 DIAGNOSIS — E038 Other specified hypothyroidism: Secondary | ICD-10-CM | POA: Diagnosis not present

## 2022-09-20 DIAGNOSIS — N189 Chronic kidney disease, unspecified: Secondary | ICD-10-CM | POA: Diagnosis not present

## 2022-09-20 DIAGNOSIS — D518 Other vitamin B12 deficiency anemias: Secondary | ICD-10-CM | POA: Diagnosis not present

## 2022-09-23 DIAGNOSIS — M79672 Pain in left foot: Secondary | ICD-10-CM | POA: Diagnosis not present

## 2022-09-23 DIAGNOSIS — J189 Pneumonia, unspecified organism: Secondary | ICD-10-CM | POA: Diagnosis not present

## 2022-09-23 DIAGNOSIS — M79671 Pain in right foot: Secondary | ICD-10-CM | POA: Diagnosis not present

## 2022-09-23 DIAGNOSIS — I10 Essential (primary) hypertension: Secondary | ICD-10-CM | POA: Diagnosis not present

## 2022-09-23 DIAGNOSIS — E039 Hypothyroidism, unspecified: Secondary | ICD-10-CM | POA: Diagnosis not present

## 2022-09-23 DIAGNOSIS — R262 Difficulty in walking, not elsewhere classified: Secondary | ICD-10-CM | POA: Diagnosis not present

## 2022-09-23 DIAGNOSIS — R296 Repeated falls: Secondary | ICD-10-CM | POA: Diagnosis not present

## 2022-09-23 DIAGNOSIS — E049 Nontoxic goiter, unspecified: Secondary | ICD-10-CM | POA: Diagnosis not present

## 2022-09-23 DIAGNOSIS — B351 Tinea unguium: Secondary | ICD-10-CM | POA: Diagnosis not present

## 2022-09-23 DIAGNOSIS — N189 Chronic kidney disease, unspecified: Secondary | ICD-10-CM | POA: Diagnosis not present

## 2022-09-23 DIAGNOSIS — J984 Other disorders of lung: Secondary | ICD-10-CM | POA: Diagnosis not present

## 2022-09-23 DIAGNOSIS — G309 Alzheimer's disease, unspecified: Secondary | ICD-10-CM | POA: Diagnosis not present

## 2022-09-23 DIAGNOSIS — R600 Localized edema: Secondary | ICD-10-CM | POA: Diagnosis not present

## 2022-09-23 DIAGNOSIS — L600 Ingrowing nail: Secondary | ICD-10-CM | POA: Diagnosis not present

## 2022-09-23 DIAGNOSIS — K589 Irritable bowel syndrome without diarrhea: Secondary | ICD-10-CM | POA: Diagnosis not present

## 2022-09-26 DIAGNOSIS — J302 Other seasonal allergic rhinitis: Secondary | ICD-10-CM | POA: Diagnosis not present

## 2022-09-26 DIAGNOSIS — N189 Chronic kidney disease, unspecified: Secondary | ICD-10-CM | POA: Diagnosis not present

## 2022-09-26 DIAGNOSIS — K589 Irritable bowel syndrome without diarrhea: Secondary | ICD-10-CM | POA: Diagnosis not present

## 2022-09-26 DIAGNOSIS — E559 Vitamin D deficiency, unspecified: Secondary | ICD-10-CM | POA: Diagnosis not present

## 2022-09-26 DIAGNOSIS — Z7951 Long term (current) use of inhaled steroids: Secondary | ICD-10-CM | POA: Diagnosis not present

## 2022-09-26 DIAGNOSIS — M810 Age-related osteoporosis without current pathological fracture: Secondary | ICD-10-CM | POA: Diagnosis not present

## 2022-09-26 DIAGNOSIS — D631 Anemia in chronic kidney disease: Secondary | ICD-10-CM | POA: Diagnosis not present

## 2022-09-26 DIAGNOSIS — G309 Alzheimer's disease, unspecified: Secondary | ICD-10-CM | POA: Diagnosis not present

## 2022-09-26 DIAGNOSIS — J984 Other disorders of lung: Secondary | ICD-10-CM | POA: Diagnosis not present

## 2022-09-26 DIAGNOSIS — E538 Deficiency of other specified B group vitamins: Secondary | ICD-10-CM | POA: Diagnosis not present

## 2022-09-26 DIAGNOSIS — F02818 Dementia in other diseases classified elsewhere, unspecified severity, with other behavioral disturbance: Secondary | ICD-10-CM | POA: Diagnosis not present

## 2022-09-26 DIAGNOSIS — F0284 Dementia in other diseases classified elsewhere, unspecified severity, with anxiety: Secondary | ICD-10-CM | POA: Diagnosis not present

## 2022-09-26 DIAGNOSIS — F05 Delirium due to known physiological condition: Secondary | ICD-10-CM | POA: Diagnosis not present

## 2022-09-26 DIAGNOSIS — K219 Gastro-esophageal reflux disease without esophagitis: Secondary | ICD-10-CM | POA: Diagnosis not present

## 2022-09-26 DIAGNOSIS — J189 Pneumonia, unspecified organism: Secondary | ICD-10-CM | POA: Diagnosis not present

## 2022-09-26 DIAGNOSIS — R269 Unspecified abnormalities of gait and mobility: Secondary | ICD-10-CM | POA: Diagnosis not present

## 2022-09-26 DIAGNOSIS — E039 Hypothyroidism, unspecified: Secondary | ICD-10-CM | POA: Diagnosis not present

## 2022-09-26 DIAGNOSIS — I440 Atrioventricular block, first degree: Secondary | ICD-10-CM | POA: Diagnosis not present

## 2022-09-26 DIAGNOSIS — E042 Nontoxic multinodular goiter: Secondary | ICD-10-CM | POA: Diagnosis not present

## 2022-09-26 DIAGNOSIS — F32A Depression, unspecified: Secondary | ICD-10-CM | POA: Diagnosis not present

## 2022-09-26 DIAGNOSIS — I129 Hypertensive chronic kidney disease with stage 1 through stage 4 chronic kidney disease, or unspecified chronic kidney disease: Secondary | ICD-10-CM | POA: Diagnosis not present

## 2022-09-26 DIAGNOSIS — E785 Hyperlipidemia, unspecified: Secondary | ICD-10-CM | POA: Diagnosis not present

## 2022-09-26 DIAGNOSIS — M519 Unspecified thoracic, thoracolumbar and lumbosacral intervertebral disc disorder: Secondary | ICD-10-CM | POA: Diagnosis not present

## 2022-09-26 DIAGNOSIS — M1990 Unspecified osteoarthritis, unspecified site: Secondary | ICD-10-CM | POA: Diagnosis not present

## 2022-09-26 DIAGNOSIS — F0283 Dementia in other diseases classified elsewhere, unspecified severity, with mood disturbance: Secondary | ICD-10-CM | POA: Diagnosis not present

## 2022-10-04 DIAGNOSIS — F0283 Dementia in other diseases classified elsewhere, unspecified severity, with mood disturbance: Secondary | ICD-10-CM | POA: Diagnosis not present

## 2022-10-04 DIAGNOSIS — F0284 Dementia in other diseases classified elsewhere, unspecified severity, with anxiety: Secondary | ICD-10-CM | POA: Diagnosis not present

## 2022-10-04 DIAGNOSIS — F02818 Dementia in other diseases classified elsewhere, unspecified severity, with other behavioral disturbance: Secondary | ICD-10-CM | POA: Diagnosis not present

## 2022-10-04 DIAGNOSIS — F32A Depression, unspecified: Secondary | ICD-10-CM | POA: Diagnosis not present

## 2022-10-04 DIAGNOSIS — G309 Alzheimer's disease, unspecified: Secondary | ICD-10-CM | POA: Diagnosis not present

## 2022-10-04 DIAGNOSIS — J189 Pneumonia, unspecified organism: Secondary | ICD-10-CM | POA: Diagnosis not present

## 2022-10-05 DIAGNOSIS — Z23 Encounter for immunization: Secondary | ICD-10-CM | POA: Diagnosis not present

## 2022-10-11 DIAGNOSIS — F32A Depression, unspecified: Secondary | ICD-10-CM | POA: Diagnosis not present

## 2022-10-11 DIAGNOSIS — G309 Alzheimer's disease, unspecified: Secondary | ICD-10-CM | POA: Diagnosis not present

## 2022-10-11 DIAGNOSIS — F0284 Dementia in other diseases classified elsewhere, unspecified severity, with anxiety: Secondary | ICD-10-CM | POA: Diagnosis not present

## 2022-10-11 DIAGNOSIS — J189 Pneumonia, unspecified organism: Secondary | ICD-10-CM | POA: Diagnosis not present

## 2022-10-11 DIAGNOSIS — F0283 Dementia in other diseases classified elsewhere, unspecified severity, with mood disturbance: Secondary | ICD-10-CM | POA: Diagnosis not present

## 2022-10-11 DIAGNOSIS — F02818 Dementia in other diseases classified elsewhere, unspecified severity, with other behavioral disturbance: Secondary | ICD-10-CM | POA: Diagnosis not present

## 2022-10-18 DIAGNOSIS — F0284 Dementia in other diseases classified elsewhere, unspecified severity, with anxiety: Secondary | ICD-10-CM | POA: Diagnosis not present

## 2022-10-18 DIAGNOSIS — F02818 Dementia in other diseases classified elsewhere, unspecified severity, with other behavioral disturbance: Secondary | ICD-10-CM | POA: Diagnosis not present

## 2022-10-18 DIAGNOSIS — G309 Alzheimer's disease, unspecified: Secondary | ICD-10-CM | POA: Diagnosis not present

## 2022-10-18 DIAGNOSIS — J189 Pneumonia, unspecified organism: Secondary | ICD-10-CM | POA: Diagnosis not present

## 2022-10-18 DIAGNOSIS — F0283 Dementia in other diseases classified elsewhere, unspecified severity, with mood disturbance: Secondary | ICD-10-CM | POA: Diagnosis not present

## 2022-10-18 DIAGNOSIS — F32A Depression, unspecified: Secondary | ICD-10-CM | POA: Diagnosis not present

## 2022-10-20 DIAGNOSIS — M1990 Unspecified osteoarthritis, unspecified site: Secondary | ICD-10-CM | POA: Diagnosis not present

## 2022-10-20 DIAGNOSIS — N183 Chronic kidney disease, stage 3 unspecified: Secondary | ICD-10-CM | POA: Diagnosis not present

## 2022-10-20 DIAGNOSIS — I10 Essential (primary) hypertension: Secondary | ICD-10-CM | POA: Diagnosis not present

## 2022-10-20 DIAGNOSIS — D518 Other vitamin B12 deficiency anemias: Secondary | ICD-10-CM | POA: Diagnosis not present

## 2022-10-20 DIAGNOSIS — G309 Alzheimer's disease, unspecified: Secondary | ICD-10-CM | POA: Diagnosis not present

## 2022-10-20 DIAGNOSIS — F0280 Dementia in other diseases classified elsewhere without behavioral disturbance: Secondary | ICD-10-CM | POA: Diagnosis not present

## 2022-10-20 DIAGNOSIS — E119 Type 2 diabetes mellitus without complications: Secondary | ICD-10-CM | POA: Diagnosis not present

## 2022-10-20 DIAGNOSIS — E559 Vitamin D deficiency, unspecified: Secondary | ICD-10-CM | POA: Diagnosis not present

## 2022-10-20 DIAGNOSIS — E038 Other specified hypothyroidism: Secondary | ICD-10-CM | POA: Diagnosis not present

## 2022-10-20 DIAGNOSIS — E785 Hyperlipidemia, unspecified: Secondary | ICD-10-CM | POA: Diagnosis not present

## 2022-10-20 DIAGNOSIS — E039 Hypothyroidism, unspecified: Secondary | ICD-10-CM | POA: Diagnosis not present

## 2022-10-20 DIAGNOSIS — N189 Chronic kidney disease, unspecified: Secondary | ICD-10-CM | POA: Diagnosis not present

## 2022-10-24 DIAGNOSIS — F32A Depression, unspecified: Secondary | ICD-10-CM | POA: Diagnosis not present

## 2022-10-24 DIAGNOSIS — F0284 Dementia in other diseases classified elsewhere, unspecified severity, with anxiety: Secondary | ICD-10-CM | POA: Diagnosis not present

## 2022-10-24 DIAGNOSIS — F0283 Dementia in other diseases classified elsewhere, unspecified severity, with mood disturbance: Secondary | ICD-10-CM | POA: Diagnosis not present

## 2022-10-24 DIAGNOSIS — G309 Alzheimer's disease, unspecified: Secondary | ICD-10-CM | POA: Diagnosis not present

## 2022-10-24 DIAGNOSIS — J189 Pneumonia, unspecified organism: Secondary | ICD-10-CM | POA: Diagnosis not present

## 2022-10-24 DIAGNOSIS — F02818 Dementia in other diseases classified elsewhere, unspecified severity, with other behavioral disturbance: Secondary | ICD-10-CM | POA: Diagnosis not present

## 2022-10-26 DIAGNOSIS — M1990 Unspecified osteoarthritis, unspecified site: Secondary | ICD-10-CM | POA: Diagnosis not present

## 2022-10-26 DIAGNOSIS — E039 Hypothyroidism, unspecified: Secondary | ICD-10-CM | POA: Diagnosis not present

## 2022-10-26 DIAGNOSIS — F05 Delirium due to known physiological condition: Secondary | ICD-10-CM | POA: Diagnosis not present

## 2022-10-26 DIAGNOSIS — M519 Unspecified thoracic, thoracolumbar and lumbosacral intervertebral disc disorder: Secondary | ICD-10-CM | POA: Diagnosis not present

## 2022-10-26 DIAGNOSIS — F0283 Dementia in other diseases classified elsewhere, unspecified severity, with mood disturbance: Secondary | ICD-10-CM | POA: Diagnosis not present

## 2022-10-26 DIAGNOSIS — G309 Alzheimer's disease, unspecified: Secondary | ICD-10-CM | POA: Diagnosis not present

## 2022-10-26 DIAGNOSIS — Z7951 Long term (current) use of inhaled steroids: Secondary | ICD-10-CM | POA: Diagnosis not present

## 2022-10-26 DIAGNOSIS — E042 Nontoxic multinodular goiter: Secondary | ICD-10-CM | POA: Diagnosis not present

## 2022-10-26 DIAGNOSIS — F02818 Dementia in other diseases classified elsewhere, unspecified severity, with other behavioral disturbance: Secondary | ICD-10-CM | POA: Diagnosis not present

## 2022-10-26 DIAGNOSIS — I440 Atrioventricular block, first degree: Secondary | ICD-10-CM | POA: Diagnosis not present

## 2022-10-26 DIAGNOSIS — M810 Age-related osteoporosis without current pathological fracture: Secondary | ICD-10-CM | POA: Diagnosis not present

## 2022-10-26 DIAGNOSIS — F32A Depression, unspecified: Secondary | ICD-10-CM | POA: Diagnosis not present

## 2022-10-26 DIAGNOSIS — E559 Vitamin D deficiency, unspecified: Secondary | ICD-10-CM | POA: Diagnosis not present

## 2022-10-26 DIAGNOSIS — F0284 Dementia in other diseases classified elsewhere, unspecified severity, with anxiety: Secondary | ICD-10-CM | POA: Diagnosis not present

## 2022-10-26 DIAGNOSIS — N189 Chronic kidney disease, unspecified: Secondary | ICD-10-CM | POA: Diagnosis not present

## 2022-10-26 DIAGNOSIS — J189 Pneumonia, unspecified organism: Secondary | ICD-10-CM | POA: Diagnosis not present

## 2022-10-26 DIAGNOSIS — R269 Unspecified abnormalities of gait and mobility: Secondary | ICD-10-CM | POA: Diagnosis not present

## 2022-10-26 DIAGNOSIS — J984 Other disorders of lung: Secondary | ICD-10-CM | POA: Diagnosis not present

## 2022-10-26 DIAGNOSIS — E785 Hyperlipidemia, unspecified: Secondary | ICD-10-CM | POA: Diagnosis not present

## 2022-10-26 DIAGNOSIS — D631 Anemia in chronic kidney disease: Secondary | ICD-10-CM | POA: Diagnosis not present

## 2022-10-26 DIAGNOSIS — J302 Other seasonal allergic rhinitis: Secondary | ICD-10-CM | POA: Diagnosis not present

## 2022-10-26 DIAGNOSIS — E538 Deficiency of other specified B group vitamins: Secondary | ICD-10-CM | POA: Diagnosis not present

## 2022-10-26 DIAGNOSIS — K219 Gastro-esophageal reflux disease without esophagitis: Secondary | ICD-10-CM | POA: Diagnosis not present

## 2022-10-26 DIAGNOSIS — I129 Hypertensive chronic kidney disease with stage 1 through stage 4 chronic kidney disease, or unspecified chronic kidney disease: Secondary | ICD-10-CM | POA: Diagnosis not present

## 2022-10-26 DIAGNOSIS — K589 Irritable bowel syndrome without diarrhea: Secondary | ICD-10-CM | POA: Diagnosis not present

## 2022-10-28 DIAGNOSIS — I10 Essential (primary) hypertension: Secondary | ICD-10-CM | POA: Diagnosis not present

## 2022-10-28 DIAGNOSIS — E038 Other specified hypothyroidism: Secondary | ICD-10-CM | POA: Diagnosis not present

## 2022-10-28 DIAGNOSIS — K529 Noninfective gastroenteritis and colitis, unspecified: Secondary | ICD-10-CM | POA: Diagnosis not present

## 2022-10-28 DIAGNOSIS — K219 Gastro-esophageal reflux disease without esophagitis: Secondary | ICD-10-CM | POA: Diagnosis not present

## 2022-10-28 DIAGNOSIS — K589 Irritable bowel syndrome without diarrhea: Secondary | ICD-10-CM | POA: Diagnosis not present

## 2022-10-28 DIAGNOSIS — J984 Other disorders of lung: Secondary | ICD-10-CM | POA: Diagnosis not present

## 2022-10-28 DIAGNOSIS — D649 Anemia, unspecified: Secondary | ICD-10-CM | POA: Diagnosis not present

## 2022-10-28 DIAGNOSIS — E782 Mixed hyperlipidemia: Secondary | ICD-10-CM | POA: Diagnosis not present

## 2022-10-28 DIAGNOSIS — E559 Vitamin D deficiency, unspecified: Secondary | ICD-10-CM | POA: Diagnosis not present

## 2022-10-28 DIAGNOSIS — Z79899 Other long term (current) drug therapy: Secondary | ICD-10-CM | POA: Diagnosis not present

## 2022-10-28 DIAGNOSIS — E785 Hyperlipidemia, unspecified: Secondary | ICD-10-CM | POA: Diagnosis not present

## 2022-10-28 DIAGNOSIS — M1990 Unspecified osteoarthritis, unspecified site: Secondary | ICD-10-CM | POA: Diagnosis not present

## 2022-10-28 DIAGNOSIS — N189 Chronic kidney disease, unspecified: Secondary | ICD-10-CM | POA: Diagnosis not present

## 2022-10-28 DIAGNOSIS — D518 Other vitamin B12 deficiency anemias: Secondary | ICD-10-CM | POA: Diagnosis not present

## 2022-10-28 DIAGNOSIS — E039 Hypothyroidism, unspecified: Secondary | ICD-10-CM | POA: Diagnosis not present

## 2022-10-28 DIAGNOSIS — E119 Type 2 diabetes mellitus without complications: Secondary | ICD-10-CM | POA: Diagnosis not present

## 2022-11-02 DIAGNOSIS — F32A Depression, unspecified: Secondary | ICD-10-CM | POA: Diagnosis not present

## 2022-11-02 DIAGNOSIS — G309 Alzheimer's disease, unspecified: Secondary | ICD-10-CM | POA: Diagnosis not present

## 2022-11-02 DIAGNOSIS — J189 Pneumonia, unspecified organism: Secondary | ICD-10-CM | POA: Diagnosis not present

## 2022-11-02 DIAGNOSIS — F02818 Dementia in other diseases classified elsewhere, unspecified severity, with other behavioral disturbance: Secondary | ICD-10-CM | POA: Diagnosis not present

## 2022-11-02 DIAGNOSIS — F0283 Dementia in other diseases classified elsewhere, unspecified severity, with mood disturbance: Secondary | ICD-10-CM | POA: Diagnosis not present

## 2022-11-02 DIAGNOSIS — F0284 Dementia in other diseases classified elsewhere, unspecified severity, with anxiety: Secondary | ICD-10-CM | POA: Diagnosis not present

## 2022-11-09 DIAGNOSIS — F02818 Dementia in other diseases classified elsewhere, unspecified severity, with other behavioral disturbance: Secondary | ICD-10-CM | POA: Diagnosis not present

## 2022-11-09 DIAGNOSIS — F0283 Dementia in other diseases classified elsewhere, unspecified severity, with mood disturbance: Secondary | ICD-10-CM | POA: Diagnosis not present

## 2022-11-09 DIAGNOSIS — G309 Alzheimer's disease, unspecified: Secondary | ICD-10-CM | POA: Diagnosis not present

## 2022-11-09 DIAGNOSIS — F0284 Dementia in other diseases classified elsewhere, unspecified severity, with anxiety: Secondary | ICD-10-CM | POA: Diagnosis not present

## 2022-11-09 DIAGNOSIS — J189 Pneumonia, unspecified organism: Secondary | ICD-10-CM | POA: Diagnosis not present

## 2022-11-09 DIAGNOSIS — F32A Depression, unspecified: Secondary | ICD-10-CM | POA: Diagnosis not present

## 2022-11-13 DIAGNOSIS — E119 Type 2 diabetes mellitus without complications: Secondary | ICD-10-CM | POA: Diagnosis not present

## 2022-11-13 DIAGNOSIS — E785 Hyperlipidemia, unspecified: Secondary | ICD-10-CM | POA: Diagnosis not present

## 2022-11-13 DIAGNOSIS — N189 Chronic kidney disease, unspecified: Secondary | ICD-10-CM | POA: Diagnosis not present

## 2022-11-13 DIAGNOSIS — E038 Other specified hypothyroidism: Secondary | ICD-10-CM | POA: Diagnosis not present

## 2022-11-13 DIAGNOSIS — G309 Alzheimer's disease, unspecified: Secondary | ICD-10-CM | POA: Diagnosis not present

## 2022-11-13 DIAGNOSIS — E039 Hypothyroidism, unspecified: Secondary | ICD-10-CM | POA: Diagnosis not present

## 2022-11-13 DIAGNOSIS — I10 Essential (primary) hypertension: Secondary | ICD-10-CM | POA: Diagnosis not present

## 2022-11-13 DIAGNOSIS — E559 Vitamin D deficiency, unspecified: Secondary | ICD-10-CM | POA: Diagnosis not present

## 2022-11-13 DIAGNOSIS — M1990 Unspecified osteoarthritis, unspecified site: Secondary | ICD-10-CM | POA: Diagnosis not present

## 2022-11-13 DIAGNOSIS — D518 Other vitamin B12 deficiency anemias: Secondary | ICD-10-CM | POA: Diagnosis not present

## 2022-11-13 DIAGNOSIS — F0280 Dementia in other diseases classified elsewhere without behavioral disturbance: Secondary | ICD-10-CM | POA: Diagnosis not present

## 2022-11-13 DIAGNOSIS — N183 Chronic kidney disease, stage 3 unspecified: Secondary | ICD-10-CM | POA: Diagnosis not present

## 2022-11-25 DIAGNOSIS — E785 Hyperlipidemia, unspecified: Secondary | ICD-10-CM | POA: Diagnosis not present

## 2022-11-25 DIAGNOSIS — M25511 Pain in right shoulder: Secondary | ICD-10-CM | POA: Diagnosis not present

## 2022-11-25 DIAGNOSIS — G309 Alzheimer's disease, unspecified: Secondary | ICD-10-CM | POA: Diagnosis not present

## 2022-11-25 DIAGNOSIS — K589 Irritable bowel syndrome without diarrhea: Secondary | ICD-10-CM | POA: Diagnosis not present

## 2022-11-25 DIAGNOSIS — J984 Other disorders of lung: Secondary | ICD-10-CM | POA: Diagnosis not present

## 2022-11-25 DIAGNOSIS — I10 Essential (primary) hypertension: Secondary | ICD-10-CM | POA: Diagnosis not present

## 2022-11-25 DIAGNOSIS — K219 Gastro-esophageal reflux disease without esophagitis: Secondary | ICD-10-CM | POA: Diagnosis not present

## 2022-11-25 DIAGNOSIS — E559 Vitamin D deficiency, unspecified: Secondary | ICD-10-CM | POA: Diagnosis not present

## 2022-11-25 DIAGNOSIS — N189 Chronic kidney disease, unspecified: Secondary | ICD-10-CM | POA: Diagnosis not present

## 2022-11-25 DIAGNOSIS — D649 Anemia, unspecified: Secondary | ICD-10-CM | POA: Diagnosis not present

## 2022-12-15 DIAGNOSIS — G309 Alzheimer's disease, unspecified: Secondary | ICD-10-CM | POA: Diagnosis not present

## 2022-12-15 DIAGNOSIS — E119 Type 2 diabetes mellitus without complications: Secondary | ICD-10-CM | POA: Diagnosis not present

## 2022-12-15 DIAGNOSIS — N189 Chronic kidney disease, unspecified: Secondary | ICD-10-CM | POA: Diagnosis not present

## 2022-12-15 DIAGNOSIS — I10 Essential (primary) hypertension: Secondary | ICD-10-CM | POA: Diagnosis not present

## 2022-12-15 DIAGNOSIS — E039 Hypothyroidism, unspecified: Secondary | ICD-10-CM | POA: Diagnosis not present

## 2022-12-15 DIAGNOSIS — E038 Other specified hypothyroidism: Secondary | ICD-10-CM | POA: Diagnosis not present

## 2022-12-15 DIAGNOSIS — M1990 Unspecified osteoarthritis, unspecified site: Secondary | ICD-10-CM | POA: Diagnosis not present

## 2022-12-15 DIAGNOSIS — F0280 Dementia in other diseases classified elsewhere without behavioral disturbance: Secondary | ICD-10-CM | POA: Diagnosis not present

## 2022-12-15 DIAGNOSIS — E785 Hyperlipidemia, unspecified: Secondary | ICD-10-CM | POA: Diagnosis not present

## 2022-12-15 DIAGNOSIS — D518 Other vitamin B12 deficiency anemias: Secondary | ICD-10-CM | POA: Diagnosis not present

## 2022-12-15 DIAGNOSIS — E559 Vitamin D deficiency, unspecified: Secondary | ICD-10-CM | POA: Diagnosis not present

## 2022-12-15 DIAGNOSIS — N183 Chronic kidney disease, stage 3 unspecified: Secondary | ICD-10-CM | POA: Diagnosis not present

## 2023-06-25 ENCOUNTER — Emergency Department (HOSPITAL_COMMUNITY)

## 2023-06-25 ENCOUNTER — Other Ambulatory Visit: Payer: Self-pay

## 2023-06-25 ENCOUNTER — Inpatient Hospital Stay (HOSPITAL_COMMUNITY)
Admission: EM | Admit: 2023-06-25 | Discharge: 2023-06-30 | DRG: 445 | Disposition: A | Discharge status: Skilled Nursing Facility | Source: Skilled Nursing Facility | Attending: Student in an Organized Health Care Education/Training Program | Admitting: Student in an Organized Health Care Education/Training Program

## 2023-06-25 ENCOUNTER — Encounter (HOSPITAL_COMMUNITY): Payer: Self-pay | Admitting: Emergency Medicine

## 2023-06-25 DIAGNOSIS — N1832 Chronic kidney disease, stage 3b: Secondary | ICD-10-CM | POA: Diagnosis present

## 2023-06-25 DIAGNOSIS — F05 Delirium due to known physiological condition: Secondary | ICD-10-CM | POA: Diagnosis present

## 2023-06-25 DIAGNOSIS — K8051 Calculus of bile duct without cholangitis or cholecystitis with obstruction: Secondary | ICD-10-CM

## 2023-06-25 DIAGNOSIS — Z981 Arthrodesis status: Secondary | ICD-10-CM

## 2023-06-25 DIAGNOSIS — Z87440 Personal history of urinary (tract) infections: Secondary | ICD-10-CM

## 2023-06-25 DIAGNOSIS — S0181XA Laceration without foreign body of other part of head, initial encounter: Secondary | ICD-10-CM | POA: Diagnosis present

## 2023-06-25 DIAGNOSIS — K8050 Calculus of bile duct without cholangitis or cholecystitis without obstruction: Principal | ICD-10-CM | POA: Diagnosis present

## 2023-06-25 DIAGNOSIS — E860 Dehydration: Secondary | ICD-10-CM | POA: Diagnosis present

## 2023-06-25 DIAGNOSIS — N189 Chronic kidney disease, unspecified: Secondary | ICD-10-CM

## 2023-06-25 DIAGNOSIS — Z66 Do not resuscitate: Secondary | ICD-10-CM | POA: Diagnosis present

## 2023-06-25 DIAGNOSIS — F0283 Dementia in other diseases classified elsewhere, unspecified severity, with mood disturbance: Secondary | ICD-10-CM | POA: Diagnosis present

## 2023-06-25 DIAGNOSIS — Z1152 Encounter for screening for COVID-19: Secondary | ICD-10-CM

## 2023-06-25 DIAGNOSIS — F03918 Unspecified dementia, unspecified severity, with other behavioral disturbance: Secondary | ICD-10-CM | POA: Diagnosis present

## 2023-06-25 DIAGNOSIS — F32A Depression, unspecified: Secondary | ICD-10-CM | POA: Diagnosis present

## 2023-06-25 DIAGNOSIS — Z882 Allergy status to sulfonamides status: Secondary | ICD-10-CM

## 2023-06-25 DIAGNOSIS — Z7951 Long term (current) use of inhaled steroids: Secondary | ICD-10-CM

## 2023-06-25 DIAGNOSIS — Y92129 Unspecified place in nursing home as the place of occurrence of the external cause: Secondary | ICD-10-CM

## 2023-06-25 DIAGNOSIS — Z7982 Long term (current) use of aspirin: Secondary | ICD-10-CM

## 2023-06-25 DIAGNOSIS — I959 Hypotension, unspecified: Secondary | ICD-10-CM | POA: Diagnosis present

## 2023-06-25 DIAGNOSIS — R7989 Other specified abnormal findings of blood chemistry: Secondary | ICD-10-CM

## 2023-06-25 DIAGNOSIS — Z79899 Other long term (current) drug therapy: Secondary | ICD-10-CM

## 2023-06-25 DIAGNOSIS — E039 Hypothyroidism, unspecified: Secondary | ICD-10-CM | POA: Diagnosis present

## 2023-06-25 DIAGNOSIS — W19XXXA Unspecified fall, initial encounter: Secondary | ICD-10-CM | POA: Diagnosis present

## 2023-06-25 DIAGNOSIS — Z9049 Acquired absence of other specified parts of digestive tract: Secondary | ICD-10-CM

## 2023-06-25 DIAGNOSIS — F02818 Dementia in other diseases classified elsewhere, unspecified severity, with other behavioral disturbance: Secondary | ICD-10-CM | POA: Diagnosis present

## 2023-06-25 DIAGNOSIS — D631 Anemia in chronic kidney disease: Secondary | ICD-10-CM | POA: Diagnosis present

## 2023-06-25 DIAGNOSIS — Z888 Allergy status to other drugs, medicaments and biological substances status: Secondary | ICD-10-CM

## 2023-06-25 DIAGNOSIS — Z23 Encounter for immunization: Secondary | ICD-10-CM

## 2023-06-25 DIAGNOSIS — F0284 Dementia in other diseases classified elsewhere, unspecified severity, with anxiety: Secondary | ICD-10-CM | POA: Diagnosis present

## 2023-06-25 DIAGNOSIS — S0191XA Laceration without foreign body of unspecified part of head, initial encounter: Secondary | ICD-10-CM | POA: Diagnosis present

## 2023-06-25 DIAGNOSIS — Z79818 Long term (current) use of other agents affecting estrogen receptors and estrogen levels: Secondary | ICD-10-CM

## 2023-06-25 DIAGNOSIS — Z885 Allergy status to narcotic agent status: Secondary | ICD-10-CM

## 2023-06-25 DIAGNOSIS — E440 Moderate protein-calorie malnutrition: Secondary | ICD-10-CM | POA: Diagnosis present

## 2023-06-25 DIAGNOSIS — I129 Hypertensive chronic kidney disease with stage 1 through stage 4 chronic kidney disease, or unspecified chronic kidney disease: Secondary | ICD-10-CM | POA: Diagnosis present

## 2023-06-25 DIAGNOSIS — N183 Chronic kidney disease, stage 3 unspecified: Secondary | ICD-10-CM | POA: Diagnosis present

## 2023-06-25 DIAGNOSIS — K2289 Other specified disease of esophagus: Secondary | ICD-10-CM | POA: Diagnosis present

## 2023-06-25 DIAGNOSIS — G309 Alzheimer's disease, unspecified: Secondary | ICD-10-CM | POA: Diagnosis present

## 2023-06-25 DIAGNOSIS — Z79890 Hormone replacement therapy (postmenopausal): Secondary | ICD-10-CM

## 2023-06-25 DIAGNOSIS — Z8673 Personal history of transient ischemic attack (TIA), and cerebral infarction without residual deficits: Secondary | ICD-10-CM

## 2023-06-25 DIAGNOSIS — E7800 Pure hypercholesterolemia, unspecified: Secondary | ICD-10-CM | POA: Diagnosis present

## 2023-06-25 DIAGNOSIS — J449 Chronic obstructive pulmonary disease, unspecified: Secondary | ICD-10-CM | POA: Diagnosis present

## 2023-06-25 DIAGNOSIS — Z9889 Other specified postprocedural states: Secondary | ICD-10-CM

## 2023-06-25 DIAGNOSIS — R748 Abnormal levels of other serum enzymes: Secondary | ICD-10-CM | POA: Diagnosis present

## 2023-06-25 DIAGNOSIS — W06XXXA Fall from bed, initial encounter: Secondary | ICD-10-CM | POA: Diagnosis present

## 2023-06-25 DIAGNOSIS — K219 Gastro-esophageal reflux disease without esophagitis: Secondary | ICD-10-CM | POA: Diagnosis present

## 2023-06-25 DIAGNOSIS — K3189 Other diseases of stomach and duodenum: Secondary | ICD-10-CM | POA: Diagnosis present

## 2023-06-25 DIAGNOSIS — Z6833 Body mass index (BMI) 33.0-33.9, adult: Secondary | ICD-10-CM

## 2023-06-25 LAB — CBC
HCT: 38 % (ref 36.0–46.0)
Hemoglobin: 12.3 g/dL (ref 12.0–15.0)
MCH: 28.2 pg (ref 26.0–34.0)
MCHC: 32.4 g/dL (ref 30.0–36.0)
MCV: 87.2 fL (ref 80.0–100.0)
Platelets: 168 10*3/uL (ref 150–400)
RBC: 4.36 MIL/uL (ref 3.87–5.11)
RDW: 13.2 % (ref 11.5–15.5)
WBC: 8.1 10*3/uL (ref 4.0–10.5)
nRBC: 0 % (ref 0.0–0.2)

## 2023-06-25 LAB — COMPREHENSIVE METABOLIC PANEL
ALT: 87 U/L — ABNORMAL HIGH (ref 0–44)
AST: 205 U/L — ABNORMAL HIGH (ref 15–41)
Albumin: 2.9 g/dL — ABNORMAL LOW (ref 3.5–5.0)
Alkaline Phosphatase: 266 U/L — ABNORMAL HIGH (ref 38–126)
Anion gap: 7 (ref 5–15)
BUN: 29 mg/dL — ABNORMAL HIGH (ref 8–23)
CO2: 22 mmol/L (ref 22–32)
Calcium: 8.6 mg/dL — ABNORMAL LOW (ref 8.9–10.3)
Chloride: 113 mmol/L — ABNORMAL HIGH (ref 98–111)
Creatinine, Ser: 1.63 mg/dL — ABNORMAL HIGH (ref 0.44–1.00)
GFR, Estimated: 31 mL/min — ABNORMAL LOW (ref >60)
Glucose, Bld: 144 mg/dL — ABNORMAL HIGH (ref 70–99)
Potassium: 3.9 mmol/L (ref 3.5–5.1)
Sodium: 142 mmol/L (ref 135–145)
Total Bilirubin: 1.6 mg/dL — ABNORMAL HIGH (ref 0.3–1.2)
Total Protein: 5.5 g/dL — ABNORMAL LOW (ref 6.5–8.1)

## 2023-06-25 LAB — PROTIME-INR
INR: 1 (ref 0.8–1.2)
Prothrombin Time: 13.4 seconds (ref 11.4–15.2)

## 2023-06-25 LAB — SAMPLE TO BLOOD BANK

## 2023-06-25 LAB — LACTIC ACID, PLASMA: Lactic Acid, Venous: 1.6 mmol/L (ref 0.5–1.9)

## 2023-06-25 LAB — I-STAT CHEM 8, ED
BUN: 29 mg/dL — ABNORMAL HIGH (ref 8–23)
Calcium, Ion: 1.25 mmol/L (ref 1.15–1.40)
Chloride: 113 mmol/L — ABNORMAL HIGH (ref 98–111)
Creatinine, Ser: 1.7 mg/dL — ABNORMAL HIGH (ref 0.44–1.00)
Glucose, Bld: 140 mg/dL — ABNORMAL HIGH (ref 70–99)
HCT: 35 % — ABNORMAL LOW (ref 36.0–46.0)
Hemoglobin: 11.9 g/dL — ABNORMAL LOW (ref 12.0–15.0)
Potassium: 3.9 mmol/L (ref 3.5–5.1)
Sodium: 144 mmol/L (ref 135–145)
TCO2: 22 mmol/L (ref 22–32)

## 2023-06-25 LAB — LIPASE, BLOOD: Lipase: 33 U/L (ref 11–51)

## 2023-06-25 LAB — SARS CORONAVIRUS 2 BY RT PCR: SARS Coronavirus 2 by RT PCR: NEGATIVE

## 2023-06-25 LAB — TROPONIN I (HIGH SENSITIVITY): Troponin I (High Sensitivity): 12 ng/L (ref <18)

## 2023-06-25 LAB — ETHANOL: Alcohol, Ethyl (B): <10 mg/dL (ref <10)

## 2023-06-25 MED ORDER — DIVALPROEX SODIUM 125 MG PO DR TAB
125.0000 mg | DELAYED_RELEASE_TABLET | Freq: Two times a day (BID) | ORAL | Status: DC
  Administered 2023-06-26: 125 mg via ORAL
  Filled 2023-06-25: qty 1

## 2023-06-25 MED ORDER — TETANUS-DIPHTH-ACELL PERTUSSIS 5-2.5-18.5 LF-MCG/0.5 IM SUSY
0.5000 mL | PREFILLED_SYRINGE | Freq: Once | INTRAMUSCULAR | Status: AC
  Administered 2023-06-25: 0.5 mL via INTRAMUSCULAR
  Filled 2023-06-25: qty 0.5

## 2023-06-25 MED ORDER — ENOXAPARIN SODIUM 30 MG/0.3ML IJ SOSY
30.0000 mg | PREFILLED_SYRINGE | INTRAMUSCULAR | Status: DC
  Administered 2023-06-25 – 2023-06-26 (×2): 30 mg via SUBCUTANEOUS
  Filled 2023-06-25 (×2): qty 0.3

## 2023-06-25 MED ORDER — LIDOCAINE-EPINEPHRINE (PF) 2 %-1:200000 IJ SOLN
10.0000 mL | Freq: Once | INTRAMUSCULAR | Status: AC
  Administered 2023-06-25: 10 mL
  Filled 2023-06-25: qty 20

## 2023-06-25 MED ORDER — ONDANSETRON HCL 4 MG/2ML IJ SOLN
4.0000 mg | Freq: Once | INTRAMUSCULAR | Status: AC
  Administered 2023-06-25: 4 mg via INTRAVENOUS
  Filled 2023-06-25: qty 2

## 2023-06-25 MED ORDER — QUETIAPINE FUMARATE 50 MG PO TABS
50.0000 mg | ORAL_TABLET | Freq: Every day | ORAL | Status: DC
  Administered 2023-06-26 – 2023-06-29 (×4): 50 mg via ORAL
  Filled 2023-06-25 (×4): qty 1

## 2023-06-25 MED ORDER — IOHEXOL 350 MG/ML SOLN
75.0000 mL | Freq: Once | INTRAVENOUS | Status: AC | PRN
  Administered 2023-06-25: 75 mL via INTRAVENOUS

## 2023-06-25 MED ORDER — FLUTICASONE FUROATE-VILANTEROL 100-25 MCG/ACT IN AEPB
1.0000 | INHALATION_SPRAY | Freq: Every day | RESPIRATORY_TRACT | Status: DC
  Administered 2023-06-26 – 2023-06-30 (×5): 1 via RESPIRATORY_TRACT
  Filled 2023-06-25: qty 28

## 2023-06-25 MED ORDER — LACTATED RINGERS IV BOLUS
1000.0000 mL | Freq: Once | INTRAVENOUS | Status: AC
  Administered 2023-06-25: 1000 mL via INTRAVENOUS

## 2023-06-25 MED ORDER — SERTRALINE HCL 100 MG PO TABS
100.0000 mg | ORAL_TABLET | Freq: Every day | ORAL | Status: DC
  Administered 2023-06-26 – 2023-06-30 (×4): 100 mg via ORAL
  Filled 2023-06-25 (×4): qty 1

## 2023-06-25 NOTE — ED Triage Notes (Signed)
Patient arrived via [facility] from [facility] for fall. Patient reported that she tried get out the bed and fell, hitting head; laceration noted right forehead, also having nausea and vomiting. [place] C-collar applied arrival.

## 2023-06-25 NOTE — Progress Notes (Signed)
Orthopedic Tech Progress Note Patient Details:  [name] [name] November 15, 1941 [id number]  Patient ID: [name] [name], female   DOB: 09/10/1941, 82 y.o.   MRN: [id number] Level I; not currently needed. [place] A Clair Alfieri 06/25/2023, 4:20 PM

## 2023-06-25 NOTE — TOC CAGE-AID Note (Signed)
Transition of Care [facility]) - CAGE-AID Screening   Patient Details  Name: [name] [name] MRN: [id number] Date of Birth: 08/15/1941  [facility]) CM/SW Contact:    Katha Hamming, RN Phone Number: 06/25/2023, 7:45 PM     CAGE-AID Screening: Substance Abuse Screening unable to be completed due to: : Patient unable to participate (hx dementia)

## 2023-06-25 NOTE — ED Notes (Signed)
ED TO INPATIENT HANDOFF REPORT  ED Nurse Name and Phone #: [phone] Quillian Quince Name/Age/Gender [name] [name] 82 y.o. female Room/Bed: 034C/034C  Code Status   Code Status: Not on file  Home/SNF/Other [facility] @ [address] [place] [phone] Patient oriented to: self Is this baseline? Yes   Triage Complete: Triage complete  Chief Complaint Level 2 Fall  Triage Note Patient arrived via [facility] from [facility] for fall. Patient reported that she tried get out the bed and fell, hitting head; laceration noted right forehead, also having nausea and vomiting. Aspen C-collar applied arrival.    Allergies Allergies  Allergen Reactions   Codeine     hallucinations   Darvocet [Propoxyphene N-Acetaminophen]    Hydrocodone    Morphine     hallucinations   Pravachol    Sulfa Antibiotics    Zocor [Simvastatin - High Dose]     Level of Care/Admitting Diagnosis ED Disposition     ED Disposition  Admit   Condition  --   Comment  The patient appears reasonably stabilized for admission considering the current resources, flow, and capabilities available in the ED at this time, and I doubt any other [facility] requiring further screening and/or treatment in the ED prior to admission is  present.          B Medical/Surgery History Past Medical History:  Diagnosis Date   Anxiety    GERD (gastroesophageal reflux disease)    Hypercholesteremia    Hypertension    Past Surgical History:  Procedure Laterality Date   BACK SURGERY     NECK FUSION       A IV Location/Drains/Wounds Patient Lines/Drains/Airways Status     Active Line/Drains/Airways     Name Placement date Placement time Site Days   Peripheral IV 06/25/23 18 G Left Antecubital 06/25/23  1615  Antecubital  less than 1   Peripheral IV 06/25/23 20 G Right Antecubital 06/25/23  1618  Antecubital  less than 1            Intake/Output Last 24 hours  Intake/Output  Summary (Last 24 hours) at 06/25/2023 2011 Last data filed at 06/25/2023 1806 Gross per 24 hour  Intake 1000 ml  Output --  Net 1000 ml    Labs/Imaging Results for orders placed or performed during the hospital encounter of 06/25/23 (from the past 48 hour(s))  Sample to Blood Bank     Status: None   Collection Time: 06/25/23  4:08 PM  Result Value Ref Range   Blood Bank Specimen SAMPLE AVAILABLE FOR TESTING    Sample Expiration      06/28/2023,2359 Performed at [facility], [address], [place], [place] [address]   Comprehensive metabolic panel     Status: Abnormal   Collection Time: 06/25/23  4:15 PM  Result Value Ref Range   Sodium 142 135 - 145 mmol/L   Potassium 3.9 3.5 - 5.1 mmol/L   Chloride 113 (H) 98 - 111 mmol/L   CO2 22 22 - 32 mmol/L   Glucose, Bld 144 (H) 70 - 99 mg/dL    Comment: Glucose reference range applies only to samples taken after fasting for at least 8 hours.   BUN 29 (H) 8 - 23 mg/dL   Creatinine, Ser 5.62 (H) 0.44 - 1.00 mg/dL   Calcium 8.6 (L) 8.9 - 10.3 mg/dL   Total Protein 5.5 (L) 6.5 - 8.1 g/dL   Albumin 2.9 (L) 3.5 -  5.0 g/dL   AST 119 (H) 15 - 41 U/L   ALT 87 (H) 0 - 44 U/L   Alkaline Phosphatase 266 (H) 38 - 126 U/L   Total Bilirubin 1.6 (H) 0.3 - 1.2 mg/dL   GFR, Estimated 31 (L) >60 mL/min    Comment: (NOTE) Calculated using the CKD-EPI Creatinine Equation (2021)    Anion gap 7 5 - 15    Comment: Performed at [facility], [address] [place], [place] [address]  CBC     Status: None   Collection Time: 06/25/23  4:15 PM  Result Value Ref Range   WBC 8.1 4.0 - 10.5 K/uL   RBC 4.36 3.87 - 5.11 MIL/uL   Hemoglobin 12.3 12.0 - 15.0 g/dL   HCT 95.6 21.3 - 08.6 %   MCV 87.2 80.0 - 100.0 fL   MCH 28.2 26.0 - 34.0 pg   MCHC 32.4 30.0 - 36.0 g/dL   RDW 57.8 46.9 - 62.9 %   Platelets 168 150 - 400 K/uL   nRBC 0.0 0.0 - 0.2 %    Comment: Performed at [facility], [address], [place], [place] [address]   Protime-INR     Status: None   Collection Time: 06/25/23  4:15 PM  Result Value Ref Range   Prothrombin Time 13.4 11.4 - 15.2 seconds   INR 1.0 0.8 - 1.2    Comment: (NOTE) INR goal varies based on device and disease states. Performed at [facility], [address] [place], [place] [address]   Troponin I (High Sensitivity)     Status: None   Collection Time: 06/25/23  4:15 PM  Result Value Ref Range   Troponin I (High Sensitivity) 12 <18 ng/L    Comment: (NOTE) Elevated high sensitivity troponin I (hsTnI) values and significant  changes across serial measurements may suggest ACS but many other  chronic and acute conditions are known to elevate hsTnI results.  Refer to the "Links" section for chest pain algorithms and additional  guidance. Performed at [facility], [address], [place], [place] [address]   I-Stat Chem 8, ED     Status: Abnormal   Collection Time: 06/25/23  4:26 PM  Result Value Ref Range   Sodium 144 135 - 145 mmol/L   Potassium 3.9 3.5 - 5.1 mmol/L   Chloride 113 (H) 98 - 111 mmol/L   BUN 29 (H) 8 - 23 mg/dL   Creatinine, Ser 5.36 (H) 0.44 - 1.00 mg/dL   Glucose, Bld 644 (H) 70 - 99 mg/dL    Comment: Glucose reference range applies only to samples taken after fasting for at least 8 hours.   Calcium, Ion 1.25 1.15 - 1.40 mmol/L   TCO2 22 22 - 32 mmol/L   Hemoglobin 11.9 (L) 12.0 - 15.0 g/dL   HCT 03.4 (L) 74.2 - 59.5 %  Lactic acid, plasma     Status: None   Collection Time: 06/25/23  5:55 PM  Result Value Ref Range   Lactic Acid, Venous 1.6 0.5 - 1.9 mmol/L    Comment: Performed at [facility], [address], [place], [place] [address]  Ethanol     Status: None   Collection Time: 06/25/23  5:58 PM  Result Value Ref Range   Alcohol, Ethyl (B) <10 <10 mg/dL    Comment: (NOTE) Lowest detectable limit for serum alcohol is 10 mg/dL.  For medical purposes only. Performed at [facility], [address], [place],  [place] [address]    CT CHEST ABDOMEN PELVIS W CONTRAST  Result Date: 06/25/2023 CLINICAL DATA:  Polytrauma.  Fall, multiple injuries. EXAM: CT CHEST, ABDOMEN, AND PELVIS WITH CONTRAST TECHNIQUE: Multidetector CT imaging of the chest, abdomen and pelvis was performed following the standard protocol during bolus administration of intravenous contrast. RADIATION DOSE REDUCTION: This exam was performed according to the departmental dose-optimization program which includes automated exposure control, adjustment of the mA and/or kV according to patient size and/or use of iterative reconstruction technique. CONTRAST:  75mL OMNIPAQUE IOHEXOL 350 MG/ML SOLN COMPARISON:  CT abdomen pelvis 03/20/2023, CT chest 01/04/2018 FINDINGS: CT CHEST FINDINGS Cardiovascular: Mitral annular calcifications. Normal heart size. No pericardial effusion. Mediastinum/Nodes: Heterogeneous appearance of the thyroid with multiple hypodense nodules measuring up to 0.5 cm, not significantly changed compared to 11/17/2017 (series 3, image 2). No enlarged mediastinal, hilar, or axillary lymph nodes. The esophagus and trachea demonstrate no significant abnormality. Lungs/Pleura: Subsegmental dependent atelectasis. No other focal consolidation no pleural effusion or pneumothorax. Musculoskeletal: No chest wall mass or suspicious bone lesions identified. Old fractures of the left third through fifth ribs. Compression fracture of the T12 vertebral body is unchanged compared to 03/20/2023. No acute osseous finding. CT ABDOMEN PELVIS FINDINGS Hepatobiliary: No focal liver abnormality is seen. Status post cholecystectomy. Mild intrahepatic dilatation. Extrahepatic bile duct is also dilated measuring 1.1 cm diameter (series 6, image 45). A 1.2 cm oval density is seen within the extrahepatic bile duct, similar to 03/20/2023 (series 3, image 59; series 6, image 45. The second density seen at the level of the ampulla on prior exam is not appreciated on today's  exam. Pancreas: Unremarkable. No pancreatic ductal dilatation or surrounding inflammatory changes. Spleen: Normal in size. Adrenals/Urinary Tract: Adrenal glands are unremarkable. Atrophic kidneys with multifocal cortical scarring. Multiple bilateral fluid density cortical cysts. No follow-up imaging is indicated. No suspicious mass or hydronephrosis. Urinary bladder is unremarkable. Stomach/Bowel: Small hiatal hernia. Stomach is otherwise within normal limits. Appendix is not directly visualized, however there are no pericecal inflammatory changes. Diverticulosis of the sigmoid colon. No evidence of bowel wall thickening, distention, or inflammatory changes. Vascular/Lymphatic: Aortic atherosclerosis. No enlarged abdominal or pelvic lymph nodes. Reproductive: Uterus and bilateral adnexa are unremarkable. Other: No abdominal wall hernia or abnormality. No abdominopelvic ascites. Musculoskeletal: No acute or suspicious osseous findings. Bilateral pedicle screw and rod fixation spans L3-L4 with intervertebral spacer. Spinal instrumentation is intact. Stable grade 1 anterolisthesis of L4 on L5. IMPRESSION: 1. No evidence of acute traumatic injury in the chest, abdomen, or pelvis. 2. Status post cholecystectomy with mild intrahepatic and extrahepatic bile duct dilatation. A 1.2 cm oval density is seen within the extrahepatic bile duct, similar to 03/20/2023 and suggestive of choledocholithiasis. The second density noted at the level of the ampulla on prior exam is not appreciated on today's exam. Consider further evaluation with ERCP or MRCP. 3. Diverticulosis of the sigmoid colon without findings of diverticulitis. 4. Heterogeneous appearance of the thyroid with multiple hypodense nodules measuring up to 0.5 cm, not significantly changed compared to 11/17/2017. No follow-up imaging is indicated. 5. Aortic atherosclerosis. Aortic Atherosclerosis (ICD10-I70.0). Electronically Signed   By: Sherron Ales M.D.   On:  06/25/2023 17:26   CT HEAD WO CONTRAST  Result Date: 06/25/2023 CLINICAL DATA:  Head trauma, moderate-severe; Facial trauma, blunt; Polytrauma, blunt. Fall. EXAM: CT HEAD WITHOUT CONTRAST CT MAXILLOFACIAL WITHOUT CONTRAST CT CERVICAL SPINE WITHOUT CONTRAST TECHNIQUE: Multidetector CT imaging of the  head, cervical spine, and maxillofacial structures were performed using the standard protocol without intravenous contrast. Multiplanar CT image reconstructions of the cervical spine and maxillofacial structures were also generated. RADIATION DOSE REDUCTION: This exam was performed according to the departmental dose-optimization program which includes automated exposure control, adjustment of the mA and/or kV according to patient size and/or use of iterative reconstruction technique. COMPARISON:  Head CT 05/28/2023 and MRI 05/17/2019. Cervical spine CT 11/17/2017. FINDINGS: CT HEAD FINDINGS Brain: There is no evidence of an acute infarct, intracranial hemorrhage, mass, midline shift, or extra-axial fluid collection. Confluent hypodensities in the cerebral white matter bilaterally are unchanged and nonspecific but compatible with severe chronic small vessel ischemic disease. A small to moderate-sized chronic left parieto-occipital infarct is again noted as well as chronic infarcts in the thalami and cerebellum bilaterally. There is moderate cerebral atrophy. Vascular: Calcified atherosclerosis at the skull base. No hyperdense vessel. Skull: No acute fracture or suspicious osseous lesion. Other: None. CT MAXILLOFACIAL FINDINGS Osseous: No acute fracture, suspicious osseous lesion, or mandibular dislocation. Orbits: Bilateral cataract extraction.  No intraorbital hematoma. Sinuses: Paranasal sinuses and mastoid air cells are clear. Soft tissues: Right lateral forehead scalp laceration and small hematoma. CT CERVICAL SPINE FINDINGS Alignment: Unchanged trace anterolisthesis of C5 on C6. Skull base and vertebrae: No acute  fracture or suspicious osseous lesion is identified within limitations of mild motion artifact. Unchanged ankylosis of the C3-4 vertebral bodies and left-sided posterior elements. Incomplete fusion of the posterior arch of C1. Soft tissues and spinal canal: No prevertebral fluid or swelling. No visible canal hematoma. Disc levels: Multilevel disc degeneration, most advanced at C6-7 and mildly progressed from 2018. Advanced multilevel facet arthrosis. Severe left greater than right neural foraminal stenosis at C6-7 due to uncovertebral spurring. Upper chest: Reported separately on contemporaneous chest CT. Other: None. IMPRESSION: 1. No evidence of acute intracranial abnormality. 2. Right forehead scalp laceration and hematoma. 3. No acute maxillofacial or cervical spine fracture. 4. 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 Electronically Signed   By: Sebastian Ache M.D.   On: 06/25/2023 17:08   CT CERVICAL SPINE WO CONTRAST  Result Date: 06/25/2023 CLINICAL DATA:  Head trauma, moderate-severe; Facial trauma, blunt; Polytrauma, blunt. Fall. EXAM: CT HEAD WITHOUT CONTRAST CT MAXILLOFACIAL WITHOUT CONTRAST CT CERVICAL SPINE WITHOUT CONTRAST TECHNIQUE: Multidetector CT imaging of the head, cervical spine, and maxillofacial structures were performed using the standard protocol without intravenous contrast. Multiplanar CT image reconstructions of the cervical spine and maxillofacial structures were also generated. RADIATION DOSE REDUCTION: This exam was performed according to the departmental dose-optimization program which includes automated exposure control, adjustment of the mA and/or kV according to patient size and/or use of iterative reconstruction technique. COMPARISON:  Head CT 05/28/2023 and MRI 05/17/2019. Cervical spine CT 11/17/2017. FINDINGS: CT HEAD FINDINGS Brain: There is no evidence of an acute infarct, intracranial hemorrhage, mass, midline shift, or extra-axial  fluid collection. Confluent hypodensities in the cerebral white matter bilaterally are unchanged and nonspecific but compatible with severe chronic small vessel ischemic disease. A small to moderate-sized chronic left parieto-occipital infarct is again noted as well as chronic infarcts in the thalami and cerebellum bilaterally. There is moderate cerebral atrophy. Vascular: Calcified atherosclerosis at the skull base. No hyperdense vessel. Skull: No acute fracture or suspicious osseous lesion. Other: None. CT MAXILLOFACIAL FINDINGS Osseous: No acute fracture, suspicious osseous lesion, or mandibular dislocation. Orbits: Bilateral cataract extraction.  No intraorbital hematoma. Sinuses: Paranasal sinuses and mastoid air cells are clear. Soft  tissues: Right lateral forehead scalp laceration and small hematoma. CT CERVICAL SPINE FINDINGS Alignment: Unchanged trace anterolisthesis of C5 on C6. Skull base and vertebrae: No acute fracture or suspicious osseous lesion is identified within limitations of mild motion artifact. Unchanged ankylosis of the C3-4 vertebral bodies and left-sided posterior elements. Incomplete fusion of the posterior arch of C1. Soft tissues and spinal canal: No prevertebral fluid or swelling. No visible canal hematoma. Disc levels: Multilevel disc degeneration, most advanced at C6-7 and mildly progressed from 2018. Advanced multilevel facet arthrosis. Severe left greater than right neural foraminal stenosis at C6-7 due to uncovertebral spurring. Upper chest: Reported separately on contemporaneous chest CT. Other: None. IMPRESSION: 1. No evidence of acute intracranial abnormality. 2. Right forehead scalp laceration and hematoma. 3. No acute maxillofacial or cervical spine fracture. 4. 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 Electronically Signed   By: Sebastian Ache M.D.   On: 06/25/2023 17:08   CT Maxillofacial Wo Contrast  Result Date: 06/25/2023 CLINICAL  DATA:  Head trauma, moderate-severe; Facial trauma, blunt; Polytrauma, blunt. Fall. EXAM: CT HEAD WITHOUT CONTRAST CT MAXILLOFACIAL WITHOUT CONTRAST CT CERVICAL SPINE WITHOUT CONTRAST TECHNIQUE: Multidetector CT imaging of the head, cervical spine, and maxillofacial structures were performed using the standard protocol without intravenous contrast. Multiplanar CT image reconstructions of the cervical spine and maxillofacial structures were also generated. RADIATION DOSE REDUCTION: This exam was performed according to the departmental dose-optimization program which includes automated exposure control, adjustment of the mA and/or kV according to patient size and/or use of iterative reconstruction technique. COMPARISON:  Head CT 05/28/2023 and MRI 05/17/2019. Cervical spine CT 11/17/2017. FINDINGS: CT HEAD FINDINGS Brain: There is no evidence of an acute infarct, intracranial hemorrhage, mass, midline shift, or extra-axial fluid collection. Confluent hypodensities in the cerebral white matter bilaterally are unchanged and nonspecific but compatible with severe chronic small vessel ischemic disease. A small to moderate-sized chronic left parieto-occipital infarct is again noted as well as chronic infarcts in the thalami and cerebellum bilaterally. There is moderate cerebral atrophy. Vascular: Calcified atherosclerosis at the skull base. No hyperdense vessel. Skull: No acute fracture or suspicious osseous lesion. Other: None. CT MAXILLOFACIAL FINDINGS Osseous: No acute fracture, suspicious osseous lesion, or mandibular dislocation. Orbits: Bilateral cataract extraction.  No intraorbital hematoma. Sinuses: Paranasal sinuses and mastoid air cells are clear. Soft tissues: Right lateral forehead scalp laceration and small hematoma. CT CERVICAL SPINE FINDINGS Alignment: Unchanged trace anterolisthesis of C5 on C6. Skull base and vertebrae: No acute fracture or suspicious osseous lesion is identified within limitations of  mild motion artifact. Unchanged ankylosis of the C3-4 vertebral bodies and left-sided posterior elements. Incomplete fusion of the posterior arch of C1. Soft tissues and spinal canal: No prevertebral fluid or swelling. No visible canal hematoma. Disc levels: Multilevel disc degeneration, most advanced at C6-7 and mildly progressed from 2018. Advanced multilevel facet arthrosis. Severe left greater than right neural foraminal stenosis at C6-7 due to uncovertebral spurring. Upper chest: Reported separately on contemporaneous chest CT. Other: None. IMPRESSION: 1. No evidence of acute intracranial abnormality. 2. Right forehead scalp laceration and hematoma. 3. No acute maxillofacial or cervical spine fracture. 4. 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 Electronically Signed   By: Sebastian Ache M.D.   On: 06/25/2023 17:08   DG Pelvis Portable  Result Date: 06/25/2023 CLINICAL DATA:  Trauma EXAM: PORTABLE PELVIS 1-2 VIEWS COMPARISON:  01/10/2018 FINDINGS: There is no evidence of pelvic fracture or diastasis. No pelvic bone  lesions are seen. Iliac wings are partially excluded. IMPRESSION: Negative. Electronically Signed   By: Corlis Leak M.D.   On: 06/25/2023 16:39   DG Chest Port 1 View  Result Date: 06/25/2023 CLINICAL DATA:  Trauma EXAM: PORTABLE CHEST - 1 VIEW COMPARISON:  05/28/2023 FINDINGS: Relatively low lung volumes, with no definite infiltrate. No pneumothorax. Heart size upper limits normal. Normal mediastinal contour. Aortic Atherosclerosis (ICD10-170.0). No effusion. Left shoulder DJD. IMPRESSION: Low lung volumes. No acute findings. Electronically Signed   By: Corlis Leak M.D.   On: 06/25/2023 16:38    Pending Labs Unresulted Labs (From admission, onward)     Start     Ordered   06/25/23 1725  SARS Coronavirus 2 by RT PCR (hospital order, performed in [facility] hospital lab) *cepheid single result test* Anterior Nasal Swab  (Tier 2 - SARS Coronavirus 2 by RT  PCR (hospital order, performed in [facility] hospital lab) *cepheid single result test*)  Once,   URGENT        06/25/23 1724   06/25/23 1608  Urinalysis, Routine w reflex microscopic -Urine, Clean Catch  (Trauma Panel)  Once,   URGENT       Question:  Specimen Source  Answer:  Urine, Clean Catch   06/25/23 1608            Vitals/Pain Today's Vitals   06/25/23 1857 06/25/23 1900 06/25/23 1915 06/25/23 1930  BP:  (!) 107/46 (!) 130/48 (!) 116/54  Pulse:  84 87 83  Resp:  (!) 26 (!) 22 (!) 25  Temp: 98.2 F (36.8 C)     TempSrc: Oral     SpO2:  96% 97% 96%  Weight:    83.9 kg  Height:    5\' 2"  (1.575 m)    Isolation Precautions No active isolations  Medications Medications  ondansetron (ZOFRAN) injection 4 mg (4 mg Intravenous Given 06/25/23 1616)  lactated ringers bolus 1,000 mL (0 mLs Intravenous Stopped 06/25/23 1806)  Tdap (BOOSTRIX) injection 0.5 mL (0.5 mLs Intramuscular Given 06/25/23 1755)  iohexol (OMNIPAQUE) 350 MG/ML injection 75 mL (75 mLs Intravenous Contrast Given 06/25/23 1651)  lidocaine-EPINEPHrine (XYLOCAINE W/EPI) 2 %-1:200000 (PF) injection 10 mL (10 mLs Infiltration Given 06/25/23 1805)    Mobility unknown     Focused Assessments Skin and wound care   R Recommendations: See Admitting Provider Note  Report given to:   Additional Notes: Alzheimers patient fell [facility] of bed at [facility]F a/o x 1 follows commands large laceration closed with sutures above right eye bruising hematoma present vs WNL DNR

## 2023-06-25 NOTE — ED Provider Notes (Addendum)
[place] EMERGENCY DEPARTMENT AT [facility] Provider Note   CSN: [id number] Arrival date & time: 06/25/23  1605     History  Chief Complaint  Patient presents with   Trauma    Level-1, fall    ELLISHA [name] is a 82 y.o. female.  HPI   82 year old female with medical history significant for HTN, HLD, GERD, anxiety, presenting to the emergency department from her [facility] skilled nursing facility after a fall.  The patient presents as a level 2 trauma.  She reportedly tried to get out of bed and fell hitting her head sustaining a laceration to her right forehead and cheek.  She was notably having nausea and vomiting with EMS and was intermittently hypotensive.  She was made a level 2 trauma prior to arrival.  On arrival, the patient was found to be hypotensive blood pressure 86/40 and she was subsequently escalated to a level 1 trauma.  She arrives GCS 14, ABC intact.  Home Medications Prior to Admission medications   Medication Sig Start Date End Date Taking? Authorizing Provider  ALPRAZolam Prudy Feeler) 0.5 MG tablet Take 0.5 mg by mouth at bedtime as needed.      [provider]  aspirin EC 81 MG tablet Take 81 mg by mouth daily.      [provider]  atenolol (TENORMIN) 50 MG tablet Take 50 mg by mouth daily.      [provider]  atorvastatin (LIPITOR) 20 MG tablet Take 20 mg by mouth daily.      [provider]  ergocalciferol (VITAMIN D2) 50000 UNITS capsule Take 50,000 Units by mouth once a week. On Monday     [provider]  estrogen-methylTESTOSTERone (ESTRATEST) 1.25-2.5 MG per tablet Take 1 tablet by mouth daily.      [provider]  fexofenadine (ALLEGRA) 180 MG tablet Take 180 mg by mouth daily.      [provider]  FLUoxetine (PROZAC) 20 MG capsule Take 20 mg by mouth daily.      [provider]  hydrochlorothiazide (HYDRODIURIL) 25 MG tablet Take 25 mg by mouth daily.      [provider]  nitrofurantoin, macrocrystal-monohydrate, (MACROBID) 100 MG capsule Take 100 mg by mouth at bedtime.      [provider]  omeprazole (PRILOSEC) 20 MG capsule Take 20 mg by mouth daily.      [provider]  vitamin B-12 (CYANOCOBALAMIN) 1000 MCG tablet Take 1,000 mcg by mouth daily.      [provider]      Allergies    Codeine, Darvocet [propoxyphene n-acetaminophen], Hydrocodone, Morphine, Pravachol, Sulfa antibiotics, and Zocor [simvastatin - high dose]    Review of Systems   Review of Systems  All other systems reviewed and are negative.   Physical Exam Updated Vital Signs BP (!) 119/44   Pulse 86   Temp 98.2 F (36.8 C) (Oral)   Resp (!) 23   SpO2 97%  Physical Exam Vitals and nursing note reviewed.  Constitutional:      General: She is not in acute distress.    Appearance: She is well-developed.     Comments: GCS 14, ABC intact, pt actively retching  HENT:     Head: Normocephalic.     Comments: Large 4-5cm right forehead laceration, hemostatic, 1cm right cheek laceration Eyes:     Extraocular Movements: Extraocular movements intact.     Conjunctiva/sclera: Conjunctivae normal.     Pupils: Pupils are  equal, round, and reactive to light.  Neck:     Comments: No midline tenderness to palpation of the cervical spine.  C-collar placed on arrival Cardiovascular:     Rate and Rhythm: Normal rate and regular rhythm.  Pulmonary:     Effort: Pulmonary effort is normal. No respiratory distress.     Breath sounds: Normal breath sounds.  Chest:     Comments: Clavicles stable nontender to AP compression.  Chest wall stable and nontender to AP and lateral compression. Abdominal:     Palpations: Abdomen is soft.     Tenderness: There is no abdominal tenderness.     Comments: Pelvis stable to lateral compression  Musculoskeletal:     Cervical back: Neck supple.     Comments: No midline tenderness to palpation of the thoracic or  lumbar spine.  Extremities atraumatic with intact range of motion  Skin:    General: Skin is warm and dry.  Neurological:     Mental Status: She is alert.     Comments: Cranial nerves II through XII grossly intact.  Moving all 4 extremities spontaneously.  Sensation grossly intact all 4 extremities     ED Results / Procedures / Treatments   Labs (all labs ordered are listed, but only abnormal results are displayed) Labs Reviewed  COMPREHENSIVE METABOLIC PANEL - Abnormal; Notable for the following components:      Result Value   Chloride 113 (*)    Glucose, Bld 144 (*)    BUN 29 (*)    Creatinine, Ser 1.63 (*)    Calcium 8.6 (*)    Total Protein 5.5 (*)    Albumin 2.9 (*)    AST 205 (*)    ALT 87 (*)    Alkaline Phosphatase 266 (*)    Total Bilirubin 1.6 (*)    GFR, Estimated 31 (*)    All other components within normal limits  I-STAT CHEM 8, ED - Abnormal; Notable for the following components:   Chloride 113 (*)    BUN 29 (*)    Creatinine, Ser 1.70 (*)    Glucose, Bld 140 (*)    Hemoglobin 11.9 (*)    HCT 35.0 (*)    All other components within normal limits  SARS CORONAVIRUS 2 BY RT PCR  CBC  ETHANOL  LACTIC ACID, PLASMA  PROTIME-INR  URINALYSIS, ROUTINE W REFLEX MICROSCOPIC  SAMPLE TO BLOOD BANK  TROPONIN I (HIGH SENSITIVITY)    EKG None  Radiology CT CHEST ABDOMEN PELVIS W CONTRAST  Result Date: 06/25/2023 CLINICAL DATA:  Polytrauma.  Fall, multiple injuries. EXAM: CT CHEST, ABDOMEN, AND PELVIS WITH CONTRAST TECHNIQUE: Multidetector CT imaging of the chest, abdomen and pelvis was performed following the standard protocol during bolus administration of intravenous contrast. RADIATION DOSE REDUCTION: This exam was performed according to the departmental dose-optimization program which includes automated exposure control, adjustment of the mA and/or kV according to patient size and/or use of iterative reconstruction technique. CONTRAST:  75mL OMNIPAQUE IOHEXOL 350  MG/ML SOLN COMPARISON:  CT abdomen pelvis 03/20/2023, CT chest 01/04/2018 FINDINGS: CT CHEST FINDINGS Cardiovascular: Mitral annular calcifications. Normal heart size. No pericardial effusion. Mediastinum/Nodes: Heterogeneous appearance of the thyroid with multiple hypodense nodules measuring up to 0.5 cm, not significantly changed compared to 11/17/2017 (series 3, image 2). No enlarged mediastinal, hilar, or axillary lymph nodes. The esophagus and trachea demonstrate no significant abnormality. Lungs/Pleura: Subsegmental dependent atelectasis. No other focal consolidation no pleural effusion or pneumothorax. Musculoskeletal: No chest wall mass or  suspicious bone lesions identified. Old fractures of the left third through fifth ribs. Compression fracture of the T12 vertebral body is unchanged compared to 03/20/2023. No acute osseous finding. CT ABDOMEN PELVIS FINDINGS Hepatobiliary: No focal liver abnormality is seen. Status post cholecystectomy. Mild intrahepatic dilatation. Extrahepatic bile duct is also dilated measuring 1.1 cm diameter (series 6, image 45). A 1.2 cm oval density is seen within the extrahepatic bile duct, similar to 03/20/2023 (series 3, image 59; series 6, image 45. The second density seen at the level of the ampulla on prior exam is not appreciated on today's exam. Pancreas: Unremarkable. No pancreatic ductal dilatation or surrounding inflammatory changes. Spleen: Normal in size. Adrenals/Urinary Tract: Adrenal glands are unremarkable. Atrophic kidneys with multifocal cortical scarring. Multiple bilateral fluid density cortical cysts. No follow-up imaging is indicated. No suspicious mass or hydronephrosis. Urinary bladder is unremarkable. Stomach/Bowel: Small hiatal hernia. Stomach is otherwise within normal limits. Appendix is not directly visualized, however there are no pericecal inflammatory changes. Diverticulosis of the sigmoid colon. No evidence of bowel wall thickening, distention, or  inflammatory changes. Vascular/Lymphatic: Aortic atherosclerosis. No enlarged abdominal or pelvic lymph nodes. Reproductive: Uterus and bilateral adnexa are unremarkable. Other: No abdominal wall hernia or abnormality. No abdominopelvic ascites. Musculoskeletal: No acute or suspicious osseous findings. Bilateral pedicle screw and rod fixation spans L3-L4 with intervertebral spacer. Spinal instrumentation is intact. Stable grade 1 anterolisthesis of L4 on L5. IMPRESSION: 1. No evidence of acute traumatic injury in the chest, abdomen, or pelvis. 2. Status post cholecystectomy with mild intrahepatic and extrahepatic bile duct dilatation. A 1.2 cm oval density is seen within the extrahepatic bile duct, similar to 03/20/2023 and suggestive of choledocholithiasis. The second density noted at the level of the ampulla on prior exam is not appreciated on today's exam. Consider further evaluation with ERCP or MRCP. 3. Diverticulosis of the sigmoid colon without findings of diverticulitis. 4. Heterogeneous appearance of the thyroid with multiple hypodense nodules measuring up to 0.5 cm, not significantly changed compared to 11/17/2017. No follow-up imaging is indicated. 5. Aortic atherosclerosis. Aortic Atherosclerosis (ICD10-I70.0). Electronically Signed   By: Sherron Ales M.D.   On: 06/25/2023 17:26   CT HEAD WO CONTRAST  Result Date: 06/25/2023 CLINICAL DATA:  Head trauma, moderate-severe; Facial trauma, blunt; Polytrauma, blunt. Fall. EXAM: CT HEAD WITHOUT CONTRAST CT MAXILLOFACIAL WITHOUT CONTRAST CT CERVICAL SPINE WITHOUT CONTRAST TECHNIQUE: Multidetector CT imaging of the head, cervical spine, and maxillofacial structures were performed using the standard protocol without intravenous contrast. Multiplanar CT image reconstructions of the cervical spine and maxillofacial structures were also generated. RADIATION DOSE REDUCTION: This exam was performed according to the departmental dose-optimization program which  includes automated exposure control, adjustment of the mA and/or kV according to patient size and/or use of iterative reconstruction technique. COMPARISON:  Head CT 05/28/2023 and MRI 05/17/2019. Cervical spine CT 11/17/2017. FINDINGS: CT HEAD FINDINGS Brain: There is no evidence of an acute infarct, intracranial hemorrhage, mass, midline shift, or extra-axial fluid collection. Confluent hypodensities in the cerebral white matter bilaterally are unchanged and nonspecific but compatible with severe chronic small vessel ischemic disease. A small to moderate-sized chronic left parieto-occipital infarct is again noted as well as chronic infarcts in the thalami and cerebellum bilaterally. There is moderate cerebral atrophy. Vascular: Calcified atherosclerosis at the skull base. No hyperdense vessel. Skull: No acute fracture or suspicious osseous lesion. Other: None. CT MAXILLOFACIAL FINDINGS Osseous: No acute fracture, suspicious osseous lesion, or mandibular dislocation. Orbits: Bilateral cataract extraction.  No intraorbital hematoma. Sinuses:  Paranasal sinuses and mastoid air cells are clear. Soft tissues: Right lateral forehead scalp laceration and small hematoma. CT CERVICAL SPINE FINDINGS Alignment: Unchanged trace anterolisthesis of C5 on C6. Skull base and vertebrae: No acute fracture or suspicious osseous lesion is identified within limitations of mild motion artifact. Unchanged ankylosis of the C3-4 vertebral bodies and left-sided posterior elements. Incomplete fusion of the posterior arch of C1. Soft tissues and spinal canal: No prevertebral fluid or swelling. No visible canal hematoma. Disc levels: Multilevel disc degeneration, most advanced at C6-7 and mildly progressed from 2018. Advanced multilevel facet arthrosis. Severe left greater than right neural foraminal stenosis at C6-7 due to uncovertebral spurring. Upper chest: Reported separately on contemporaneous chest CT. Other: None. IMPRESSION: 1. No  evidence of acute intracranial abnormality. 2. Right forehead scalp laceration and hematoma. 3. No acute maxillofacial or cervical spine fracture. 4. 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 Electronically Signed   By: Sebastian Ache M.D.   On: 06/25/2023 17:08   CT CERVICAL SPINE WO CONTRAST  Result Date: 06/25/2023 CLINICAL DATA:  Head trauma, moderate-severe; Facial trauma, blunt; Polytrauma, blunt. Fall. EXAM: CT HEAD WITHOUT CONTRAST CT MAXILLOFACIAL WITHOUT CONTRAST CT CERVICAL SPINE WITHOUT CONTRAST TECHNIQUE: Multidetector CT imaging of the head, cervical spine, and maxillofacial structures were performed using the standard protocol without intravenous contrast. Multiplanar CT image reconstructions of the cervical spine and maxillofacial structures were also generated. RADIATION DOSE REDUCTION: This exam was performed according to the departmental dose-optimization program which includes automated exposure control, adjustment of the mA and/or kV according to patient size and/or use of iterative reconstruction technique. COMPARISON:  Head CT 05/28/2023 and MRI 05/17/2019. Cervical spine CT 11/17/2017. FINDINGS: CT HEAD FINDINGS Brain: There is no evidence of an acute infarct, intracranial hemorrhage, mass, midline shift, or extra-axial fluid collection. Confluent hypodensities in the cerebral white matter bilaterally are unchanged and nonspecific but compatible with severe chronic small vessel ischemic disease. A small to moderate-sized chronic left parieto-occipital infarct is again noted as well as chronic infarcts in the thalami and cerebellum bilaterally. There is moderate cerebral atrophy. Vascular: Calcified atherosclerosis at the skull base. No hyperdense vessel. Skull: No acute fracture or suspicious osseous lesion. Other: None. CT MAXILLOFACIAL FINDINGS Osseous: No acute fracture, suspicious osseous lesion, or mandibular dislocation. Orbits: Bilateral cataract  extraction.  No intraorbital hematoma. Sinuses: Paranasal sinuses and mastoid air cells are clear. Soft tissues: Right lateral forehead scalp laceration and small hematoma. CT CERVICAL SPINE FINDINGS Alignment: Unchanged trace anterolisthesis of C5 on C6. Skull base and vertebrae: No acute fracture or suspicious osseous lesion is identified within limitations of mild motion artifact. Unchanged ankylosis of the C3-4 vertebral bodies and left-sided posterior elements. Incomplete fusion of the posterior arch of C1. Soft tissues and spinal canal: No prevertebral fluid or swelling. No visible canal hematoma. Disc levels: Multilevel disc degeneration, most advanced at C6-7 and mildly progressed from 2018. Advanced multilevel facet arthrosis. Severe left greater than right neural foraminal stenosis at C6-7 due to uncovertebral spurring. Upper chest: Reported separately on contemporaneous chest CT. Other: None. IMPRESSION: 1. No evidence of acute intracranial abnormality. 2. Right forehead scalp laceration and hematoma. 3. No acute maxillofacial or cervical spine fracture. 4. 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 Electronically Signed   By: Sebastian Ache M.D.   On: 06/25/2023 17:08   CT Maxillofacial Wo Contrast  Result Date: 06/25/2023 CLINICAL DATA:  Head trauma, moderate-severe; Facial trauma, blunt; Polytrauma, blunt. Fall. EXAM: CT  HEAD WITHOUT CONTRAST CT MAXILLOFACIAL WITHOUT CONTRAST CT CERVICAL SPINE WITHOUT CONTRAST TECHNIQUE: Multidetector CT imaging of the head, cervical spine, and maxillofacial structures were performed using the standard protocol without intravenous contrast. Multiplanar CT image reconstructions of the cervical spine and maxillofacial structures were also generated. RADIATION DOSE REDUCTION: This exam was performed according to the departmental dose-optimization program which includes automated exposure control, adjustment of the mA and/or kV according  to patient size and/or use of iterative reconstruction technique. COMPARISON:  Head CT 05/28/2023 and MRI 05/17/2019. Cervical spine CT 11/17/2017. FINDINGS: CT HEAD FINDINGS Brain: There is no evidence of an acute infarct, intracranial hemorrhage, mass, midline shift, or extra-axial fluid collection. Confluent hypodensities in the cerebral white matter bilaterally are unchanged and nonspecific but compatible with severe chronic small vessel ischemic disease. A small to moderate-sized chronic left parieto-occipital infarct is again noted as well as chronic infarcts in the thalami and cerebellum bilaterally. There is moderate cerebral atrophy. Vascular: Calcified atherosclerosis at the skull base. No hyperdense vessel. Skull: No acute fracture or suspicious osseous lesion. Other: None. CT MAXILLOFACIAL FINDINGS Osseous: No acute fracture, suspicious osseous lesion, or mandibular dislocation. Orbits: Bilateral cataract extraction.  No intraorbital hematoma. Sinuses: Paranasal sinuses and mastoid air cells are clear. Soft tissues: Right lateral forehead scalp laceration and small hematoma. CT CERVICAL SPINE FINDINGS Alignment: Unchanged trace anterolisthesis of C5 on C6. Skull base and vertebrae: No acute fracture or suspicious osseous lesion is identified within limitations of mild motion artifact. Unchanged ankylosis of the C3-4 vertebral bodies and left-sided posterior elements. Incomplete fusion of the posterior arch of C1. Soft tissues and spinal canal: No prevertebral fluid or swelling. No visible canal hematoma. Disc levels: Multilevel disc degeneration, most advanced at C6-7 and mildly progressed from 2018. Advanced multilevel facet arthrosis. Severe left greater than right neural foraminal stenosis at C6-7 due to uncovertebral spurring. Upper chest: Reported separately on contemporaneous chest CT. Other: None. IMPRESSION: 1. No evidence of acute intracranial abnormality. 2. Right forehead scalp laceration and  hematoma. 3. No acute maxillofacial or cervical spine fracture. 4. 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 Electronically Signed   By: Sebastian Ache M.D.   On: 06/25/2023 17:08   DG Pelvis Portable  Result Date: 06/25/2023 CLINICAL DATA:  Trauma EXAM: PORTABLE PELVIS 1-2 VIEWS COMPARISON:  01/10/2018 FINDINGS: There is no evidence of pelvic fracture or diastasis. No pelvic bone lesions are seen. Iliac wings are partially excluded. IMPRESSION: Negative. Electronically Signed   By: Corlis Leak M.D.   On: 06/25/2023 16:39   DG Chest Port 1 View  Result Date: 06/25/2023 CLINICAL DATA:  Trauma EXAM: PORTABLE CHEST - 1 VIEW COMPARISON:  05/28/2023 FINDINGS: Relatively low lung volumes, with no definite infiltrate. No pneumothorax. Heart size upper limits normal. Normal mediastinal contour. Aortic Atherosclerosis (ICD10-170.0). No effusion. Left shoulder DJD. IMPRESSION: Low lung volumes. No acute findings. Electronically Signed   By: Corlis Leak M.D.   On: 06/25/2023 16:38    Procedures .Marland KitchenLaceration Repair  Date/Time: 06/25/2023 7:26 PM  Performed by: Ernie Avena, MD Authorized by: Ernie Avena, MD   Consent:    Consent obtained:  Emergent situation Laceration details:    Location:  Face   Face location:  R cheek   Length (cm):  1 Exploration:    Wound exploration: wound explored through full range of motion   Treatment:    Area cleansed with:  Saline   Amount of cleaning:  Standard Skin repair:    Repair  method:  Sutures   Suture size:  5-0   Suture material:  Prolene   Suture technique:  Simple interrupted   Number of sutures:  2 Approximation:    Approximation:  Close Repair type:    Repair type:  Simple Post-procedure details:    Dressing:  Open (no dressing)   Procedure completion:  Tolerated .Marland KitchenLaceration Repair  Date/Time: 06/25/2023 7:26 PM  Performed by: Ernie Avena, MD Authorized by: Ernie Avena, MD   Consent:    Consent  obtained:  Emergent situation Laceration details:    Location:  Face   Face location:  Forehead   Length (cm):  5 Treatment:    Area cleansed with:  Saline   Amount of cleaning:  Standard   Irrigation solution:  Sterile saline Skin repair:    Repair method:  Sutures   Suture size:  5-0   Suture material:  Prolene   Suture technique:  Simple interrupted   Number of sutures:  6 Approximation:    Approximation:  Close Repair type:    Repair type:  Simple Post-procedure details:    Dressing:  Open (no dressing)   Procedure completion:  Tolerated .Critical Care  Performed by: Ernie Avena, MD Authorized by: Ernie Avena, MD   Critical care provider statement:    Critical care time (minutes):  30   Critical care was necessary to treat or prevent imminent or life-threatening deterioration of the following conditions:  Trauma   Critical care was time spent personally by me on the following activities:  Development of treatment plan with patient or surrogate, discussions with consultants, evaluation of patient's response to treatment, examination of patient, ordering and review of laboratory studies, ordering and review of radiographic studies, ordering and performing treatments and interventions, pulse oximetry, re-evaluation of patient's condition and review of old charts   Care discussed with: admitting provider       Medications Ordered in ED Medications  ondansetron (ZOFRAN) injection 4 mg (4 mg Intravenous Given 06/25/23 1616)  lactated ringers bolus 1,000 mL (0 mLs Intravenous Stopped 06/25/23 1806)  Tdap (BOOSTRIX) injection 0.5 mL (0.5 mLs Intramuscular Given 06/25/23 1755)  iohexol (OMNIPAQUE) 350 MG/ML injection 75 mL (75 mLs Intravenous Contrast Given 06/25/23 1651)  lidocaine-EPINEPHrine (XYLOCAINE W/EPI) 2 %-1:200000 (PF) injection 10 mL (10 mLs Infiltration Given 06/25/23 1805)    ED Course/ Medical Decision Making/ A&P                             Medical Decision  Making Amount and/or Complexity of Data Reviewed Labs: ordered. Radiology: ordered.  Risk Prescription drug management. Decision regarding hospitalization.    82 year old female with medical history significant for HTN, HLD, GERD, anxiety, presenting to the emergency department from her [facility] skilled nursing facility after a fall.  The patient presents as a level 2 trauma.  She reportedly tried to get out of bed and fell hitting her head sustaining a laceration to her right forehead and cheek.  She was notably having nausea and vomiting with EMS and was intermittently hypotensive.  She was made a level 2 trauma prior to arrival.  On arrival, the patient was found to be hypotensive blood pressure 86/40 and she was subsequently escalated to a level 1 trauma.  She arrives GCS 14, ABC intact.  On arrival, the patient was hypotensive BP 86/40, tachycardic heart rate 106, initial O2 sat 71%, subsequently 96% on room air. Currently, she is awake, alert, and  protecting her own airway and is hemodynamically stable.  Trauma imaging revealed (full reports in EMR): Portable CXR:  No evidence of pneumothorax or tracheal deviation Portable Pelvis:  No evidence of acute hip fracture or malalignment CT scans (pan-scan):  CT Head, Face and Cervical Spine: IMPRESSION:  1. No evidence of acute intracranial abnormality.  2. Right forehead scalp laceration and hematoma.  3. No acute maxillofacial or cervical spine fracture.  4. Severe chronic small vessel ischemic disease with multiple  chronic infarcts.   CT Chest Abdomen Pelvis: IMPRESSION:  1. No evidence of acute traumatic injury in the chest, abdomen, or  pelvis.  2. Status post cholecystectomy with mild intrahepatic and  extrahepatic bile duct dilatation. A 1.2 cm oval density is seen  within the extrahepatic bile duct, similar to 03/20/2023 and  suggestive of choledocholithiasis. The second density noted at the  level of the ampulla on prior  exam is not appreciated on today's  exam. Consider further evaluation with ERCP or MRCP.  3. Diverticulosis of the sigmoid colon without findings of  diverticulitis.  4. Heterogeneous appearance of the thyroid with multiple hypodense  nodules measuring up to 0.5 cm, not significantly changed compared  to 11/17/2017. No follow-up imaging is indicated.  5. Aortic atherosclerosis.    Labs: CBC without a leukocytosis or anemia, initial troponin 12, PT/INR normal, CMP with mild hyperglycemia to 144, evidence of an AKI with a serum creatinine of 1.63 and an elevated BUN to 29, elevated LFTs with an AST of 205, ALT of 87, alkaline phosphatase elevated to 266, T. bili elevated to 1.6.  The patient received Zofran and tdap and a 1L LR bolus while in the ED.  Trauma signed off on the patient. The patient's lacerations were cleaned bedside and repaired as per the procedure note above. GI (Dr. Rhea Belton) was consulted due to concern for choledocholithiasis.  With the additional presentation of hypotension, did consider ascending cholangitis.  Medicine consulted for admission.  I spoke with the patient's son over the phone who is the patient's healthcare POA. He consents to admission for ABX, rehydration, and GI consultation for potential ERCP in the AM. Internal medicine accepted the patient.     Final Clinical Impression(s) / ED Diagnoses Final diagnoses:  Fall, initial encounter  Facial laceration, initial encounter  Choledocholithiasis  Hypotension, unspecified hypotension type  Chronic kidney disease, unspecified CKD stage    Rx / DC Orders ED Discharge Orders     None         Ernie Avena, MD 06/25/23 Terence Lux      Ernie Avena, MD 06/25/23 2109

## 2023-06-25 NOTE — ED Notes (Signed)
Trauma Response Nurse Documentation   [name] [name] is a 82 y.o. female arriving to [facility] ED via EMS  On No antithrombotic. Trauma was activated as a Level 2 by ED Charge RN based on the following trauma criteria GCS 10-14 associated with trauma or AVPU < A. Pt was upgraded to a L1 by Dr Karene Fry due to her systolic BP being 86/42. Patient cleared for CT by Dr. Cliffton Asters. Pt transported to CT with trauma response nurse present to monitor. RN remained with the patient throughout their absence from the department for clinical observation.   GCS 14.  History   Past Medical History:  Diagnosis Date   Anxiety    GERD (gastroesophageal reflux disease)    Hypercholesteremia    Hypertension      Past Surgical History:  Procedure Laterality Date   BACK SURGERY     NECK FUSION       Initial Focused Assessment (If applicable, or please see trauma documentation): - Airway intact - Bilateral expiratory wheezes noted; diminished in bilateral lung bases. - 6 cm lac to R forehead - bleeding controlled  - 1cm lac beside pt's R eye - bleeding controlled - Pt nauseous and vomiting on arrival to ED  CT's Completed:   CT Head, CT Maxillofacial, CT C-Spine, CT Chest w/ contrast, and CT abdomen/pelvis w/ contrast   Interventions:  - CXR - Pelvic XR - 18G to L AC - 20G to R AC via [place] - trauma labs - 1L warmed LR given - CT pan scan given - 4mg  zofran given  Plan for disposition:  Admission to floor - medical admission  Consults completed:  none at 1800.  Event Summary: Pt is from [facility] facility in [place].  BIB [facility] basic EMS.  Pt activated as L2 trauma due to LOC, head lac and more confusion/combativeness than baseline.  Pt's SBP in the 70's with EMS (not reported in encode).  Pt arrived with no IVs and no c-collar in place. IVs obtained and [place] c-collar was placed upon arrival to ED.  Pt oriented x2 and slightly irritated.   Bedside handoff with ED RN Melony Overly.     Janora Norlander  Trauma Response RN  Please call TRN at [phone] for further assistance.
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
## 2023-06-25 NOTE — Consult Note (Signed)
Activation and Reason: Level 2--> 1, fall  CC: Fall  Requesting Physician: Ernie Avena, MD  Primary Survey:  Airway: Intact, talking Breathing: bilateral bs Circulation: palpable pulses in all 4 ext Disability: GCS 15  HPI: [name] [name] is an 82 y.o. female with hx of HTN, HLD, GERD, dementia and prior strokes - presented after fall when going to bathroom at her facility - struck head on way down. Unknown LOC. No reported blood thinners. Arrived and found to be with BP 80s/40s with blood loss from forehead lac --> upgraded to level 1.  She complains of mild forehead pain but otherwise is without complaint. She is known to have history of strokes and potentially dementia, which do limit history taking.  Past Medical History:  Diagnosis Date   Anxiety    GERD (gastroesophageal reflux disease)    Hypercholesteremia    Hypertension     Past Surgical History:  Procedure Laterality Date   BACK SURGERY     NECK FUSION      History reviewed. No pertinent family history.  Social:  reports that she has never smoked. She does not have any smokeless tobacco history on file. She reports that she does not drink alcohol and does not use drugs.  Allergies:  Allergies  Allergen Reactions   Codeine     hallucinations   Darvocet [Propoxyphene N-Acetaminophen]    Hydrocodone    Morphine     hallucinations   Pravachol    Sulfa Antibiotics    Zocor [Simvastatin - High Dose]     Medications: I have reviewed the patient's current medications.  Results for orders placed or performed during the hospital encounter of 06/25/23 (from the past 48 hour(s))  Sample to Blood Bank     Status: None   Collection Time: 06/25/23  4:08 PM  Result Value Ref Range   Blood Bank Specimen SAMPLE AVAILABLE FOR TESTING    Sample Expiration      06/28/2023,2359 Performed at [facility], [address] [place], [place] [address]   Comprehensive metabolic panel     Status: Abnormal    Collection Time: 06/25/23  4:15 PM  Result Value Ref Range   Sodium 142 135 - 145 mmol/L   Potassium 3.9 3.5 - 5.1 mmol/L   Chloride 113 (H) 98 - 111 mmol/L   CO2 22 22 - 32 mmol/L   Glucose, Bld 144 (H) 70 - 99 mg/dL    Comment: Glucose reference range applies only to samples taken after fasting for at least 8 hours.   BUN 29 (H) 8 - 23 mg/dL   Creatinine, Ser 9.14 (H) 0.44 - 1.00 mg/dL   Calcium 8.6 (L) 8.9 - 10.3 mg/dL   Total Protein 5.5 (L) 6.5 - 8.1 g/dL   Albumin 2.9 (L) 3.5 - 5.0 g/dL   AST 782 (H) 15 - 41 U/L   ALT 87 (H) 0 - 44 U/L   Alkaline Phosphatase 266 (H) 38 - 126 U/L   Total Bilirubin 1.6 (H) 0.3 - 1.2 mg/dL   GFR, Estimated 31 (L) >60 mL/min    Comment: (NOTE) Calculated using the CKD-EPI Creatinine Equation (2021)    Anion gap 7 5 - 15    Comment: Performed at [facility], [address], [place], [place] [address]  CBC     Status: None   Collection Time: 06/25/23  4:15 PM  Result Value Ref Range   WBC 8.1 4.0 - 10.5 K/uL  RBC 4.36 3.87 - 5.11 MIL/uL   Hemoglobin 12.3 12.0 - 15.0 g/dL   HCT 04.5 40.9 - 81.1 %   MCV 87.2 80.0 - 100.0 fL   MCH 28.2 26.0 - 34.0 pg   MCHC 32.4 30.0 - 36.0 g/dL   RDW 91.4 78.2 - 95.6 %   Platelets 168 150 - 400 K/uL   nRBC 0.0 0.0 - 0.2 %    Comment: Performed at [facility], [address] [place], [place] [address]  Protime-INR     Status: None   Collection Time: 06/25/23  4:15 PM  Result Value Ref Range   Prothrombin Time 13.4 11.4 - 15.2 seconds   INR 1.0 0.8 - 1.2    Comment: (NOTE) INR goal varies based on device and disease states. Performed at [facility], [address] [place], [place] [address]   Troponin I (High Sensitivity)     Status: None   Collection Time: 06/25/23  4:15 PM  Result Value Ref Range   Troponin I (High Sensitivity) 12 <18 ng/L    Comment: (NOTE) Elevated high sensitivity troponin I (hsTnI) values and significant  changes across serial measurements may suggest ACS  but many other  chronic and acute conditions are known to elevate hsTnI results.  Refer to the "Links" section for chest pain algorithms and additional  guidance. Performed at [facility], [address] [place], [place] [address]   I-Stat Chem 8, ED     Status: Abnormal   Collection Time: 06/25/23  4:26 PM  Result Value Ref Range   Sodium 144 135 - 145 mmol/L   Potassium 3.9 3.5 - 5.1 mmol/L   Chloride 113 (H) 98 - 111 mmol/L   BUN 29 (H) 8 - 23 mg/dL   Creatinine, Ser 5.28 (H) 0.44 - 1.00 mg/dL   Glucose, Bld 413 (H) 70 - 99 mg/dL    Comment: Glucose reference range applies only to samples taken after fasting for at least 8 hours.   Calcium, Ion 1.25 1.15 - 1.40 mmol/L   TCO2 22 22 - 32 mmol/L   Hemoglobin 11.9 (L) 12.0 - 15.0 g/dL   HCT 24.4 (L) 01.0 - 27.2 %    CT CHEST ABDOMEN PELVIS W CONTRAST  Result Date: 06/25/2023 CLINICAL DATA:  Polytrauma.  Fall, multiple injuries. EXAM: CT CHEST, ABDOMEN, AND PELVIS WITH CONTRAST TECHNIQUE: Multidetector CT imaging of the chest, abdomen and pelvis was performed following the standard protocol during bolus administration of intravenous contrast. RADIATION DOSE REDUCTION: This exam was performed according to the departmental dose-optimization program which includes automated exposure control, adjustment of the mA and/or kV according to patient size and/or use of iterative reconstruction technique. CONTRAST:  75mL OMNIPAQUE IOHEXOL 350 MG/ML SOLN COMPARISON:  CT abdomen pelvis 03/20/2023, CT chest 01/04/2018 FINDINGS: CT CHEST FINDINGS Cardiovascular: Mitral annular calcifications. Normal heart size. No pericardial effusion. Mediastinum/Nodes: Heterogeneous appearance of the thyroid with multiple hypodense nodules measuring up to 0.5 cm, not significantly changed compared to 11/17/2017 (series 3, image 2). No enlarged mediastinal, hilar, or axillary lymph nodes. The esophagus and trachea demonstrate no significant abnormality. Lungs/Pleura:  Subsegmental dependent atelectasis. No other focal consolidation no pleural effusion or pneumothorax. Musculoskeletal: No chest wall mass or suspicious bone lesions identified. Old fractures of the left third through fifth ribs. Compression fracture of the T12 vertebral body is unchanged compared to 03/20/2023. No acute osseous finding. CT ABDOMEN PELVIS FINDINGS Hepatobiliary: No focal liver abnormality is seen. Status  post cholecystectomy. Mild intrahepatic dilatation. Extrahepatic bile duct is also dilated measuring 1.1 cm diameter (series 6, image 45). A 1.2 cm oval density is seen within the extrahepatic bile duct, similar to 03/20/2023 (series 3, image 59; series 6, image 45. The second density seen at the level of the ampulla on prior exam is not appreciated on today's exam. Pancreas: Unremarkable. No pancreatic ductal dilatation or surrounding inflammatory changes. Spleen: Normal in size. Adrenals/Urinary Tract: Adrenal glands are unremarkable. Atrophic kidneys with multifocal cortical scarring. Multiple bilateral fluid density cortical cysts. No follow-up imaging is indicated. No suspicious mass or hydronephrosis. Urinary bladder is unremarkable. Stomach/Bowel: Small hiatal hernia. Stomach is otherwise within normal limits. Appendix is not directly visualized, however there are no pericecal inflammatory changes. Diverticulosis of the sigmoid colon. No evidence of bowel wall thickening, distention, or inflammatory changes. Vascular/Lymphatic: Aortic atherosclerosis. No enlarged abdominal or pelvic lymph nodes. Reproductive: Uterus and bilateral adnexa are unremarkable. Other: No abdominal wall hernia or abnormality. No abdominopelvic ascites. Musculoskeletal: No acute or suspicious osseous findings. Bilateral pedicle screw and rod fixation spans L3-L4 with intervertebral spacer. Spinal instrumentation is intact. Stable grade 1 anterolisthesis of L4 on L5. IMPRESSION: 1. No evidence of acute traumatic injury  in the chest, abdomen, or pelvis. 2. Status post cholecystectomy with mild intrahepatic and extrahepatic bile duct dilatation. A 1.2 cm oval density is seen within the extrahepatic bile duct, similar to 03/20/2023 and suggestive of choledocholithiasis. The second density noted at the level of the ampulla on prior exam is not appreciated on today's exam. Consider further evaluation with ERCP or MRCP. 3. Diverticulosis of the sigmoid colon without findings of diverticulitis. 4. Heterogeneous appearance of the thyroid with multiple hypodense nodules measuring up to 0.5 cm, not significantly changed compared to 11/17/2017. No follow-up imaging is indicated. 5. Aortic atherosclerosis. Aortic Atherosclerosis (ICD10-I70.0). Electronically Signed   By: Sherron Ales M.D.   On: 06/25/2023 17:26   CT HEAD WO CONTRAST  Result Date: 06/25/2023 CLINICAL DATA:  Head trauma, moderate-severe; Facial trauma, blunt; Polytrauma, blunt. Fall. EXAM: CT HEAD WITHOUT CONTRAST CT MAXILLOFACIAL WITHOUT CONTRAST CT CERVICAL SPINE WITHOUT CONTRAST TECHNIQUE: Multidetector CT imaging of the head, cervical spine, and maxillofacial structures were performed using the standard protocol without intravenous contrast. Multiplanar CT image reconstructions of the cervical spine and maxillofacial structures were also generated. RADIATION DOSE REDUCTION: This exam was performed according to the departmental dose-optimization program which includes automated exposure control, adjustment of the mA and/or kV according to patient size and/or use of iterative reconstruction technique. COMPARISON:  Head CT 05/28/2023 and MRI 05/17/2019. Cervical spine CT 11/17/2017. FINDINGS: CT HEAD FINDINGS Brain: There is no evidence of an acute infarct, intracranial hemorrhage, mass, midline shift, or extra-axial fluid collection. Confluent hypodensities in the cerebral Cy[place] Shook matter bilaterally are unchanged and nonspecific but compatible with severe chronic small  vessel ischemic disease. A small to moderate-sized chronic left parieto-occipital infarct is again noted as well as chronic infarcts in the thalami and cerebellum bilaterally. There is moderate cerebral atrophy. Vascular: Calcified atherosclerosis at the skull base. No hyperdense vessel. Skull: No acute fracture or suspicious osseous lesion. Other: None. CT MAXILLOFACIAL FINDINGS Osseous: No acute fracture, suspicious osseous lesion, or mandibular dislocation. Orbits: Bilateral cataract extraction.  No intraorbital hematoma. Sinuses: Paranasal sinuses and mastoid air cells are clear. Soft tissues: Right lateral forehead scalp laceration and small hematoma. CT CERVICAL SPINE FINDINGS Alignment: Unchanged trace anterolisthesis of C5 on C6. Skull base and vertebrae: No acute fracture or suspicious osseous lesion  is identified within limitations of mild motion artifact. Unchanged ankylosis of the C3-4 vertebral bodies and left-sided posterior elements. Incomplete fusion of the posterior arch of C1. Soft tissues and spinal canal: No prevertebral fluid or swelling. No visible canal hematoma. Disc levels: Multilevel disc degeneration, most advanced at C6-7 and mildly progressed from 2018. Advanced multilevel facet arthrosis. Severe left greater than right neural foraminal stenosis at C6-7 due to uncovertebral spurring. Upper chest: Reported separately on contemporaneous chest CT. Other: None. IMPRESSION: 1. No evidence of acute intracranial abnormality. 2. Right forehead scalp laceration and hematoma. 3. No acute maxillofacial or cervical spine fracture. 4. 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 Electronically Signed   By: Sebastian Ache M.D.   On: 06/25/2023 17:08   CT CERVICAL SPINE WO CONTRAST  Result Date: 06/25/2023 CLINICAL DATA:  Head trauma, moderate-severe; Facial trauma, blunt; Polytrauma, blunt. Fall. EXAM: CT HEAD WITHOUT CONTRAST CT MAXILLOFACIAL WITHOUT CONTRAST CT  CERVICAL SPINE WITHOUT CONTRAST TECHNIQUE: Multidetector CT imaging of the head, cervical spine, and maxillofacial structures were performed using the standard protocol without intravenous contrast. Multiplanar CT image reconstructions of the cervical spine and maxillofacial structures were also generated. RADIATION DOSE REDUCTION: This exam was performed according to the departmental dose-optimization program which includes automated exposure control, adjustment of the mA and/or kV according to patient size and/or use of iterative reconstruction technique. COMPARISON:  Head CT 05/28/2023 and MRI 05/17/2019. Cervical spine CT 11/17/2017. FINDINGS: CT HEAD FINDINGS Brain: There is no evidence of an acute infarct, intracranial hemorrhage, mass, midline shift, or extra-axial fluid collection. Confluent hypodensities in the cerebral Sorina Derrig matter bilaterally are unchanged and nonspecific but compatible with severe chronic small vessel ischemic disease. A small to moderate-sized chronic left parieto-occipital infarct is again noted as well as chronic infarcts in the thalami and cerebellum bilaterally. There is moderate cerebral atrophy. Vascular: Calcified atherosclerosis at the skull base. No hyperdense vessel. Skull: No acute fracture or suspicious osseous lesion. Other: None. CT MAXILLOFACIAL FINDINGS Osseous: No acute fracture, suspicious osseous lesion, or mandibular dislocation. Orbits: Bilateral cataract extraction.  No intraorbital hematoma. Sinuses: Paranasal sinuses and mastoid air cells are clear. Soft tissues: Right lateral forehead scalp laceration and small hematoma. CT CERVICAL SPINE FINDINGS Alignment: Unchanged trace anterolisthesis of C5 on C6. Skull base and vertebrae: No acute fracture or suspicious osseous lesion is identified within limitations of mild motion artifact. Unchanged ankylosis of the C3-4 vertebral bodies and left-sided posterior elements. Incomplete fusion of the posterior arch of C1.  Soft tissues and spinal canal: No prevertebral fluid or swelling. No visible canal hematoma. Disc levels: Multilevel disc degeneration, most advanced at C6-7 and mildly progressed from 2018. Advanced multilevel facet arthrosis. Severe left greater than right neural foraminal stenosis at C6-7 due to uncovertebral spurring. Upper chest: Reported separately on contemporaneous chest CT. Other: None. IMPRESSION: 1. No evidence of acute intracranial abnormality. 2. Right forehead scalp laceration and hematoma. 3. No acute maxillofacial or cervical spine fracture. 4. 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 Electronically Signed   By: Sebastian Ache M.D.   On: 06/25/2023 17:08   CT Maxillofacial Wo Contrast  Result Date: 06/25/2023 CLINICAL DATA:  Head trauma, moderate-severe; Facial trauma, blunt; Polytrauma, blunt. Fall. EXAM: CT HEAD WITHOUT CONTRAST CT MAXILLOFACIAL WITHOUT CONTRAST CT CERVICAL SPINE WITHOUT CONTRAST TECHNIQUE: Multidetector CT imaging of the head, cervical spine, and maxillofacial structures were performed using the standard protocol without intravenous contrast. Multiplanar CT image reconstructions of the cervical spine  and maxillofacial structures were also generated. RADIATION DOSE REDUCTION: This exam was performed according to the departmental dose-optimization program which includes automated exposure control, adjustment of the mA and/or kV according to patient size and/or use of iterative reconstruction technique. COMPARISON:  Head CT 05/28/2023 and MRI 05/17/2019. Cervical spine CT 11/17/2017. FINDINGS: CT HEAD FINDINGS Brain: There is no evidence of an acute infarct, intracranial hemorrhage, mass, midline shift, or extra-axial fluid collection. Confluent hypodensities in the cerebral [place] matter bilaterally are unchanged and nonspecific but compatible with severe chronic small vessel ischemic disease. A small to moderate-sized chronic left  parieto-occipital infarct is again noted as well as chronic infarcts in the thalami and cerebellum bilaterally. There is moderate cerebral atrophy. Vascular: Calcified atherosclerosis at the skull base. No hyperdense vessel. Skull: No acute fracture or suspicious osseous lesion. Other: None. CT MAXILLOFACIAL FINDINGS Osseous: No acute fracture, suspicious osseous lesion, or mandibular dislocation. Orbits: Bilateral cataract extraction.  No intraorbital hematoma. Sinuses: Paranasal sinuses and mastoid air cells are clear. Soft tissues: Right lateral forehead scalp laceration and small hematoma. CT CERVICAL SPINE FINDINGS Alignment: Unchanged trace anterolisthesis of C5 on C6. Skull base and vertebrae: No acute fracture or suspicious osseous lesion is identified within limitations of mild motion artifact. Unchanged ankylosis of the C3-4 vertebral bodies and left-sided posterior elements. Incomplete fusion of the posterior arch of C1. Soft tissues and spinal canal: No prevertebral fluid or swelling. No visible canal hematoma. Disc levels: Multilevel disc degeneration, most advanced at C6-7 and mildly progressed from 2018. Advanced multilevel facet arthrosis. Severe left greater than right neural foraminal stenosis at C6-7 due to uncovertebral spurring. Upper chest: Reported separately on contemporaneous chest CT. Other: None. IMPRESSION: 1. No evidence of acute intracranial abnormality. 2. Right forehead scalp laceration and hematoma. 3. No acute maxillofacial or cervical spine fracture. 4. 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 Electronically Signed   By: Sebastian Ache M.D.   On: 06/25/2023 17:08   DG Pelvis Portable  Result Date: 06/25/2023 CLINICAL DATA:  Trauma EXAM: PORTABLE PELVIS 1-2 VIEWS COMPARISON:  01/10/2018 FINDINGS: There is no evidence of pelvic fracture or diastasis. No pelvic bone lesions are seen. Iliac wings are partially excluded. IMPRESSION: Negative.  Electronically Signed   By: Corlis Leak M.D.   On: 06/25/2023 16:39   DG Chest Port 1 View  Result Date: 06/25/2023 CLINICAL DATA:  Trauma EXAM: PORTABLE CHEST - 1 VIEW COMPARISON:  05/28/2023 FINDINGS: Relatively low lung volumes, with no definite infiltrate. No pneumothorax. Heart size upper limits normal. Normal mediastinal contour. Aortic Atherosclerosis (ICD10-170.0). No effusion. Left shoulder DJD. IMPRESSION: Low lung volumes. No acute findings. Electronically Signed   By: Corlis Leak M.D.   On: 06/25/2023 16:38    ROS -Unable to obtain due to condition of patient  PE Blood pressure 130/60, pulse 83, resp. rate (!) 24, SpO2 95 %. Physical Exam Constitutional: NAD; conversant; no obvious deformities; right forehead lacs, not actively bleeding Eyes: Moist conjunctiva; no lid lag; PERRL Neck: Trachea midline Lungs: Normal respiratory effort; bilateral breath sounds CV: RRR; no pitting edema GI: Abd soft, NT/ND MSK: Normal range of motion of extremities; no deformities or extremity ecchymoses; no back tenderness or bruising Psychiatric: Appropriate affect; alert and oriented to person  Results for orders placed or performed during the hospital encounter of 06/25/23 (from the past 48 hour(s))  Sample to Blood Bank     Status: None   Collection Time: 06/25/23  4:08 PM  Result Value Ref  Range   Blood Bank Specimen SAMPLE AVAILABLE FOR TESTING    Sample Expiration      06/28/2023,2359 Performed at [facility], [address] [place], [place] [address]   Comprehensive metabolic panel     Status: Abnormal   Collection Time: 06/25/23  4:15 PM  Result Value Ref Range   Sodium 142 135 - 145 mmol/L   Potassium 3.9 3.5 - 5.1 mmol/L   Chloride 113 (H) 98 - 111 mmol/L   CO2 22 22 - 32 mmol/L   Glucose, Bld 144 (H) 70 - 99 mg/dL    Comment: Glucose reference range applies only to samples taken after fasting for at least 8 hours.   BUN 29 (H) 8 - 23 mg/dL   Creatinine, Ser 2.95 (H)  0.44 - 1.00 mg/dL   Calcium 8.6 (L) 8.9 - 10.3 mg/dL   Total Protein 5.5 (L) 6.5 - 8.1 g/dL   Albumin 2.9 (L) 3.5 - 5.0 g/dL   AST 284 (H) 15 - 41 U/L   ALT 87 (H) 0 - 44 U/L   Alkaline Phosphatase 266 (H) 38 - 126 U/L   Total Bilirubin 1.6 (H) 0.3 - 1.2 mg/dL   GFR, Estimated 31 (L) >60 mL/min    Comment: (NOTE) Calculated using the CKD-EPI Creatinine Equation (2021)    Anion gap 7 5 - 15    Comment: Performed at [facility], [address] [place], [place] [address]  CBC     Status: None   Collection Time: 06/25/23  4:15 PM  Result Value Ref Range   WBC 8.1 4.0 - 10.5 K/uL   RBC 4.36 3.87 - 5.11 MIL/uL   Hemoglobin 12.3 12.0 - 15.0 g/dL   HCT 01.0 27.2 - 53.6 %   MCV 87.2 80.0 - 100.0 fL   MCH 28.2 26.0 - 34.0 pg   MCHC 32.4 30.0 - 36.0 g/dL   RDW 64.4 03.4 - 74.2 %   Platelets 168 150 - 400 K/uL   nRBC 0.0 0.0 - 0.2 %    Comment: Performed at [facility], [address] [place], [place] [address]  Protime-INR     Status: None   Collection Time: 06/25/23  4:15 PM  Result Value Ref Range   Prothrombin Time 13.4 11.4 - 15.2 seconds   INR 1.0 0.8 - 1.2    Comment: (NOTE) INR goal varies based on device and disease states. Performed at [facility], [address] [place], [place] [address]   Troponin I (High Sensitivity)     Status: None   Collection Time: 06/25/23  4:15 PM  Result Value Ref Range   Troponin I (High Sensitivity) 12 <18 ng/L    Comment: (NOTE) Elevated high sensitivity troponin I (hsTnI) values and significant  changes across serial measurements may suggest ACS but many other  chronic and acute conditions are known to elevate hsTnI results.  Refer to the "Links" section for chest pain algorithms and additional  guidance. Performed at [facility], [address], [place], [place] [address]   I-Stat Chem 8, ED     Status: Abnormal   Collection Time: 06/25/23  4:26 PM  Result Value Ref Range   Sodium 144 135 - 145 mmol/L    Potassium 3.9 3.5 - 5.1 mmol/L   Chloride 113 (H) 98 - 111 mmol/L   BUN 29 (H) 8 - 23 mg/dL   Creatinine, Ser 1.88 (H) 0.44 - 1.00  mg/dL   Glucose, Bld 161 (H) 70 - 99 mg/dL    Comment: Glucose reference range applies only to samples taken after fasting for at least 8 hours.   Calcium, Ion 1.25 1.15 - 1.40 mmol/L   TCO2 22 22 - 32 mmol/L   Hemoglobin 11.9 (L) 12.0 - 15.0 g/dL   HCT 09.6 (L) 04.5 - 40.9 %    CT CHEST ABDOMEN PELVIS W CONTRAST  Result Date: 06/25/2023 CLINICAL DATA:  Polytrauma.  Fall, multiple injuries. EXAM: CT CHEST, ABDOMEN, AND PELVIS WITH CONTRAST TECHNIQUE: Multidetector CT imaging of the chest, abdomen and pelvis was performed following the standard protocol during bolus administration of intravenous contrast. RADIATION DOSE REDUCTION: This exam was performed according to the departmental dose-optimization program which includes automated exposure control, adjustment of the mA and/or kV according to patient size and/or use of iterative reconstruction technique. CONTRAST:  75mL OMNIPAQUE IOHEXOL 350 MG/ML SOLN COMPARISON:  CT abdomen pelvis 03/20/2023, CT chest 01/04/2018 FINDINGS: CT CHEST FINDINGS Cardiovascular: Mitral annular calcifications. Normal heart size. No pericardial effusion. Mediastinum/Nodes: Heterogeneous appearance of the thyroid with multiple hypodense nodules measuring up to 0.5 cm, not significantly changed compared to 11/17/2017 (series 3, image 2). No enlarged mediastinal, hilar, or axillary lymph nodes. The esophagus and trachea demonstrate no significant abnormality. Lungs/Pleura: Subsegmental dependent atelectasis. No other focal consolidation no pleural effusion or pneumothorax. Musculoskeletal: No chest wall mass or suspicious bone lesions identified. Old fractures of the left third through fifth ribs. Compression fracture of the T12 vertebral body is unchanged compared to 03/20/2023. No acute osseous finding. CT ABDOMEN PELVIS FINDINGS Hepatobiliary:  No focal liver abnormality is seen. Status post cholecystectomy. Mild intrahepatic dilatation. Extrahepatic bile duct is also dilated measuring 1.1 cm diameter (series 6, image 45). A 1.2 cm oval density is seen within the extrahepatic bile duct, similar to 03/20/2023 (series 3, image 59; series 6, image 45. The second density seen at the level of the ampulla on prior exam is not appreciated on today's exam. Pancreas: Unremarkable. No pancreatic ductal dilatation or surrounding inflammatory changes. Spleen: Normal in size. Adrenals/Urinary Tract: Adrenal glands are unremarkable. Atrophic kidneys with multifocal cortical scarring. Multiple bilateral fluid density cortical cysts. No follow-up imaging is indicated. No suspicious mass or hydronephrosis. Urinary bladder is unremarkable. Stomach/Bowel: Small hiatal hernia. Stomach is otherwise within normal limits. Appendix is not directly visualized, however there are no pericecal inflammatory changes. Diverticulosis of the sigmoid colon. No evidence of bowel wall thickening, distention, or inflammatory changes. Vascular/Lymphatic: Aortic atherosclerosis. No enlarged abdominal or pelvic lymph nodes. Reproductive: Uterus and bilateral adnexa are unremarkable. Other: No abdominal wall hernia or abnormality. No abdominopelvic ascites. Musculoskeletal: No acute or suspicious osseous findings. Bilateral pedicle screw and rod fixation spans L3-L4 with intervertebral spacer. Spinal instrumentation is intact. Stable grade 1 anterolisthesis of L4 on L5. IMPRESSION: 1. No evidence of acute traumatic injury in the chest, abdomen, or pelvis. 2. Status post cholecystectomy with mild intrahepatic and extrahepatic bile duct dilatation. A 1.2 cm oval density is seen within the extrahepatic bile duct, similar to 03/20/2023 and suggestive of choledocholithiasis. The second density noted at the level of the ampulla on prior exam is not appreciated on today's exam. Consider further  evaluation with ERCP or MRCP. 3. Diverticulosis of the sigmoid colon without findings of diverticulitis. 4. Heterogeneous appearance of the thyroid with multiple hypodense nodules measuring up to 0.5 cm, not significantly changed compared to 11/17/2017. No follow-up imaging is indicated. 5. Aortic atherosclerosis. Aortic Atherosclerosis (ICD10-I70.0). Electronically Signed  By: Sherron Ales M.D.   On: 06/25/2023 17:26   CT HEAD WO CONTRAST  Result Date: 06/25/2023 CLINICAL DATA:  Head trauma, moderate-severe; Facial trauma, blunt; Polytrauma, blunt. Fall. EXAM: CT HEAD WITHOUT CONTRAST CT MAXILLOFACIAL WITHOUT CONTRAST CT CERVICAL SPINE WITHOUT CONTRAST TECHNIQUE: Multidetector CT imaging of the head, cervical spine, and maxillofacial structures were performed using the standard protocol without intravenous contrast. Multiplanar CT image reconstructions of the cervical spine and maxillofacial structures were also generated. RADIATION DOSE REDUCTION: This exam was performed according to the departmental dose-optimization program which includes automated exposure control, adjustment of the mA and/or kV according to patient size and/or use of iterative reconstruction technique. COMPARISON:  Head CT 05/28/2023 and MRI 05/17/2019. Cervical spine CT 11/17/2017. FINDINGS: CT HEAD FINDINGS Brain: There is no evidence of an acute infarct, intracranial hemorrhage, mass, midline shift, or extra-axial fluid collection. Confluent hypodensities in the cerebral [place] matter bilaterally are unchanged and nonspecific but compatible with severe chronic small vessel ischemic disease. A small to moderate-sized chronic left parieto-occipital infarct is again noted as well as chronic infarcts in the thalami and cerebellum bilaterally. There is moderate cerebral atrophy. Vascular: Calcified atherosclerosis at the skull base. No hyperdense vessel. Skull: No acute fracture or suspicious osseous lesion. Other: None. CT MAXILLOFACIAL  FINDINGS Osseous: No acute fracture, suspicious osseous lesion, or mandibular dislocation. Orbits: Bilateral cataract extraction.  No intraorbital hematoma. Sinuses: Paranasal sinuses and mastoid air cells are clear. Soft tissues: Right lateral forehead scalp laceration and small hematoma. CT CERVICAL SPINE FINDINGS Alignment: Unchanged trace anterolisthesis of C5 on C6. Skull base and vertebrae: No acute fracture or suspicious osseous lesion is identified within limitations of mild motion artifact. Unchanged ankylosis of the C3-4 vertebral bodies and left-sided posterior elements. Incomplete fusion of the posterior arch of C1. Soft tissues and spinal canal: No prevertebral fluid or swelling. No visible canal hematoma. Disc levels: Multilevel disc degeneration, most advanced at C6-7 and mildly progressed from 2018. Advanced multilevel facet arthrosis. Severe left greater than right neural foraminal stenosis at C6-7 due to uncovertebral spurring. Upper chest: Reported separately on contemporaneous chest CT. Other: None. IMPRESSION: 1. No evidence of acute intracranial abnormality. 2. Right forehead scalp laceration and hematoma. 3. No acute maxillofacial or cervical spine fracture. 4. 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 Electronically Signed   By: Sebastian Ache M.D.   On: 06/25/2023 17:08   CT CERVICAL SPINE WO CONTRAST  Result Date: 06/25/2023 CLINICAL DATA:  Head trauma, moderate-severe; Facial trauma, blunt; Polytrauma, blunt. Fall. EXAM: CT HEAD WITHOUT CONTRAST CT MAXILLOFACIAL WITHOUT CONTRAST CT CERVICAL SPINE WITHOUT CONTRAST TECHNIQUE: Multidetector CT imaging of the head, cervical spine, and maxillofacial structures were performed using the standard protocol without intravenous contrast. Multiplanar CT image reconstructions of the cervical spine and maxillofacial structures were also generated. RADIATION DOSE REDUCTION: This exam was performed according to the  departmental dose-optimization program which includes automated exposure control, adjustment of the mA and/or kV according to patient size and/or use of iterative reconstruction technique. COMPARISON:  Head CT 05/28/2023 and MRI 05/17/2019. Cervical spine CT 11/17/2017. FINDINGS: CT HEAD FINDINGS Brain: There is no evidence of an acute infarct, intracranial hemorrhage, mass, midline shift, or extra-axial fluid collection. Confluent hypodensities in the cerebral Imir Brumbach matter bilaterally are unchanged and nonspecific but compatible with severe chronic small vessel ischemic disease. A small to moderate-sized chronic left parieto-occipital infarct is again noted as well as chronic infarcts in the thalami and cerebellum bilaterally. There is moderate cerebral  atrophy. Vascular: Calcified atherosclerosis at the skull base. No hyperdense vessel. Skull: No acute fracture or suspicious osseous lesion. Other: None. CT MAXILLOFACIAL FINDINGS Osseous: No acute fracture, suspicious osseous lesion, or mandibular dislocation. Orbits: Bilateral cataract extraction.  No intraorbital hematoma. Sinuses: Paranasal sinuses and mastoid air cells are clear. Soft tissues: Right lateral forehead scalp laceration and small hematoma. CT CERVICAL SPINE FINDINGS Alignment: Unchanged trace anterolisthesis of C5 on C6. Skull base and vertebrae: No acute fracture or suspicious osseous lesion is identified within limitations of mild motion artifact. Unchanged ankylosis of the C3-4 vertebral bodies and left-sided posterior elements. Incomplete fusion of the posterior arch of C1. Soft tissues and spinal canal: No prevertebral fluid or swelling. No visible canal hematoma. Disc levels: Multilevel disc degeneration, most advanced at C6-7 and mildly progressed from 2018. Advanced multilevel facet arthrosis. Severe left greater than right neural foraminal stenosis at C6-7 due to uncovertebral spurring. Upper chest: Reported separately on contemporaneous  chest CT. Other: None. IMPRESSION: 1. No evidence of acute intracranial abnormality. 2. Right forehead scalp laceration and hematoma. 3. No acute maxillofacial or cervical spine fracture. 4. 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 Electronically Signed   By: Sebastian Ache M.D.   On: 06/25/2023 17:08   CT Maxillofacial Wo Contrast  Result Date: 06/25/2023 CLINICAL DATA:  Head trauma, moderate-severe; Facial trauma, blunt; Polytrauma, blunt. Fall. EXAM: CT HEAD WITHOUT CONTRAST CT MAXILLOFACIAL WITHOUT CONTRAST CT CERVICAL SPINE WITHOUT CONTRAST TECHNIQUE: Multidetector CT imaging of the head, cervical spine, and maxillofacial structures were performed using the standard protocol without intravenous contrast. Multiplanar CT image reconstructions of the cervical spine and maxillofacial structures were also generated. RADIATION DOSE REDUCTION: This exam was performed according to the departmental dose-optimization program which includes automated exposure control, adjustment of the mA and/or kV according to patient size and/or use of iterative reconstruction technique. COMPARISON:  Head CT 05/28/2023 and MRI 05/17/2019. Cervical spine CT 11/17/2017. FINDINGS: CT HEAD FINDINGS Brain: There is no evidence of an acute infarct, intracranial hemorrhage, mass, midline shift, or extra-axial fluid collection. Confluent hypodensities in the cerebral Jecenia Leamer matter bilaterally are unchanged and nonspecific but compatible with severe chronic small vessel ischemic disease. A small to moderate-sized chronic left parieto-occipital infarct is again noted as well as chronic infarcts in the thalami and cerebellum bilaterally. There is moderate cerebral atrophy. Vascular: Calcified atherosclerosis at the skull base. No hyperdense vessel. Skull: No acute fracture or suspicious osseous lesion. Other: None. CT MAXILLOFACIAL FINDINGS Osseous: No acute fracture, suspicious osseous lesion, or mandibular  dislocation. Orbits: Bilateral cataract extraction.  No intraorbital hematoma. Sinuses: Paranasal sinuses and mastoid air cells are clear. Soft tissues: Right lateral forehead scalp laceration and small hematoma. CT CERVICAL SPINE FINDINGS Alignment: Unchanged trace anterolisthesis of C5 on C6. Skull base and vertebrae: No acute fracture or suspicious osseous lesion is identified within limitations of mild motion artifact. Unchanged ankylosis of the C3-4 vertebral bodies and left-sided posterior elements. Incomplete fusion of the posterior arch of C1. Soft tissues and spinal canal: No prevertebral fluid or swelling. No visible canal hematoma. Disc levels: Multilevel disc degeneration, most advanced at C6-7 and mildly progressed from 2018. Advanced multilevel facet arthrosis. Severe left greater than right neural foraminal stenosis at C6-7 due to uncovertebral spurring. Upper chest: Reported separately on contemporaneous chest CT. Other: None. IMPRESSION: 1. No evidence of acute intracranial abnormality. 2. Right forehead scalp laceration and hematoma. 3. No acute maxillofacial or cervical spine fracture. 4. Severe chronic small vessel ischemic disease  with multiple chronic infarcts. Electronically Signed   By: Sebastian Ache M.D.   On: 06/25/2023 17:08   DG Pelvis Portable  Result Date: 06/25/2023 CLINICAL DATA:  Trauma EXAM: PORTABLE PELVIS 1-2 VIEWS COMPARISON:  01/10/2018 FINDINGS: There is no evidence of pelvic fracture or diastasis. No pelvic bone lesions are seen. Iliac wings are partially excluded. IMPRESSION: Negative. Electronically Signed   By: Corlis Leak M.D.   On: 06/25/2023 16:39   DG Chest Port 1 View  Result Date: 06/25/2023 CLINICAL DATA:  Trauma EXAM: PORTABLE CHEST - 1 VIEW COMPARISON:  05/28/2023 FINDINGS: Relatively low lung volumes, with no definite infiltrate. No pneumothorax. Heart size upper limits normal. Normal mediastinal contour. Aortic Atherosclerosis (ICD10-170.0). No effusion.  Left shoulder DJD. IMPRESSION: Low lung volumes. No acute findings. Electronically Signed   By: Corlis Leak M.D.   On: 06/25/2023 16:38      Assessment/Plan: 81yoF s/p fall at facility  Right forehead laceration: Repair per Dr. Karene Fry Likely reasonable to observe/workup possible syncope We will remain available to assist in her care - please let us know if any questions or concerns  I spent a total of 80 minutes in both face-to-face and non-face-to-face activities, excluding procedures performed, for this visit on the date of this encounter.  Marin Olp, MD [facility]

## 2023-06-25 NOTE — ED Notes (Signed)
Patient to imaging.

## 2023-06-25 NOTE — ED Notes (Signed)
Patient back from imaging

## 2023-06-25 NOTE — ED Notes (Signed)
Assumed care of patient lying in bed with provider suturing large laceration to right forehead and removing c-collar. Patient has Alzheimers andis resident of [facility] @ [address] [place] [phone].Per previous staff patient fell out of bed and arrived from facility with laceration vomiting and nausea. Pt is a/o x 1 follows commands GCS 14 laceration  has been repaired c-collar has been removed hospitalist and pharmacist at bedside for admit orders. Patient has DNR form and facility chart with her.Patient has no other injuries per imaging.

## 2023-06-25 NOTE — ED Notes (Signed)
Son mike  call  [phone]

## 2023-06-25 NOTE — Hospital Course (Addendum)
Sick in her bed, threw up, caretaker came in to help clean her up, and during that time was trying to get out of bed and fell.   Uses walker Independent in all ADLs and IADLs, incontinent, recurrent UTI, stage 4CKD, COPD  May not remmeber what relationsip Is to each other  Meredith Leeds   Previous UTI culture showed E. 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   Hypertension   COPD  Hx of CVA   7/1 Patient says she is breathing okay. Somnolent, unable to provide history.   Ps I held her depakote because it could be sedating  7/2 Patient says she had a good night last night. Pt does not know why she is in the hospital, disoriented. Does not remember her fall.   7/3 Patient says she slept well last night. Reports back pain.  7/4 Patient says she feels fine. Had a good night, slept well. Much more alert and interactive than previously. Drinking some water. Denies n/v. No abd pain. No other concerns, "if I did I wouldn't tell nobody." Had breakfast this morning, doesn't remember what she had. Not oriented to place. Goal: get into chair today.   7/5 Pt says she "doesn't know" if she slept well last night. Pt not responding appropriately to questions. Will touch base with son about discharge to [facility].

## 2023-06-26 DIAGNOSIS — D72829 Elevated white blood cell count, unspecified: Secondary | ICD-10-CM | POA: Diagnosis not present

## 2023-06-26 DIAGNOSIS — D631 Anemia in chronic kidney disease: Secondary | ICD-10-CM | POA: Diagnosis present

## 2023-06-26 DIAGNOSIS — K219 Gastro-esophageal reflux disease without esophagitis: Secondary | ICD-10-CM | POA: Diagnosis present

## 2023-06-26 DIAGNOSIS — K8050 Calculus of bile duct without cholangitis or cholecystitis without obstruction: Secondary | ICD-10-CM | POA: Diagnosis present

## 2023-06-26 DIAGNOSIS — Z1152 Encounter for screening for COVID-19: Secondary | ICD-10-CM | POA: Diagnosis not present

## 2023-06-26 DIAGNOSIS — F05 Delirium due to known physiological condition: Secondary | ICD-10-CM | POA: Diagnosis present

## 2023-06-26 DIAGNOSIS — N183 Chronic kidney disease, stage 3 unspecified: Secondary | ICD-10-CM | POA: Diagnosis not present

## 2023-06-26 DIAGNOSIS — E039 Hypothyroidism, unspecified: Secondary | ICD-10-CM | POA: Diagnosis present

## 2023-06-26 DIAGNOSIS — N1832 Chronic kidney disease, stage 3b: Secondary | ICD-10-CM

## 2023-06-26 DIAGNOSIS — E7800 Pure hypercholesterolemia, unspecified: Secondary | ICD-10-CM | POA: Diagnosis present

## 2023-06-26 DIAGNOSIS — Y92129 Unspecified place in nursing home as the place of occurrence of the external cause: Secondary | ICD-10-CM | POA: Diagnosis not present

## 2023-06-26 DIAGNOSIS — F0280 Dementia in other diseases classified elsewhere without behavioral disturbance: Secondary | ICD-10-CM

## 2023-06-26 DIAGNOSIS — F03918 Unspecified dementia, unspecified severity, with other behavioral disturbance: Secondary | ICD-10-CM | POA: Diagnosis present

## 2023-06-26 DIAGNOSIS — S0191XA Laceration without foreign body of unspecified part of head, initial encounter: Secondary | ICD-10-CM | POA: Diagnosis present

## 2023-06-26 DIAGNOSIS — W19XXXA Unspecified fall, initial encounter: Secondary | ICD-10-CM | POA: Diagnosis present

## 2023-06-26 DIAGNOSIS — K2289 Other specified disease of esophagus: Secondary | ICD-10-CM | POA: Diagnosis present

## 2023-06-26 DIAGNOSIS — I959 Hypotension, unspecified: Secondary | ICD-10-CM | POA: Diagnosis present

## 2023-06-26 DIAGNOSIS — K8070 Calculus of gallbladder and bile duct without cholecystitis without obstruction: Secondary | ICD-10-CM | POA: Diagnosis not present

## 2023-06-26 DIAGNOSIS — R7989 Other specified abnormal findings of blood chemistry: Secondary | ICD-10-CM | POA: Diagnosis not present

## 2023-06-26 DIAGNOSIS — E440 Moderate protein-calorie malnutrition: Secondary | ICD-10-CM | POA: Diagnosis present

## 2023-06-26 DIAGNOSIS — F0283 Dementia in other diseases classified elsewhere, unspecified severity, with mood disturbance: Secondary | ICD-10-CM | POA: Diagnosis present

## 2023-06-26 DIAGNOSIS — G4733 Obstructive sleep apnea (adult) (pediatric): Secondary | ICD-10-CM | POA: Diagnosis not present

## 2023-06-26 DIAGNOSIS — G309 Alzheimer's disease, unspecified: Secondary | ICD-10-CM

## 2023-06-26 DIAGNOSIS — S0181XA Laceration without foreign body of other part of head, initial encounter: Secondary | ICD-10-CM | POA: Diagnosis present

## 2023-06-26 DIAGNOSIS — I129 Hypertensive chronic kidney disease with stage 1 through stage 4 chronic kidney disease, or unspecified chronic kidney disease: Secondary | ICD-10-CM | POA: Diagnosis present

## 2023-06-26 DIAGNOSIS — K838 Other specified diseases of biliary tract: Secondary | ICD-10-CM | POA: Diagnosis not present

## 2023-06-26 DIAGNOSIS — E860 Dehydration: Secondary | ICD-10-CM | POA: Diagnosis present

## 2023-06-26 DIAGNOSIS — K3189 Other diseases of stomach and duodenum: Secondary | ICD-10-CM | POA: Diagnosis present

## 2023-06-26 DIAGNOSIS — F02818 Dementia in other diseases classified elsewhere, unspecified severity, with other behavioral disturbance: Secondary | ICD-10-CM | POA: Diagnosis present

## 2023-06-26 DIAGNOSIS — D649 Anemia, unspecified: Secondary | ICD-10-CM | POA: Diagnosis not present

## 2023-06-26 DIAGNOSIS — F32A Depression, unspecified: Secondary | ICD-10-CM | POA: Diagnosis present

## 2023-06-26 DIAGNOSIS — F0284 Dementia in other diseases classified elsewhere, unspecified severity, with anxiety: Secondary | ICD-10-CM | POA: Diagnosis present

## 2023-06-26 DIAGNOSIS — Z66 Do not resuscitate: Secondary | ICD-10-CM | POA: Diagnosis present

## 2023-06-26 DIAGNOSIS — K8020 Calculus of gallbladder without cholecystitis without obstruction: Secondary | ICD-10-CM | POA: Diagnosis not present

## 2023-06-26 DIAGNOSIS — J449 Chronic obstructive pulmonary disease, unspecified: Secondary | ICD-10-CM | POA: Diagnosis present

## 2023-06-26 DIAGNOSIS — Z23 Encounter for immunization: Secondary | ICD-10-CM | POA: Diagnosis present

## 2023-06-26 DIAGNOSIS — Z6833 Body mass index (BMI) 33.0-33.9, adult: Secondary | ICD-10-CM | POA: Diagnosis not present

## 2023-06-26 DIAGNOSIS — W06XXXA Fall from bed, initial encounter: Secondary | ICD-10-CM | POA: Diagnosis present

## 2023-06-26 DIAGNOSIS — Z9889 Other specified postprocedural states: Secondary | ICD-10-CM | POA: Diagnosis not present

## 2023-06-26 LAB — URINALYSIS, ROUTINE W REFLEX MICROSCOPIC
Glucose, UA: NEGATIVE mg/dL
Hgb urine dipstick: NEGATIVE
Ketones, ur: NEGATIVE mg/dL
Nitrite: NEGATIVE
Protein, ur: 30 mg/dL — AB
Specific Gravity, Urine: 1.031 — ABNORMAL HIGH (ref 1.005–1.030)
pH: 5 (ref 5.0–8.0)

## 2023-06-26 LAB — BASIC METABOLIC PANEL
Anion gap: 8 (ref 5–15)
BUN: 26 mg/dL — ABNORMAL HIGH (ref 8–23)
CO2: 22 mmol/L (ref 22–32)
Calcium: 8.2 mg/dL — ABNORMAL LOW (ref 8.9–10.3)
Chloride: 111 mmol/L (ref 98–111)
Creatinine, Ser: 1.58 mg/dL — ABNORMAL HIGH (ref 0.44–1.00)
GFR, Estimated: 33 mL/min — ABNORMAL LOW (ref >60)
Glucose, Bld: 130 mg/dL — ABNORMAL HIGH (ref 70–99)
Potassium: 4 mmol/L (ref 3.5–5.1)
Sodium: 141 mmol/L (ref 135–145)

## 2023-06-26 LAB — HEPATITIS PANEL, ACUTE
HCV Ab: NONREACTIVE
Hep A IgM: NONREACTIVE
Hep B C IgM: NONREACTIVE
Hepatitis B Surface Ag: NONREACTIVE

## 2023-06-26 LAB — HEPATIC FUNCTION PANEL
ALT: 171 U/L — ABNORMAL HIGH (ref 0–44)
AST: 205 U/L — ABNORMAL HIGH (ref 15–41)
Albumin: 2.4 g/dL — ABNORMAL LOW (ref 3.5–5.0)
Alkaline Phosphatase: 253 U/L — ABNORMAL HIGH (ref 38–126)
Bilirubin, Direct: 2 mg/dL — ABNORMAL HIGH (ref 0.0–0.2)
Indirect Bilirubin: 1.1 mg/dL — ABNORMAL HIGH (ref 0.3–0.9)
Total Bilirubin: 3.1 mg/dL — ABNORMAL HIGH (ref 0.3–1.2)
Total Protein: 4.8 g/dL — ABNORMAL LOW (ref 6.5–8.1)

## 2023-06-26 LAB — CBC
HCT: 33 % — ABNORMAL LOW (ref 36.0–46.0)
Hemoglobin: 10.8 g/dL — ABNORMAL LOW (ref 12.0–15.0)
MCH: 28.4 pg (ref 26.0–34.0)
MCHC: 32.7 g/dL (ref 30.0–36.0)
MCV: 86.8 fL (ref 80.0–100.0)
Platelets: 120 10*3/uL — ABNORMAL LOW (ref 150–400)
RBC: 3.8 MIL/uL — ABNORMAL LOW (ref 3.87–5.11)
RDW: 13.5 % (ref 11.5–15.5)
WBC: 16.1 10*3/uL — ABNORMAL HIGH (ref 4.0–10.5)
nRBC: 0 % (ref 0.0–0.2)

## 2023-06-26 LAB — MRSA NEXT GEN BY PCR, NASAL: MRSA by PCR Next Gen: NOT DETECTED

## 2023-06-26 MED ORDER — TRAZODONE HCL 50 MG PO TABS: 25.0000 mg | ORAL_TABLET | Freq: Every evening | ORAL | Status: DC | PRN
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
## 2023-06-26 NOTE — Consult Note (Signed)
Consultation Note   Referring Provider: Internal Medicine Teaching Service PCP: Pcp, No Primary Gastroenterologist: Lynann Bologna, MD  (2013)      Reason for consultation: possible choledocholithiasis  DOA: 06/25/2023         Hospital Day: 2   Assessment and Plan   Abnormal liver chemistries (mixed pattern), biliary duct dilation and ? Choledocholithiasis.  82 yo female with dementia admitted after a fall with head laceration. CT scan for trauma showed intrahepatic / extrahepatic duct dilatation, a 1.2 cm oval density  within the extrahepatic bile duct, similar to 03/20/2023. The second density seen at the level of the ampulla on prior exam is not appreciated on today's exam. She is s/p cholecystectomy. Liver chemistries are elevated in a mixed pattern. No recent labs to compare other than in Dec 2022 liver chemistries were normal.  -Acute hepatitis panel is negative.  -Check LFTs today to see if they trended down overnight.  -I don't think she would be able to cooperate for an MRCP. Dr. Meridee Score will review CT scan findings  Leukocytosis, WBC 16K. Etiology? UA is pending. She does have a history of recurrent UTIs. CXR negative. Cholangitis seems unlikely but not excluded. Currently not on any antibiotics. She is afebrile.   Mild  anemia. Hgb normal on admission so may be dilutional related to IVF  Dementia Unable to obtain history from her  History of Present Illness Patient is a 82 y.o. year old female whose past medical history includes but is not necessarily limited to CKD3 , GERD, Alzheimer's disease, HTN, history of CVA, recurrent UTIs  Patient transported [facility] ED from [facility] after a fall with a head laceration. CT trauma protocol was notable for choledocholithiasis with dilated CBD and prior CCY. Liver enzymes elevated. She was hypotensive on arrival but responded to IVF. She was hypoxic ( resolved with 02. Per H+P,  patient's son [name] [name] said patient had been nauseated with one episode of vomiting .   ED workup Afebrile, hypotensive:  71% 02 sat Alk phos 266, AST 205 / ALT 87, tbili 1.6.  Hgb 12.3, WBC 8.1,platelets 168 CXR > low lung volumes  CT scan  Impression:  Hepatobiliary: No focal liver abnormality is seen. Status post cholecystectomy. Mild intrahepatic dilatation. Extrahepatic bile duct is also dilated measuring 1.1 cm diameter (series 6, image 45). A 1.2 cm oval density is seen within the extrahepatic bile duct, similar to 03/20/2023 (series 3, image 59; series 6, image 45. The second density seen at the level of the ampulla on prior exam is not appreciated on today's exam.  Today"s labs WBC 16K Hgb 10.8 Platelets 120  Unable to obtain information from patient due to dementia. She cannot tells me where she is today nor where she lives. She doesn't know if she has had any abdominal pain.   Labs and Imaging: Recent Labs    06/25/23 1615 06/25/23 1626 06/26/23 0408  WBC 8.1  --  16.1*  HGB 12.3 11.9* 10.8*  HCT 38.0 35.0* 33.0*  PLT 168  --  120*   Recent Labs    06/25/23 1615 06/25/23 1626 06/26/23 0408  NA 142 144 141  K 3.9 3.9 4.0  CL 113* 113* 111  CO2 22  --  22  GLUCOSE 144* 140* 130*  BUN 29* 29* 26*  CREATININE 1.63* 1.70* 1.58*  CALCIUM 8.6*  --  8.2*   Recent Labs    06/25/23 1615  PROT 5.5*  ALBUMIN 2.9*  AST 205*  ALT 87*  ALKPHOS 266*  BILITOT 1.6*   Recent Labs    06/26/23 0408  HEPBSAG NON REACTIVE  HCVAB NON REACTIVE  HEPAIGM NON REACTIVE  HEPBIGM NON REACTIVE   Recent Labs    06/25/23 1615  LABPROT 13.4  INR 1.0    Previous GI Evaluation:   None in Epic   Principal Problem:   Choledocholithiasis Active Problems:   Fall   Chronic kidney disease, stage III (moderate) (HCC)   Dementia with behavioral disturbance (HCC)   Laceration of head   Past Medical History:  Diagnosis Date   Anxiety    GERD (gastroesophageal  reflux disease)    Hypercholesteremia    Hypertension     Past Surgical History:  Procedure Laterality Date   BACK SURGERY     CHOLECYSTECTOMY     NECK FUSION      History reviewed. No pertinent family history.  Prior to Admission medications   Medication Sig Start Date End Date Taking? Authorizing Provider  amLODipine (NORVASC) 10 MG tablet Take 10 mg by mouth daily.   Yes [provider]  atenolol (TENORMIN) 50 MG tablet Take 50 mg by mouth daily.     Yes [provider]  divalproex (DEPAKOTE) 125 MG DR tablet Take 125 mg by mouth 2 (two) times daily.   Yes [provider]  famotidine (PEPCID) 20 MG tablet Take 20 mg by mouth daily.   Yes [provider]  ferrous sulfate 325 (65 FE) MG EC tablet Take 325 mg by mouth daily.   Yes [provider]  fluticasone furoate-vilanterol (BREO ELLIPTA) 100-25 MCG/ACT AEPB Inhale 1 puff into the lungs daily.   Yes [provider]  gabapentin (NEURONTIN) 100 MG capsule Take 100 mg by mouth 3 (three) times daily.   Yes [provider]  hydrALAZINE (APRESOLINE) 25 MG tablet Take 25 mg by mouth 3 (three) times daily.   Yes [provider]  levothyroxine (SYNTHROID) 200 MCG tablet Take 200 mcg by mouth daily.   Yes [provider]  LORazepam (ATIVAN) 0.5 MG tablet Take 0.5 mg by mouth every 12 (twelve) hours as needed for anxiety (agitation).   Yes [provider]  montelukast (SINGULAIR) 10 MG tablet Take 10 mg by mouth in the morning and at bedtime.   Yes [provider]  QUEtiapine (SEROQUEL) 50 MG tablet Take 50 mg by mouth 2 (two) times daily.   Yes [provider]  sertraline (ZOLOFT) 100 MG tablet Take 100 mg by mouth daily.   Yes [provider]  cephALEXin (KEFLEX) 500 MG capsule Take 500 mg by mouth 4 (four) times daily. Patient not taking: Reported on 06/26/2023    [provider]  nitrofurantoin,  macrocrystal-monohydrate, (MACROBID) 100 MG capsule Take 100 mg by mouth at bedtime.   Patient not taking: Reported on 06/26/2023    [provider]  valACYclovir (VALTREX) 1000 MG tablet Take 1,000 mg by mouth 3 (three) times daily. For shingles Patient not taking: Reported on 06/26/2023    [provider]    Current Facility-Administered Medications  Medication Dose Route Frequency Provider Last Rate Last Admin   enoxaparin (LOVENOX) injection 30 mg  30 mg Subcutaneous Q24H Nooruddin, Jason Fila, MD  30 mg at 06/25/23 2103   fluticasone furoate-vilanterol (BREO ELLIPTA) 100-25 MCG/ACT 1 puff  1 puff Inhalation Daily Nooruddin, Saad, MD   1 puff at 06/26/23 1105   QUEtiapine (SEROQUEL) tablet 50 mg  50 mg Oral QHS Nooruddin, Saad, MD       sertraline (ZOLOFT) tablet 100 mg  100 mg Oral Daily Nooruddin, Saad, MD   100 mg at 06/26/23 0835   traZODone (DESYREL) tablet 25 mg  25 mg Oral QHS PRN Morrie Sheldon, MD        Allergies as of 06/25/2023 - Review Complete 06/25/2023  Allergen Reaction Noted   Codeine  05/06/2009   Darvocet [propoxyphene n-acetaminophen]  12/29/2011   Hydrocodone  12/29/2011   Morphine  05/06/2009   Pravachol  12/29/2011   Sulfa antibiotics  12/29/2011   Zocor [simvastatin - high dose]  12/29/2011    Social History   Socioeconomic History   Marital status: Married    Spouse name: Not on file   Number of children: Not on file   Years of education: Not on file   Highest education level: Not on file  Occupational History   Not on file  Tobacco Use   Smoking status: Never   Smokeless tobacco: Not on file  Substance and Sexual Activity   Alcohol use: No   Drug use: No   Sexual activity: Not on file  Other Topics Concern   Not on file  Social History Narrative   Not on file   Social Determinants of Health   Financial Resource Strain: Not on file  Food Insecurity: Patient Unable To Answer (06/25/2023)   Hunger Vital Sign    Worried About  Running Out of Food in the Last Year: Patient unable to answer    Ran Out of Food in the Last Year: Patient unable to answer  Transportation Needs: Patient Unable To Answer (06/25/2023)   PRAPARE - Transportation    Lack of Transportation (Medical): Patient unable to answer    Lack of Transportation (Non-Medical): Patient unable to answer  Physical Activity: Not on file  Stress: Not on file  Social Connections: Not on file  Intimate Partner Violence: Patient Unable To Answer (06/25/2023)   Humiliation, Afraid, Rape, and Kick questionnaire    Fear of Current or Ex-Partner: Patient unable to answer    Emotionally Abused: Patient unable to answer    Physically Abused: Patient unable to answer    Sexually Abused: Patient unable to answer     Code Status   Code Status: DNR  Review of Systems: All systems reviewed and negative except where noted in HPI.  Physical Exam: Vital signs in last 24 hours: Temp:  [98 F (36.7 C)-98.4 F (36.9 C)] 98.4 F (36.9 C) (07/01 0700) Pulse Rate:  [61-106] 77 (07/01 0700) Resp:  [14-29] 16 (07/01 0700) BP: (86-149)/(40-71) 131/55 (07/01 0700) SpO2:  [71 %-100 %] 97 % (07/01 1105) Weight:  [83.9 kg] 83.9 kg (06/30 1930) Last BM Date :  (UTA)  General:  female  in NAD Psych:  cooperative Eyes: Pupils equal Ears:  Normal auditory acuity Nose: No deformity, discharge or lesions Neck:  Supple, no masses felt Lungs:  bibasilar crackles, congested cough. .  Heart:  Regular rate, regular rhythm.  Abdomen:  Soft, nondistended, generalized tenderness. Active bowel sounds, no masses felt Rectal :  Deferred Msk: Symmetrical without gross deformities.  Neurologic:  Alert, no oriented to place / time.  Skin:  Intact without significant lesions.  Intake/Output from previous day: 06/30 0701 - 07/01 0700 In: 2000 [IV Piggyback:2000] Out: -  Intake/Output this shift:  No intake/output data recorded.     [name] Cluster, NP-C @  06/26/2023, 12:35  PM

## 2023-06-26 NOTE — Evaluation (Signed)
Physical Therapy Evaluation Patient Details Name: [name] [name] MRN: [id number] DOB: 06-14-1941 Today's Date: 06/26/2023  History of Present Illness  Pt is 82 year old presented to [facility] on  6/60/24 for fall with head laceration. Pt found to have choledocholithiasis. PMH - Alzheimer's, ckd, htn, depression.  Clinical Impression  Pt presents to PT with impaired mobility and dementia. Per chart pt was amb some at ALF with walker. Stood today but unable to attempt amb with one person assist due to unpredictability of pt. Feel return to familiar environment of ALF would be beneficial if she can be managed there. Could try PT to see if she will progress with repetition.         Assistance Recommended at Discharge Frequent or constant Supervision/Assistance  If plan is discharge home, recommend the following:  Can travel by private vehicle  A lot of help with walking and/or transfers;A lot of help with bathing/dressing/bathroom        Equipment Recommendations None recommended by PT  Recommendations for Other Services       Functional Status Assessment Patient has had a recent decline in their functional status and/or demonstrates limited ability to make significant improvements in function in a reasonable and predictable amount of time     Precautions / Restrictions Precautions Precautions: Fall      Mobility  Bed Mobility Overal bed mobility: Needs Assistance Bed Mobility: Supine to Sit, Sit to Supine     Supine to sit: Mod assist, HOB elevated Sit to supine: Mod assist   General bed mobility comments: Assist to bring legs off of bed, elevate trunk into sitting and bring hips to EOB. Assist to lower trunk and bring feet back up into bed.    Transfers Overall transfer level: Needs assistance Equipment used: Rollator (4 wheels) Transfers: Sit to/from Stand Sit to Stand: Mod assist           General transfer comment: Assist to power all the way up and for stability     Ambulation/Gait             Pre-gait activities: Stood x 2 minutes working on trunk and hip extension.    Stairs            Wheelchair Mobility     Tilt Bed    Modified Rankin (Stroke Patients Only)       Balance Overall balance assessment: Needs assistance Sitting-balance support: No upper extremity supported, Feet supported Sitting balance-Leahy Scale: Fair     Standing balance support: Bilateral upper extremity supported Standing balance-Leahy Scale: Poor Standing balance comment: rollator and min to mod assist                             Pertinent Vitals/Pain Pain Assessment Pain Assessment: No/denies pain    Home Living Family/patient expects to be discharged to:: Assisted living                        Prior Function Prior Level of Function : Patient poor historian/Family not available             Mobility Comments: Per chart pt using walker and w/c at ALF       Hand Dominance        Extremity/Trunk Assessment   Upper Extremity Assessment Upper Extremity Assessment: Defer to OT evaluation    Lower Extremity Assessment Lower Extremity Assessment: Generalized weakness  Communication   Communication: HOH  Cognition Arousal/Alertness: Awake/alert Behavior During Therapy: Restless Overall Cognitive Status: No family/caregiver present to determine baseline cognitive functioning                                 General Comments: Likely baseline with history of Alzheimer's. Inconsistently follows 1 step commands        General Comments General comments (skin integrity, edema, etc.): VSS on RA with SpO2 95-96%    Exercises     Assessment/Plan    PT Assessment Patient needs continued PT services  PT Problem List Decreased strength;Decreased activity tolerance;Decreased balance;Decreased mobility       PT Treatment Interventions DME instruction;Gait training;Functional mobility  training;Balance training;Therapeutic exercise;Therapeutic activities;Patient/family education    PT Goals (Current goals can be found in the Care Plan section)  Acute Rehab PT Goals Patient Stated Goal: Pt unable to state PT Goal Formulation: Patient unable to participate in goal setting Time For Goal Achievement: 07/10/23 Potential to Achieve Goals: Fair    Frequency Min 2X/week     Co-evaluation               AM-PAC PT "6 Clicks" Mobility  Outcome Measure Help needed turning from your back to your side while in a flat bed without using bedrails?: A Little Help needed moving from lying on your back to sitting on the side of a flat bed without using bedrails?: A Lot Help needed moving to and from a bed to a chair (including a wheelchair)?: Total Help needed standing up from a chair using your arms (e.g., wheelchair or bedside chair)?: A Lot Help needed to walk in hospital room?: Total Help needed climbing 3-5 steps with a railing? : Total 6 Click Score: 10    End of Session Equipment Utilized During Treatment: Gait belt Activity Tolerance: Patient tolerated treatment well Patient left: in bed;with call bell/phone within reach;with bed alarm set Nurse Communication: Mobility status PT Visit Diagnosis: Other abnormalities of gait and mobility (R26.89);History of falling (Z91.81)    Time: 1610-9604 PT Time Calculation (min) (ACUTE ONLY): 26 min   Charges:   PT Evaluation $PT Eval Moderate Complexity: 1 Mod PT Treatments $Therapeutic Activity: 8-22 mins PT General Charges $$ ACUTE PT VISIT: 1 Visit         [facility] PT Acute Rehabilitation Services Office [phone]   Angelina Ok [facility] 06/26/2023, 5:50 PM

## 2023-06-26 NOTE — Progress Notes (Signed)
Received pt via stretcher from ED alert to self, on 2 L [place]. Pt drowsy, dosing off frequently during assessments; provider notified. Pt noted with sutures to the lacerations on R side of face/forehead, some bleeding noted and dried blood. Pt denies pain/SOB/dizziness/N/V. Pt incontinent, voiding.
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
## 2023-06-27 DIAGNOSIS — K8050 Calculus of bile duct without cholangitis or cholecystitis without obstruction: Principal | ICD-10-CM

## 2023-06-27 DIAGNOSIS — R7989 Other specified abnormal findings of blood chemistry: Secondary | ICD-10-CM

## 2023-06-27 DIAGNOSIS — N183 Chronic kidney disease, stage 3 unspecified: Secondary | ICD-10-CM

## 2023-06-27 DIAGNOSIS — D72829 Elevated white blood cell count, unspecified: Secondary | ICD-10-CM | POA: Diagnosis not present

## 2023-06-27 DIAGNOSIS — W19XXXA Unspecified fall, initial encounter: Secondary | ICD-10-CM

## 2023-06-27 DIAGNOSIS — F32A Depression, unspecified: Secondary | ICD-10-CM

## 2023-06-27 DIAGNOSIS — K8020 Calculus of gallbladder without cholecystitis without obstruction: Secondary | ICD-10-CM

## 2023-06-27 DIAGNOSIS — D649 Anemia, unspecified: Secondary | ICD-10-CM | POA: Diagnosis not present

## 2023-06-27 LAB — COMPREHENSIVE METABOLIC PANEL
ALT: 137 U/L — ABNORMAL HIGH (ref 0–44)
AST: 128 U/L — ABNORMAL HIGH (ref 15–41)
Albumin: 2.4 g/dL — ABNORMAL LOW (ref 3.5–5.0)
Alkaline Phosphatase: 229 U/L — ABNORMAL HIGH (ref 38–126)
Anion gap: 9 (ref 5–15)
BUN: 28 mg/dL — ABNORMAL HIGH (ref 8–23)
CO2: 20 mmol/L — ABNORMAL LOW (ref 22–32)
Calcium: 8.3 mg/dL — ABNORMAL LOW (ref 8.9–10.3)
Chloride: 113 mmol/L — ABNORMAL HIGH (ref 98–111)
Creatinine, Ser: 1.55 mg/dL — ABNORMAL HIGH (ref 0.44–1.00)
GFR, Estimated: 33 mL/min — ABNORMAL LOW (ref >60)
Glucose, Bld: 82 mg/dL (ref 70–99)
Potassium: 3.6 mmol/L (ref 3.5–5.1)
Sodium: 142 mmol/L (ref 135–145)
Total Bilirubin: 2.8 mg/dL — ABNORMAL HIGH (ref 0.3–1.2)
Total Protein: 4.9 g/dL — ABNORMAL LOW (ref 6.5–8.1)

## 2023-06-27 LAB — CBC
HCT: 30.7 % — ABNORMAL LOW (ref 36.0–46.0)
Hemoglobin: 10.1 g/dL — ABNORMAL LOW (ref 12.0–15.0)
MCH: 28.1 pg (ref 26.0–34.0)
MCHC: 32.9 g/dL (ref 30.0–36.0)
MCV: 85.5 fL (ref 80.0–100.0)
Platelets: 100 10*3/uL — ABNORMAL LOW (ref 150–400)
RBC: 3.59 MIL/uL — ABNORMAL LOW (ref 3.87–5.11)
RDW: 13.7 % (ref 11.5–15.5)
WBC: 9.2 10*3/uL (ref 4.0–10.5)
nRBC: 0 % (ref 0.0–0.2)

## 2023-06-27 NOTE — Progress Notes (Signed)
Initial Nutrition Assessment  DOCUMENTATION CODES:   Non-severe (moderate) malnutrition in context of chronic illness  INTERVENTION:  Continue regular diet as ordered Feeding assistance with meals Carnation Breakfast Essentials TID, each packet mixed with 8 ounces of 2% milk provides 13 grams of protein and 260 calories. Magic cup TID with meals, each supplement provides 290 kcal and 9 grams of protein  NUTRITION DIAGNOSIS:   Moderate Malnutrition related to chronic illness (alzheimer's) as evidenced by mild fat depletion, severe muscle depletion.  GOAL:   Patient will meet greater than or equal to 90% of their needs  MONITOR:   PO intake, Supplement acceptance, Labs, Weight trends  REASON FOR ASSESSMENT:   Malnutrition Screening Tool    ASSESSMENT:   Pt admitted with from [facility] after a fall. Found to have choledocholithiasis. PMH significant for Alzheimer's dementia, CKD3b, HTN and depression.  Plans for ERCP tomorrow.   Attempted to speak with pt at bedside. Awoke to shoulder tap, smiled and went back to sleep. No family present at bedside. Pt unable to provide detailed nutrition history.  Flowsheet reflects pt oriented x1.   Breakfast tray at bedside during visit. Discussed with RN. NT to assist with feeding. No documented meal completions on file.   No prior documented weight history on file to review.  Admit weight: 83.9 kg  Medications reviewed  Labs: BUN 28, Cr 1.55, alkaline phos 229, AST 128, ALT 137, GFR 33  NUTRITION - FOCUSED PHYSICAL EXAM: Suspect facial depletions to be masked by presence of edema d/t fall and laceration. Flowsheet Row Most Recent Value  Orbital Region Mild depletion  Upper Arm Region Severe depletion  Thoracic and Lumbar Region No depletion  Buccal Region No depletion  Temple Region No depletion  [unable to assess R side d/t laceration/bruising]  Clavicle Bone Region Mild depletion  Clavicle and Acromion Bone  Region Mild depletion  Scapular Bone Region Moderate depletion  Dorsal Hand Severe depletion  Patellar Region Severe depletion  Anterior Thigh Region Severe depletion  Posterior Calf Region Severe depletion  Edema (RD Assessment) None  Hair Reviewed  Eyes Reviewed  Mouth Reviewed  Skin Reviewed  Nails Reviewed       Diet Order:   Diet Order             Diet NPO time specified  Diet effective midnight           Diet regular Room service appropriate? Yes; Fluid consistency: Thin  Diet effective now                   EDUCATION NEEDS:   No education needs have been identified at this time  Skin:  Skin Assessment: Reviewed RN Assessment  Last BM:  7/2 type 6 medium  Height:   Ht Readings from Last 1 Encounters:  06/25/23 5\' 2"  (1.575 m)    Weight:   Wt Readings from Last 1 Encounters:  06/25/23 83.9 kg    Ideal Body Weight:  50 kg  BMI:  Body mass index is 33.84 kg/m.  Estimated Nutritional Needs:   Kcal:  1400-1600  Protein:  70-85g  Fluid:  >/=1.5L Drusilla Kanner, RDN, LDN Clinical Nutrition
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
## 2023-06-27 NOTE — Evaluation (Signed)
Occupational Therapy Evaluation Patient Details Name: [name] [name] MRN: [id number] DOB: 02-23-1941 Today's Date: 06/27/2023   History of Present Illness Pt is 82 year old presented to [facility] on  6/60/24 for fall with head laceration. Pt found to have choledocholithiasis. PMH - Alzheimer's, ckd, htn, depression.   Clinical Impression   Patient admitted for the diagnosis above.  PTA she resides at a local memory care facility, has assist for ADL, and per the chart, can walk with a 4WRW, but also has access to a wheelchair.  Currently she is needing up to Mod A for basic mobility, she was able to feed herself for lunch with her fingers.  OT will follow in the acute setting to address mobility as it relates to toilet transfers.  No post acute OT is anticipated given her PLOF.        Recommendations for follow up therapy are one component of a multi-disciplinary discharge planning process, led by the attending physician.  Recommendations may be updated based on patient status, additional functional criteria and insurance authorization.   Assistance Recommended at Discharge Frequent or constant Supervision/Assistance  Patient can return home with the following Help with stairs or ramp for entrance;Assist for transportation;Assistance with cooking/housework;A lot of help with walking and/or transfers;A lot of help with bathing/dressing/bathroom;Direct supervision/assist for medications management    Functional Status Assessment  Patient has had a recent decline in their functional status and demonstrates the ability to make significant improvements in function in a reasonable and predictable amount of time.  Equipment Recommendations  None recommended by OT    Recommendations for Other Services       Precautions / Restrictions Precautions Precautions: Fall Restrictions Weight Bearing Restrictions: No      Mobility Bed Mobility Overal bed mobility: Needs Assistance Bed Mobility: Supine to  Sit     Supine to sit: Mod assist, HOB elevated          Transfers Overall transfer level: Needs assistance Equipment used: Rolling walker (2 wheels) Transfers: Sit to/from Stand, Bed to chair/wheelchair/BSC Sit to Stand: Min assist, Mod assist     Step pivot transfers: Min assist, Mod assist            Balance Overall balance assessment: Needs assistance Sitting-balance support: No upper extremity supported, Feet supported Sitting balance-Leahy Scale: Fair     Standing balance support: Bilateral upper extremity supported Standing balance-Leahy Scale: Poor                             ADL either performed or assessed with clinical judgement   ADL       Grooming: Minimal assistance;Sitting                   Toilet Transfer: Moderate assistance;Rolling walker (2 wheels);Stand-pivot;BSC/3in1                   Vision   Vision Assessment?: No apparent visual deficits     Perception     Praxis      Pertinent Vitals/Pain Pain Assessment Pain Assessment: Faces Faces Pain Scale: No hurt Pain Intervention(s): Monitored during session     Hand Dominance     Extremity/Trunk Assessment Upper Extremity Assessment Upper Extremity Assessment: Generalized weakness   Lower Extremity Assessment Lower Extremity Assessment: Defer to PT evaluation   Cervical / Trunk Assessment Cervical / Trunk Assessment: Kyphotic   Communication Communication Communication: HOH   Cognition Arousal/Alertness: Awake/alert  Behavior During Therapy: Restless, Agitated, Flat affect Overall Cognitive Status: History of cognitive impairments - at baseline                                 General Comments: Likely baseline with history of Alzheimer's.     General Comments       Exercises     Shoulder Instructions      Home Living Family/patient expects to be discharged to:: Assisted living                                  Additional Comments: Memory Care      Prior Functioning/Environment               Mobility Comments: Per chart pt using walker and w/c at ALF ADLs Comments: Assist as needed from staff        OT Problem List: Decreased strength;Impaired balance (sitting and/or standing)      OT Treatment/Interventions: Self-care/ADL training;Therapeutic activities;Patient/family education;Balance training;DME and/or AE instruction    OT Goals(Current goals can be found in the care plan section) Acute Rehab OT Goals OT Goal Formulation: Patient unable to participate in goal setting Time For Goal Achievement: 07/11/23 Potential to Achieve Goals: Good ADL Goals Pt Will Perform Grooming: with supervision;sitting Pt Will Transfer to Toilet: with supervision;ambulating;regular height toilet  OT Frequency: Min 1X/week    Co-evaluation              AM-PAC OT "6 Clicks" Daily Activity     Outcome Measure Help from another person eating meals?: A Little Help from another person taking care of personal grooming?: A Little Help from another person toileting, which includes using toliet, bedpan, or urinal?: A Lot Help from another person bathing (including washing, rinsing, drying)?: A Lot Help from another person to put on and taking off regular upper body clothing?: A Lot Help from another person to put on and taking off regular lower body clothing?: Total 6 Click Score: 13   End of Session Equipment Utilized During Treatment: Rolling walker (2 wheels) Nurse Communication: Mobility status  Activity Tolerance: Patient tolerated treatment well Patient left: in chair;with call bell/phone within reach;with chair alarm set  OT Visit Diagnosis: Unsteadiness on feet (R26.81)                Time: 1345-1405 OT Time Calculation (min): 20 min Charges:  OT General Charges $OT Visit: 1 Visit OT Evaluation $OT Eval Moderate Complexity: 1 Mod  06/27/2023  RP, OTR/L  Acute Rehabilitation  Services  Office:  [phone]   Suzanna Obey 06/27/2023, 2:26 PM

## 2023-06-27 NOTE — H&P (View-Only) (Signed)
   Daily Progress Note  DOA: 06/25/2023 Hospital Day: 3 Chief Complaint: choledocholithiasis   Assessment and Plan:    Brief Narrative:  [name] [name] is a 81 y.o. year old female whose past medical history includes, but isn't necessarily limited to, CKD3 , GERD, Alzheimer's disease, HTN, history of CVA, recurrent UTIs   Abnormal liver chemistries (mixed pattern), biliary duct dilation and choledocholithiasis on imaging.  81 yo female with dementia admitted after a fall with head laceration. CT scan for trauma showed intrahepatic / extrahepatic duct dilatation, a 1.2 cm oval density  within the extrahepatic bile duct, similar to 03/20/2023. She is s/p cholecystectomy. Liver chemistries are elevated in a mixed pattern. No recent labs to compare other than in Dec 2022 liver chemistries were normal.  -Overnight her liver chemistries remained about the same.  -Plan is for ERCP with stone extraction tomorrow. Dr. Mansouraty got consent from patient's son yesterday  -NPO after MN    Leukocytosis, resolved.   Mild [place] anemia. Hgb normal on admission so may be dilutional related to IVF -Hgb stable at 10.1   Dementia Unable to obtain history from her   Subjective / New Events:   No complaints but she doesn't answer any questions.    Objective:   Recent Labs    06/25/23 1615 06/25/23 1626 06/26/23 0408 06/27/23 0117  WBC 8.1  --  16.1* 9.2  HGB 12.3 11.9* 10.8* 10.1*  HCT 38.0 35.0* 33.0* 30.7*  PLT 168  --  120* 100*   BMET Recent Labs    06/25/23 1615 06/25/23 1626 06/26/23 0408 06/27/23 0117  NA 142 144 141 142  K 3.9 3.9 4.0 3.6  CL 113* 113* 111 113*  CO2 22  --  22 20*  GLUCOSE 144* 140* 130* 82  BUN 29* 29* 26* 28*  CREATININE 1.63* 1.70* 1.58* 1.55*  CALCIUM 8.6*  --  8.2* 8.3*   LFT Recent Labs    06/26/23 1328 06/27/23 0117  PROT 4.8* 4.9*  ALBUMIN 2.4* 2.4*  AST 205* 128*  ALT 171* 137*  ALKPHOS 253* 229*  BILITOT 3.1* 2.8*  BILIDIR 2.0*   --   IBILI 1.1*  --    PT/INR Recent Labs    06/25/23 1615  LABPROT 13.4  INR 1.0     Imaging:  CT CHEST ABDOMEN PELVIS W CONTRAST CLINICAL DATA:  Polytrauma.  Fall, multiple injuries.  EXAM: CT CHEST, ABDOMEN, AND PELVIS WITH CONTRAST  TECHNIQUE: Multidetector CT imaging of the chest, abdomen and pelvis was performed following the standard protocol during bolus administration of intravenous contrast.  RADIATION DOSE REDUCTION: This exam was performed according to the departmental dose-optimization program which includes automated exposure control, adjustment of the mA and/or kV according to patient size and/or use of iterative reconstruction technique.  CONTRAST:  75mL OMNIPAQUE IOHEXOL 350 MG/ML SOLN  COMPARISON:  CT abdomen pelvis 03/20/2023, CT chest 01/04/2018  FINDINGS: CT CHEST FINDINGS  Cardiovascular: Mitral annular calcifications. Normal heart size. No pericardial effusion.  Mediastinum/Nodes: Heterogeneous appearance of the thyroid with multiple hypodense nodules measuring up to 0.5 cm, not significantly changed compared to 11/17/2017 (series 3, image 2). No enlarged mediastinal, hilar, or axillary lymph nodes. The esophagus and trachea demonstrate no significant abnormality.  Lungs/Pleura: Subsegmental dependent atelectasis. No other focal consolidation no pleural effusion or pneumothorax.  Musculoskeletal: No chest wall mass or suspicious bone lesions identified. Old fractures of the left third through fifth ribs. Compression fracture of the T12 vertebral body is   unchanged compared to 03/20/2023. No acute osseous finding.  CT ABDOMEN PELVIS FINDINGS  Hepatobiliary: No focal liver abnormality is seen. Status post cholecystectomy. Mild intrahepatic dilatation. Extrahepatic bile duct is also dilated measuring 1.1 cm diameter (series 6, image 45). A 1.2 cm oval density is seen within the extrahepatic bile duct, similar to 03/20/2023 (series 3, image  59; series 6, image 45. The second density seen at the level of the ampulla on prior exam is not appreciated on today's exam.  Pancreas: Unremarkable. No pancreatic ductal dilatation or surrounding inflammatory changes.  Spleen: Normal in size.  Adrenals/Urinary Tract: Adrenal glands are unremarkable. Atrophic kidneys with multifocal cortical scarring. Multiple bilateral fluid density cortical cysts. No follow-up imaging is indicated. No suspicious mass or hydronephrosis. Urinary bladder is unremarkable.  Stomach/Bowel: Small hiatal hernia. Stomach is otherwise within normal limits. Appendix is not directly visualized, however there are no pericecal inflammatory changes. Diverticulosis of the sigmoid colon. No evidence of bowel wall thickening, distention, or inflammatory changes.  Vascular/Lymphatic: Aortic atherosclerosis. No enlarged abdominal or pelvic lymph nodes.  Reproductive: Uterus and bilateral adnexa are unremarkable.  Other: No abdominal wall hernia or abnormality. No abdominopelvic ascites.  Musculoskeletal: No acute or suspicious osseous findings. Bilateral pedicle screw and rod fixation spans L3-L4 with intervertebral spacer. Spinal instrumentation is intact. Stable grade 1 anterolisthesis of L4 on L5.  IMPRESSION: 1. No evidence of acute traumatic injury in the chest, abdomen, or pelvis. 2. Status post cholecystectomy with mild intrahepatic and extrahepatic bile duct dilatation. A 1.2 cm oval density is seen within the extrahepatic bile duct, similar to 03/20/2023 and suggestive of choledocholithiasis. The second density noted at the level of the ampulla on prior exam is not appreciated on today's exam. Consider further evaluation with ERCP or MRCP. 3. Diverticulosis of the sigmoid colon without findings of diverticulitis. 4. Heterogeneous appearance of the thyroid with multiple hypodense nodules measuring up to 0.5 cm, not significantly changed  compared to 11/17/2017. No follow-up imaging is indicated. 5. Aortic atherosclerosis.  Aortic Atherosclerosis (ICD10-I70.0).  Electronically Signed   By: Laura  Parra M.D.   On: 06/25/2023 17:26 CT Maxillofacial Wo Contrast CLINICAL DATA:  Head trauma, moderate-severe; Facial trauma, blunt; Polytrauma, blunt. Fall.  EXAM: CT HEAD WITHOUT CONTRAST  CT MAXILLOFACIAL WITHOUT CONTRAST  CT CERVICAL SPINE WITHOUT CONTRAST  TECHNIQUE: Multidetector CT imaging of the head, cervical spine, and maxillofacial structures were performed using the standard protocol without intravenous contrast. Multiplanar CT image reconstructions of the cervical spine and maxillofacial structures were also generated.  RADIATION DOSE REDUCTION: This exam was performed according to the departmental dose-optimization program which includes automated exposure control, adjustment of the mA and/or kV according to patient size and/or use of iterative reconstruction technique.  COMPARISON:  Head CT 05/28/2023 and MRI 05/17/2019. Cervical spine CT 11/17/2017.  FINDINGS: CT HEAD FINDINGS  Brain: There is no evidence of an acute infarct, intracranial hemorrhage, mass, midline shift, or extra-axial fluid collection. Confluent hypodensities in the cerebral white matter bilaterally are unchanged and nonspecific but compatible with severe chronic small vessel ischemic disease. A small to moderate-sized chronic left parieto-occipital infarct is again noted as well as chronic infarcts in the thalami and cerebellum bilaterally. There is moderate cerebral atrophy.  Vascular: Calcified atherosclerosis at the skull base. No hyperdense vessel.  Skull: No acute fracture or suspicious osseous lesion.  Other: None.  CT MAXILLOFACIAL FINDINGS  Osseous: No acute fracture, suspicious osseous lesion, or mandibular dislocation.  Orbits: Bilateral cataract extraction.  No intraorbital hematoma.    Sinuses: Paranasal  sinuses and mastoid air cells are clear.  Soft tissues: Right lateral forehead scalp laceration and small hematoma.  CT CERVICAL SPINE FINDINGS  Alignment: Unchanged trace anterolisthesis of C5 on C6.  Skull base and vertebrae: No acute fracture or suspicious osseous lesion is identified within limitations of mild motion artifact. Unchanged ankylosis of the C3-4 vertebral bodies and left-sided posterior elements. Incomplete fusion of the posterior arch of C1.  Soft tissues and spinal canal: No prevertebral fluid or swelling. No visible canal hematoma.  Disc levels: Multilevel disc degeneration, most advanced at C6-7 and mildly progressed from 2018. Advanced multilevel facet arthrosis. Severe left greater than right neural foraminal stenosis at C6-7 due to uncovertebral spurring.  Upper chest: Reported separately on contemporaneous chest CT.  Other: None.  IMPRESSION: 1. No evidence of acute intracranial abnormality. 2. Right forehead scalp laceration and hematoma. 3. No acute maxillofacial or cervical spine fracture. 4. 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  Electronically Signed   By: Allen  Grady M.D.   On: 06/25/2023 17:08 CT CERVICAL SPINE WO CONTRAST CLINICAL DATA:  Head trauma, moderate-severe; Facial trauma, blunt; Polytrauma, blunt. Fall.  EXAM: CT HEAD WITHOUT CONTRAST  CT MAXILLOFACIAL WITHOUT CONTRAST  CT CERVICAL SPINE WITHOUT CONTRAST  TECHNIQUE: Multidetector CT imaging of the head, cervical spine, and maxillofacial structures were performed using the standard protocol without intravenous contrast. Multiplanar CT image reconstructions of the cervical spine and maxillofacial structures were also generated.  RADIATION DOSE REDUCTION: This exam was performed according to the departmental dose-optimization program which includes automated exposure control, adjustment of the mA and/or kV according to patient size  and/or use of iterative reconstruction technique.  COMPARISON:  Head CT 05/28/2023 and MRI 05/17/2019. Cervical spine CT 11/17/2017.  FINDINGS: CT HEAD FINDINGS  Brain: There is no evidence of an acute infarct, intracranial hemorrhage, mass, midline shift, or extra-axial fluid collection. Confluent hypodensities in the cerebral white matter bilaterally are unchanged and nonspecific but compatible with severe chronic small vessel ischemic disease. A small to moderate-sized chronic left parieto-occipital infarct is again noted as well as chronic infarcts in the thalami and cerebellum bilaterally. There is moderate cerebral atrophy.  Vascular: Calcified atherosclerosis at the skull base. No hyperdense vessel.  Skull: No acute fracture or suspicious osseous lesion.  Other: None.  CT MAXILLOFACIAL FINDINGS  Osseous: No acute fracture, suspicious osseous lesion, or mandibular dislocation.  Orbits: Bilateral cataract extraction.  No intraorbital hematoma.  Sinuses: Paranasal sinuses and mastoid air cells are clear.  Soft tissues: Right lateral forehead scalp laceration and small hematoma.  CT CERVICAL SPINE FINDINGS  Alignment: Unchanged trace anterolisthesis of C5 on C6.  Skull base and vertebrae: No acute fracture or suspicious osseous lesion is identified within limitations of mild motion artifact. Unchanged ankylosis of the C3-4 vertebral bodies and left-sided posterior elements. Incomplete fusion of the posterior arch of C1.  Soft tissues and spinal canal: No prevertebral fluid or swelling. No visible canal hematoma.  Disc levels: Multilevel disc degeneration, most advanced at C6-7 and mildly progressed from 2018. Advanced multilevel facet arthrosis. Severe left greater than right neural foraminal stenosis at C6-7 due to uncovertebral spurring.  Upper chest: Reported separately on contemporaneous chest CT.  Other: None.  IMPRESSION: 1. No evidence of acute  intracranial abnormality. 2. Right forehead scalp laceration and hematoma. 3. No acute maxillofacial or cervical spine fracture. 4. 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  Electronically Signed   By: Allen    Grady M.D.   On: 06/25/2023 17:08 CT HEAD WO CONTRAST CLINICAL DATA:  Head trauma, moderate-severe; Facial trauma, blunt; Polytrauma, blunt. Fall.  EXAM: CT HEAD WITHOUT CONTRAST  CT MAXILLOFACIAL WITHOUT CONTRAST  CT CERVICAL SPINE WITHOUT CONTRAST  TECHNIQUE: Multidetector CT imaging of the head, cervical spine, and maxillofacial structures were performed using the standard protocol without intravenous contrast. Multiplanar CT image reconstructions of the cervical spine and maxillofacial structures were also generated.  RADIATION DOSE REDUCTION: This exam was performed according to the departmental dose-optimization program which includes automated exposure control, adjustment of the mA and/or kV according to patient size and/or use of iterative reconstruction technique.  COMPARISON:  Head CT 05/28/2023 and MRI 05/17/2019. Cervical spine CT 11/17/2017.  FINDINGS: CT HEAD FINDINGS  Brain: There is no evidence of an acute infarct, intracranial hemorrhage, mass, midline shift, or extra-axial fluid collection. Confluent hypodensities in the cerebral white matter bilaterally are unchanged and nonspecific but compatible with severe chronic small vessel ischemic disease. A small to moderate-sized chronic left parieto-occipital infarct is again noted as well as chronic infarcts in the thalami and cerebellum bilaterally. There is moderate cerebral atrophy.  Vascular: Calcified atherosclerosis at the skull base. No hyperdense vessel.  Skull: No acute fracture or suspicious osseous lesion.  Other: None.  CT MAXILLOFACIAL FINDINGS  Osseous: No acute fracture, suspicious osseous lesion, or mandibular dislocation.  Orbits:  Bilateral cataract extraction.  No intraorbital hematoma.  Sinuses: Paranasal sinuses and mastoid air cells are clear.  Soft tissues: Right lateral forehead scalp laceration and small hematoma.  CT CERVICAL SPINE FINDINGS  Alignment: Unchanged trace anterolisthesis of C5 on C6.  Skull base and vertebrae: No acute fracture or suspicious osseous lesion is identified within limitations of mild motion artifact. Unchanged ankylosis of the C3-4 vertebral bodies and left-sided posterior elements. Incomplete fusion of the posterior arch of C1.  Soft tissues and spinal canal: No prevertebral fluid or swelling. No visible canal hematoma.  Disc levels: Multilevel disc degeneration, most advanced at C6-7 and mildly progressed from 2018. Advanced multilevel facet arthrosis. Severe left greater than right neural foraminal stenosis at C6-7 due to uncovertebral spurring.  Upper chest: Reported separately on contemporaneous chest CT.  Other: None.  IMPRESSION: 1. No evidence of acute intracranial abnormality. 2. Right forehead scalp laceration and hematoma. 3. No acute maxillofacial or cervical spine fracture. 4. 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  Electronically Signed   By: Allen  Grady M.D.   On: 06/25/2023 17:08 DG Pelvis Portable CLINICAL DATA:  Trauma  EXAM: PORTABLE PELVIS 1-2 VIEWS  COMPARISON:  01/10/2018  FINDINGS: There is no evidence of pelvic fracture or diastasis. No pelvic bone lesions are seen. Iliac wings are partially excluded.  IMPRESSION: Negative.  Electronically Signed   By: D  Hassell M.D.   On: 06/25/2023 16:39 DG Chest Port 1 View CLINICAL DATA:  Trauma  EXAM: PORTABLE CHEST - 1 VIEW  COMPARISON:  05/28/2023  FINDINGS: Relatively low lung volumes, with no definite infiltrate. No pneumothorax.  Heart size upper limits normal. Normal mediastinal contour. Aortic Atherosclerosis (ICD10-170.0).  No  effusion.  Left shoulder DJD.  IMPRESSION: Low lung volumes. No acute findings.  Electronically Signed   By: D  Hassell M.D.   On: 06/25/2023 16:38     Scheduled inpatient medications:   fluticasone furoate-vilanterol  1 puff Inhalation Daily   QUEtiapine  50 mg Oral QHS   sertraline  100 mg Oral Daily   Continuous inpatient infusions:  PRN inpatient   medications: traZODone  Vital signs in last 24 hours: Temp:  [97.6 F (36.4 C)-98.4 F (36.9 C)] 97.8 F (36.6 C) (07/02 0808) Pulse Rate:  [76-85] 79 (07/02 0808) Resp:  [16-17] 17 (07/02 0808) BP: (105-150)/(56-85) 135/56 (07/02 0808) SpO2:  [94 %-100 %] 100 % (07/02 0808) Last BM Date :  (UTA)  Intake/Output Summary (Last 24 hours) at 06/27/2023 1252 Last data filed at 06/27/2023 1214 Gross per 24 hour  Intake --  Output 750 ml  Net -750 ml    Intake/Output from previous day: 07/01 0701 - 07/02 0700 In: -  Out: 500 [Urine:500] Intake/Output this shift: Total I/O In: -  Out: 250 [Urine:250]   Physical Exam:  General: Alert female in NAD Heart:  Regular rate and rhythm.  Pulmonary: Normal respiratory effort Abdomen: Soft, nondistended, nontender. Normal bowel sounds. Extremities: No lower extremity edema  Neurologic: Alert  Psych:  Cooperative.Doesn't answer any of my questions, has bland stare    Principal Problem:   Fall Active Problems:   Choledocholithiasis   Chronic kidney disease, stage III (moderate) (HCC)   Dementia with behavioral disturbance (HCC)   Laceration of head     LOS: 1 day   Shina Wass ,NP 06/27/2023, 12:52 PM       

## 2023-06-27 NOTE — Progress Notes (Signed)
Daily Progress Note  DOA: 06/25/2023 Hospital Day: 3 Chief Complaint: choledocholithiasis   Assessment and Plan:    Brief Narrative:  [name] [name] is a 82 y.o. year old female whose past medical history includes, but isn't necessarily limited to, CKD3 , GERD, Alzheimer's disease, HTN, history of CVA, recurrent UTIs   Abnormal liver chemistries (mixed pattern), biliary duct dilation and choledocholithiasis on imaging.  82 yo female with dementia admitted after a fall with head laceration. CT scan for trauma showed intrahepatic / extrahepatic duct dilatation, a 1.2 cm oval density  within the extrahepatic bile duct, similar to 03/20/2023. She is s/p cholecystectomy. Liver chemistries are elevated in a mixed pattern. No recent labs to compare other than in Dec 2022 liver chemistries were normal.  -Overnight her liver chemistries remained about the same.  -Plan is for ERCP with stone extraction tomorrow. Dr. Meridee Score got consent from patient's son yesterday  -NPO after MN    Leukocytosis, resolved.   Mild Greenwald anemia. Hgb normal on admission so may be dilutional related to IVF -Hgb stable at 10.1   Dementia Unable to obtain history from her   Subjective / New Events:   No complaints but she doesn't answer any questions.    Objective:   Recent Labs    06/25/23 1615 06/25/23 1626 06/26/23 0408 06/27/23 0117  WBC 8.1  --  16.1* 9.2  HGB 12.3 11.9* 10.8* 10.1*  HCT 38.0 35.0* 33.0* 30.7*  PLT 168  --  120* 100*   BMET Recent Labs    06/25/23 1615 06/25/23 1626 06/26/23 0408 06/27/23 0117  NA 142 144 141 142  K 3.9 3.9 4.0 3.6  CL 113* 113* 111 113*  CO2 22  --  22 20*  GLUCOSE 144* 140* 130* 82  BUN 29* 29* 26* 28*  CREATININE 1.63* 1.70* 1.58* 1.55*  CALCIUM 8.6*  --  8.2* 8.3*   LFT Recent Labs    06/26/23 1328 06/27/23 0117  PROT 4.8* 4.9*  ALBUMIN 2.4* 2.4*  AST 205* 128*  ALT 171* 137*  ALKPHOS 253* 229*  BILITOT 3.1* 2.8*  BILIDIR 2.0*   --   IBILI 1.1*  --    PT/INR Recent Labs    06/25/23 1615  LABPROT 13.4  INR 1.0     Imaging:  CT CHEST ABDOMEN PELVIS W CONTRAST CLINICAL DATA:  Polytrauma.  Fall, multiple injuries.  EXAM: CT CHEST, ABDOMEN, AND PELVIS WITH CONTRAST  TECHNIQUE: Multidetector CT imaging of the chest, abdomen and pelvis was performed following the standard protocol during bolus administration of intravenous contrast.  RADIATION DOSE REDUCTION: This exam was performed according to the departmental dose-optimization program which includes automated exposure control, adjustment of the mA and/or kV according to patient size and/or use of iterative reconstruction technique.  CONTRAST:  75mL OMNIPAQUE IOHEXOL 350 MG/ML SOLN  COMPARISON:  CT abdomen pelvis 03/20/2023, CT chest 01/04/2018  FINDINGS: CT CHEST FINDINGS  Cardiovascular: Mitral annular calcifications. Normal heart size. No pericardial effusion.  Mediastinum/Nodes: Heterogeneous appearance of the thyroid with multiple hypodense nodules measuring up to 0.5 cm, not significantly changed compared to 11/17/2017 (series 3, image 2). No enlarged mediastinal, hilar, or axillary lymph nodes. The esophagus and trachea demonstrate no significant abnormality.  Lungs/Pleura: Subsegmental dependent atelectasis. No other focal consolidation no pleural effusion or pneumothorax.  Musculoskeletal: No chest wall mass or suspicious bone lesions identified. Old fractures of the left third through fifth ribs. Compression fracture of the T12 vertebral body is  unchanged compared to 03/20/2023. No acute osseous finding.  CT ABDOMEN PELVIS FINDINGS  Hepatobiliary: No focal liver abnormality is seen. Status post cholecystectomy. Mild intrahepatic dilatation. Extrahepatic bile duct is also dilated measuring 1.1 cm diameter (series 6, image 45). A 1.2 cm oval density is seen within the extrahepatic bile duct, similar to 03/20/2023 (series 3, image  59; series 6, image 45. The second density seen at the level of the ampulla on prior exam is not appreciated on today's exam.  Pancreas: Unremarkable. No pancreatic ductal dilatation or surrounding inflammatory changes.  Spleen: Normal in size.  Adrenals/Urinary Tract: Adrenal glands are unremarkable. Atrophic kidneys with multifocal cortical scarring. Multiple bilateral fluid density cortical cysts. No follow-up imaging is indicated. No suspicious mass or hydronephrosis. Urinary bladder is unremarkable.  Stomach/Bowel: Small hiatal hernia. Stomach is otherwise within normal limits. Appendix is not directly visualized, however there are no pericecal inflammatory changes. Diverticulosis of the sigmoid colon. No evidence of bowel wall thickening, distention, or inflammatory changes.  Vascular/Lymphatic: Aortic atherosclerosis. No enlarged abdominal or pelvic lymph nodes.  Reproductive: Uterus and bilateral adnexa are unremarkable.  Other: No abdominal wall hernia or abnormality. No abdominopelvic ascites.  Musculoskeletal: No acute or suspicious osseous findings. Bilateral pedicle screw and rod fixation spans L3-L4 with intervertebral spacer. Spinal instrumentation is intact. Stable grade 1 anterolisthesis of L4 on L5.  IMPRESSION: 1. No evidence of acute traumatic injury in the chest, abdomen, or pelvis. 2. Status post cholecystectomy with mild intrahepatic and extrahepatic bile duct dilatation. A 1.2 cm oval density is seen within the extrahepatic bile duct, similar to 03/20/2023 and suggestive of choledocholithiasis. The second density noted at the level of the ampulla on prior exam is not appreciated on today's exam. Consider further evaluation with ERCP or MRCP. 3. Diverticulosis of the sigmoid colon without findings of diverticulitis. 4. Heterogeneous appearance of the thyroid with multiple hypodense nodules measuring up to 0.5 cm, not significantly changed  compared to 11/17/2017. No follow-up imaging is indicated. 5. Aortic atherosclerosis.  Aortic Atherosclerosis (ICD10-I70.0).  Electronically Signed   By: Sherron Ales M.D.   On: 06/25/2023 17:26 CT Maxillofacial Wo Contrast CLINICAL DATA:  Head trauma, moderate-severe; Facial trauma, blunt; Polytrauma, blunt. Fall.  EXAM: CT HEAD WITHOUT CONTRAST  CT MAXILLOFACIAL WITHOUT CONTRAST  CT CERVICAL SPINE WITHOUT CONTRAST  TECHNIQUE: Multidetector CT imaging of the head, cervical spine, and maxillofacial structures were performed using the standard protocol without intravenous contrast. Multiplanar CT image reconstructions of the cervical spine and maxillofacial structures were also generated.  RADIATION DOSE REDUCTION: This exam was performed according to the departmental dose-optimization program which includes automated exposure control, adjustment of the mA and/or kV according to patient size and/or use of iterative reconstruction technique.  COMPARISON:  Head CT 05/28/2023 and MRI 05/17/2019. Cervical spine CT 11/17/2017.  FINDINGS: CT HEAD FINDINGS  Brain: There is no evidence of an acute infarct, intracranial hemorrhage, mass, midline shift, or extra-axial fluid collection. Confluent hypodensities in the cerebral white matter bilaterally are unchanged and nonspecific but compatible with severe chronic small vessel ischemic disease. A small to moderate-sized chronic left parieto-occipital infarct is again noted as well as chronic infarcts in the thalami and cerebellum bilaterally. There is moderate cerebral atrophy.  Vascular: Calcified atherosclerosis at the skull base. No hyperdense vessel.  Skull: No acute fracture or suspicious osseous lesion.  Other: None.  CT MAXILLOFACIAL FINDINGS  Osseous: No acute fracture, suspicious osseous lesion, or mandibular dislocation.  Orbits: Bilateral cataract extraction.  No intraorbital hematoma.  Sinuses: Paranasal  sinuses and mastoid air cells are clear.  Soft tissues: Right lateral forehead scalp laceration and small hematoma.  CT CERVICAL SPINE FINDINGS  Alignment: Unchanged trace anterolisthesis of C5 on C6.  Skull base and vertebrae: No acute fracture or suspicious osseous lesion is identified within limitations of mild motion artifact. Unchanged ankylosis of the C3-4 vertebral bodies and left-sided posterior elements. Incomplete fusion of the posterior arch of C1.  Soft tissues and spinal canal: No prevertebral fluid or swelling. No visible canal hematoma.  Disc levels: Multilevel disc degeneration, most advanced at C6-7 and mildly progressed from 2018. Advanced multilevel facet arthrosis. Severe left greater than right neural foraminal stenosis at C6-7 due to uncovertebral spurring.  Upper chest: Reported separately on contemporaneous chest CT.  Other: None.  IMPRESSION: 1. No evidence of acute intracranial abnormality. 2. Right forehead scalp laceration and hematoma. 3. No acute maxillofacial or cervical spine fracture. 4. 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  Electronically Signed   By: Sebastian Ache M.D.   On: 06/25/2023 17:08 CT CERVICAL SPINE WO CONTRAST CLINICAL DATA:  Head trauma, moderate-severe; Facial trauma, blunt; Polytrauma, blunt. Fall.  EXAM: CT HEAD WITHOUT CONTRAST  CT MAXILLOFACIAL WITHOUT CONTRAST  CT CERVICAL SPINE WITHOUT CONTRAST  TECHNIQUE: Multidetector CT imaging of the head, cervical spine, and maxillofacial structures were performed using the standard protocol without intravenous contrast. Multiplanar CT image reconstructions of the cervical spine and maxillofacial structures were also generated.  RADIATION DOSE REDUCTION: This exam was performed according to the departmental dose-optimization program which includes automated exposure control, adjustment of the mA and/or kV according to patient size  and/or use of iterative reconstruction technique.  COMPARISON:  Head CT 05/28/2023 and MRI 05/17/2019. Cervical spine CT 11/17/2017.  FINDINGS: CT HEAD FINDINGS  Brain: There is no evidence of an acute infarct, intracranial hemorrhage, mass, midline shift, or extra-axial fluid collection. Confluent hypodensities in the cerebral white matter bilaterally are unchanged and nonspecific but compatible with severe chronic small vessel ischemic disease. A small to moderate-sized chronic left parieto-occipital infarct is again noted as well as chronic infarcts in the thalami and cerebellum bilaterally. There is moderate cerebral atrophy.  Vascular: Calcified atherosclerosis at the skull base. No hyperdense vessel.  Skull: No acute fracture or suspicious osseous lesion.  Other: None.  CT MAXILLOFACIAL FINDINGS  Osseous: No acute fracture, suspicious osseous lesion, or mandibular dislocation.  Orbits: Bilateral cataract extraction.  No intraorbital hematoma.  Sinuses: Paranasal sinuses and mastoid air cells are clear.  Soft tissues: Right lateral forehead scalp laceration and small hematoma.  CT CERVICAL SPINE FINDINGS  Alignment: Unchanged trace anterolisthesis of C5 on C6.  Skull base and vertebrae: No acute fracture or suspicious osseous lesion is identified within limitations of mild motion artifact. Unchanged ankylosis of the C3-4 vertebral bodies and left-sided posterior elements. Incomplete fusion of the posterior arch of C1.  Soft tissues and spinal canal: No prevertebral fluid or swelling. No visible canal hematoma.  Disc levels: Multilevel disc degeneration, most advanced at C6-7 and mildly progressed from 2018. Advanced multilevel facet arthrosis. Severe left greater than right neural foraminal stenosis at C6-7 due to uncovertebral spurring.  Upper chest: Reported separately on contemporaneous chest CT.  Other: None.  IMPRESSION: 1. No evidence of acute  intracranial abnormality. 2. Right forehead scalp laceration and hematoma. 3. No acute maxillofacial or cervical spine fracture. 4. 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  Electronically Signed   By: Freida Busman  Mosetta Putt M.D.   On: 06/25/2023 17:08 CT HEAD WO CONTRAST CLINICAL DATA:  Head trauma, moderate-severe; Facial trauma, blunt; Polytrauma, blunt. Fall.  EXAM: CT HEAD WITHOUT CONTRAST  CT MAXILLOFACIAL WITHOUT CONTRAST  CT CERVICAL SPINE WITHOUT CONTRAST  TECHNIQUE: Multidetector CT imaging of the head, cervical spine, and maxillofacial structures were performed using the standard protocol without intravenous contrast. Multiplanar CT image reconstructions of the cervical spine and maxillofacial structures were also generated.  RADIATION DOSE REDUCTION: This exam was performed according to the departmental dose-optimization program which includes automated exposure control, adjustment of the mA and/or kV according to patient size and/or use of iterative reconstruction technique.  COMPARISON:  Head CT 05/28/2023 and MRI 05/17/2019. Cervical spine CT 11/17/2017.  FINDINGS: CT HEAD FINDINGS  Brain: There is no evidence of an acute infarct, intracranial hemorrhage, mass, midline shift, or extra-axial fluid collection. Confluent hypodensities in the cerebral white matter bilaterally are unchanged and nonspecific but compatible with severe chronic small vessel ischemic disease. A small to moderate-sized chronic left parieto-occipital infarct is again noted as well as chronic infarcts in the thalami and cerebellum bilaterally. There is moderate cerebral atrophy.  Vascular: Calcified atherosclerosis at the skull base. No hyperdense vessel.  Skull: No acute fracture or suspicious osseous lesion.  Other: None.  CT MAXILLOFACIAL FINDINGS  Osseous: No acute fracture, suspicious osseous lesion, or mandibular dislocation.  Orbits:  Bilateral cataract extraction.  No intraorbital hematoma.  Sinuses: Paranasal sinuses and mastoid air cells are clear.  Soft tissues: Right lateral forehead scalp laceration and small hematoma.  CT CERVICAL SPINE FINDINGS  Alignment: Unchanged trace anterolisthesis of C5 on C6.  Skull base and vertebrae: No acute fracture or suspicious osseous lesion is identified within limitations of mild motion artifact. Unchanged ankylosis of the C3-4 vertebral bodies and left-sided posterior elements. Incomplete fusion of the posterior arch of C1.  Soft tissues and spinal canal: No prevertebral fluid or swelling. No visible canal hematoma.  Disc levels: Multilevel disc degeneration, most advanced at C6-7 and mildly progressed from 2018. Advanced multilevel facet arthrosis. Severe left greater than right neural foraminal stenosis at C6-7 due to uncovertebral spurring.  Upper chest: Reported separately on contemporaneous chest CT.  Other: None.  IMPRESSION: 1. No evidence of acute intracranial abnormality. 2. Right forehead scalp laceration and hematoma. 3. No acute maxillofacial or cervical spine fracture. 4. 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  Electronically Signed   By: Sebastian Ache M.D.   On: 06/25/2023 17:08 DG Pelvis Portable CLINICAL DATA:  Trauma  EXAM: PORTABLE PELVIS 1-2 VIEWS  COMPARISON:  01/10/2018  FINDINGS: There is no evidence of pelvic fracture or diastasis. No pelvic bone lesions are seen. Iliac wings are partially excluded.  IMPRESSION: Negative.  Electronically Signed   By: Corlis Leak M.D.   On: 06/25/2023 16:39 DG Chest Port 1 View CLINICAL DATA:  Trauma  EXAM: PORTABLE CHEST - 1 VIEW  COMPARISON:  05/28/2023  FINDINGS: Relatively low lung volumes, with no definite infiltrate. No pneumothorax.  Heart size upper limits normal. Normal mediastinal contour. Aortic Atherosclerosis (ICD10-170.0).  No  effusion.  Left shoulder DJD.  IMPRESSION: Low lung volumes. No acute findings.  Electronically Signed   By: Corlis Leak M.D.   On: 06/25/2023 16:38     Scheduled inpatient medications:   fluticasone furoate-vilanterol  1 puff Inhalation Daily   QUEtiapine  50 mg Oral QHS   sertraline  100 mg Oral Daily   Continuous inpatient infusions:  PRN inpatient  medications: traZODone  Vital signs in last 24 hours: Temp:  [97.6 F (36.4 C)-98.4 F (36.9 C)] 97.8 F (36.6 C) (07/02 0808) Pulse Rate:  [76-85] 79 (07/02 0808) Resp:  [16-17] 17 (07/02 0808) BP: (105-150)/(56-85) 135/56 (07/02 0808) SpO2:  [94 %-100 %] 100 % (07/02 0808) Last BM Date :  (UTA)  Intake/Output Summary (Last 24 hours) at 06/27/2023 1252 Last data filed at 06/27/2023 1214 Gross per 24 hour  Intake --  Output 750 ml  Net -750 ml    Intake/Output from previous day: 07/01 0701 - 07/02 0700 In: -  Out: 500 [Urine:500] Intake/Output this shift: Total I/O In: -  Out: 250 [Urine:250]   Physical Exam:  General: Alert female in NAD Heart:  Regular rate and rhythm.  Pulmonary: Normal respiratory effort Abdomen: Soft, nondistended, nontender. Normal bowel sounds. Extremities: No lower extremity edema  Neurologic: Alert  Psych:  Cooperative.Doesn't answer any of my questions, has bland stare    Principal Problem:   Fall Active Problems:   Choledocholithiasis   Chronic kidney disease, stage III (moderate) (HCC)   Dementia with behavioral disturbance (HCC)   Laceration of head     LOS: 1 day   Willette Cluster ,NP 06/27/2023, 12:52 PM

## 2023-06-28 ENCOUNTER — Inpatient Hospital Stay (HOSPITAL_COMMUNITY)

## 2023-06-28 ENCOUNTER — Encounter (HOSPITAL_COMMUNITY)
Admission: EM | Disposition: A | Discharge status: Skilled Nursing Facility | Payer: Self-pay | Source: Skilled Nursing Facility | Attending: Student in an Organized Health Care Education/Training Program

## 2023-06-28 ENCOUNTER — Encounter (HOSPITAL_COMMUNITY): Payer: Self-pay | Admitting: Student in an Organized Health Care Education/Training Program

## 2023-06-28 DIAGNOSIS — K3189 Other diseases of stomach and duodenum: Secondary | ICD-10-CM

## 2023-06-28 DIAGNOSIS — E440 Moderate protein-calorie malnutrition: Secondary | ICD-10-CM | POA: Insufficient documentation

## 2023-06-28 DIAGNOSIS — K838 Other specified diseases of biliary tract: Secondary | ICD-10-CM | POA: Diagnosis not present

## 2023-06-28 DIAGNOSIS — I129 Hypertensive chronic kidney disease with stage 1 through stage 4 chronic kidney disease, or unspecified chronic kidney disease: Secondary | ICD-10-CM

## 2023-06-28 DIAGNOSIS — K2289 Other specified disease of esophagus: Secondary | ICD-10-CM

## 2023-06-28 DIAGNOSIS — K8050 Calculus of bile duct without cholangitis or cholecystitis without obstruction: Secondary | ICD-10-CM

## 2023-06-28 DIAGNOSIS — K8051 Calculus of bile duct without cholangitis or cholecystitis with obstruction: Secondary | ICD-10-CM

## 2023-06-28 DIAGNOSIS — G4733 Obstructive sleep apnea (adult) (pediatric): Secondary | ICD-10-CM

## 2023-06-28 DIAGNOSIS — N183 Chronic kidney disease, stage 3 unspecified: Secondary | ICD-10-CM

## 2023-06-28 HISTORY — PX: REMOVAL OF STONES: SHX5545

## 2023-06-28 HISTORY — PX: ERCP: SHX5425

## 2023-06-28 HISTORY — PX: SPHINCTEROTOMY: SHX5544

## 2023-06-28 HISTORY — PX: BIOPSY: SHX5522

## 2023-06-28 LAB — CBC
HCT: 31.8 % — ABNORMAL LOW (ref 36.0–46.0)
Hemoglobin: 10.5 g/dL — ABNORMAL LOW (ref 12.0–15.0)
MCH: 28.2 pg (ref 26.0–34.0)
MCHC: 33 g/dL (ref 30.0–36.0)
MCV: 85.3 fL (ref 80.0–100.0)
Platelets: 123 10*3/uL — ABNORMAL LOW (ref 150–400)
RBC: 3.73 MIL/uL — ABNORMAL LOW (ref 3.87–5.11)
RDW: 13.4 % (ref 11.5–15.5)
WBC: 7.1 10*3/uL (ref 4.0–10.5)
nRBC: 0 % (ref 0.0–0.2)

## 2023-06-28 LAB — COMPREHENSIVE METABOLIC PANEL
ALT: 93 U/L — ABNORMAL HIGH (ref 0–44)
AST: 55 U/L — ABNORMAL HIGH (ref 15–41)
Albumin: 2.5 g/dL — ABNORMAL LOW (ref 3.5–5.0)
Alkaline Phosphatase: 203 U/L — ABNORMAL HIGH (ref 38–126)
Anion gap: 10 (ref 5–15)
BUN: 22 mg/dL (ref 8–23)
CO2: 23 mmol/L (ref 22–32)
Calcium: 8.6 mg/dL — ABNORMAL LOW (ref 8.9–10.3)
Chloride: 106 mmol/L (ref 98–111)
Creatinine, Ser: 1.47 mg/dL — ABNORMAL HIGH (ref 0.44–1.00)
GFR, Estimated: 36 mL/min — ABNORMAL LOW (ref >60)
Glucose, Bld: 92 mg/dL (ref 70–99)
Potassium: 3.5 mmol/L (ref 3.5–5.1)
Sodium: 139 mmol/L (ref 135–145)
Total Bilirubin: 2.1 mg/dL — ABNORMAL HIGH (ref 0.3–1.2)
Total Protein: 5.1 g/dL — ABNORMAL LOW (ref 6.5–8.1)

## 2023-06-28 SURGERY — ERCP, WITH INTERVENTION IF INDICATED
Anesthesia: General

## 2023-06-28 MED ORDER — FENTANYL CITRATE (PF) 250 MCG/5ML IJ SOLN
INTRAMUSCULAR | Status: DC | PRN
  Administered 2023-06-28: 25 ug via INTRAVENOUS
  Administered 2023-06-28: 50 ug via INTRAVENOUS

## 2023-06-28 MED ORDER — CIPROFLOXACIN IN D5W 400 MG/200ML IV SOLN
INTRAVENOUS | Status: AC
  Filled 2023-06-28: qty 200

## 2023-06-28 MED ORDER — DICLOFENAC SUPPOSITORY 100 MG
RECTAL | Status: AC
  Filled 2023-06-28: qty 1

## 2023-06-28 MED ORDER — LACTATED RINGERS IV SOLN: INTRAVENOUS | Status: DC

## 2023-06-28 MED ORDER — ENOXAPARIN SODIUM 30 MG/0.3ML IJ SOSY
30.0000 mg | PREFILLED_SYRINGE | INTRAMUSCULAR | Status: DC
  Administered 2023-06-29 – 2023-06-30 (×2): 30 mg via SUBCUTANEOUS
  Filled 2023-06-28 (×2): qty 0.3

## 2023-06-28 MED ORDER — SODIUM CHLORIDE 0.9 % IV SOLN: INTRAVENOUS | Status: DC

## 2023-06-28 MED ORDER — EPHEDRINE SULFATE-NACL 50-0.9 MG/10ML-% IV SOSY
PREFILLED_SYRINGE | INTRAVENOUS | Status: DC | PRN
  Administered 2023-06-28: 10 mg via INTRAVENOUS

## 2023-06-28 MED ORDER — SUGAMMADEX SODIUM 200 MG/2ML IV SOLN
INTRAVENOUS | Status: DC | PRN
  Administered 2023-06-28 (×2): 100 mg via INTRAVENOUS

## 2023-06-28 MED ORDER — ROCURONIUM BROMIDE 10 MG/ML (PF) SYRINGE
PREFILLED_SYRINGE | INTRAVENOUS | Status: DC | PRN
  Administered 2023-06-28: 50 mg via INTRAVENOUS

## 2023-06-28 MED ORDER — PROPOFOL 10 MG/ML IV BOLUS
INTRAVENOUS | Status: DC | PRN
  Administered 2023-06-28 (×3): 30 mg via INTRAVENOUS
  Administered 2023-06-28: 80 mg via INTRAVENOUS

## 2023-06-28 MED ORDER — LIDOCAINE 2% (20 MG/ML) 5 ML SYRINGE
INTRAMUSCULAR | Status: DC | PRN
  Administered 2023-06-28: 60 mg via INTRAVENOUS

## 2023-06-28 MED ORDER — SODIUM CHLORIDE 0.9 % IV SOLN
INTRAVENOUS | Status: DC | PRN
  Administered 2023-06-28: 35 mL

## 2023-06-28 MED ORDER — DEXAMETHASONE SODIUM PHOSPHATE 10 MG/ML IJ SOLN
INTRAMUSCULAR | Status: DC | PRN
  Administered 2023-06-28: 4 mg via INTRAVENOUS

## 2023-06-28 MED ORDER — ONDANSETRON HCL 4 MG/2ML IJ SOLN
INTRAMUSCULAR | Status: DC | PRN
  Administered 2023-06-28: 4 mg via INTRAVENOUS

## 2023-06-28 MED ORDER — DICLOFENAC SUPPOSITORY 100 MG
RECTAL | Status: DC | PRN
  Administered 2023-06-28: 100 mg via RECTAL

## 2023-06-28 MED ORDER — GLUCAGON HCL RDNA (DIAGNOSTIC) 1 MG IJ SOLR
INTRAMUSCULAR | Status: AC
  Filled 2023-06-28: qty 1

## 2023-06-28 MED ORDER — PHENYLEPHRINE 80 MCG/ML (10ML) SYRINGE FOR IV PUSH (FOR BLOOD PRESSURE SUPPORT)
PREFILLED_SYRINGE | INTRAVENOUS | Status: DC | PRN
  Administered 2023-06-28: 80 ug via INTRAVENOUS
  Administered 2023-06-28: 160 ug via INTRAVENOUS

## 2023-06-28 MED ORDER — FENTANYL CITRATE (PF) 100 MCG/2ML IJ SOLN
INTRAMUSCULAR | Status: AC
  Filled 2023-06-28: qty 2

## 2023-06-28 MED ORDER — CIPROFLOXACIN IN D5W 400 MG/200ML IV SOLN
INTRAVENOUS | Status: DC | PRN
  Administered 2023-06-28: 400 mg via INTRAVENOUS

## 2023-06-28 MED ORDER — PANTOPRAZOLE SODIUM 40 MG PO TBEC
40.0000 mg | DELAYED_RELEASE_TABLET | Freq: Every day | ORAL | Status: DC
  Administered 2023-06-29 – 2023-06-30 (×2): 40 mg via ORAL
  Filled 2023-06-28 (×2): qty 1

## 2023-06-28 NOTE — Transfer of Care (Signed)
Immediate Anesthesia Transfer of Care Note  Patient: [name] [name]  Procedure(s) Performed: ENDOSCOPIC RETROGRADE CHOLANGIOPANCREATOGRAPHY (ERCP) SPHINCTEROTOMY REMOVAL OF STONES BIOPSY  Patient Location: PACU and Endoscopy Unit  Anesthesia Type:General  Level of Consciousness: awake and patient cooperative  Airway & Oxygen Therapy: Patient Spontanous Breathing and Patient connected to face mask oxygen  Post-op Assessment: Report given to RN and Post -op Vital signs reviewed and stable  Post vital signs: Reviewed and stable  Last Vitals:  Vitals Value Taken Time  BP 135/52 06/28/23 1215  Temp    Pulse 76 06/28/23 1217  Resp 19 06/28/23 1217  SpO2 96 % 06/28/23 1217  Vitals shown include unvalidated device data.  Last Pain:  Vitals:   06/28/23 0953  TempSrc: Temporal  PainSc: 0-No pain         Complications: No notable events documented.

## 2023-06-28 NOTE — Progress Notes (Signed)
PT Cancellation Note  Patient Details Name: AGATHE MCCLUNEY MRN: [id number] DOB: [birth year]   Cancelled Treatment:    Reason Eval/Treat Not Completed: Patient at procedure or test/unavailable (Pt off the floor. Will follow up later if time allows.)   [name] 06/28/2023, 11:59 AM
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
## 2023-06-28 NOTE — TOC Initial Note (Signed)
Transition of Care [facility]) - Initial/Assessment Note    Patient Details  Name: [name] [name] MRN: [id number] Date of Birth: 1940/12/28  Transition of Care [facility]) CM/SW Contact:    Lorri Frederick, LCSW Phone Number: 06/28/2023, 12:54 PM  Clinical Narrative:     CSW informed by MD of potential return to [facility] tomorrow.  CSW spoke with Leanne at [facility], no RNs on site tomorrow, (holiday) would like pt to return on Friday.       Discussed PT--per Alesia Banda, pt under hospice services.  Pt oriented x1, CSW spoke with son Casimiro Needle.  He confirmed that pt is at [facility], said she is in "memory care ALF".  For a couple of reasons, he is looking for new memory care placement, but he confirmed that pt would return to [facility] at [facility].  Discussed rec for PT.  Casimiro Needle feels [facility] services are the most helpful service pt is getting and he does not want to discontinue that for PT.  If it is possible to keep hospice and get PT, that would be OK with him.  Discussed DC on Friday and he would be available to pick pt up and transport to [place] on Friday.    MD updated.         Expected Discharge Plan: Assisted Living [facility]) Barriers to Discharge: Continued Medical Work up   Patient Goals and CMS Choice     Choice offered to / list presented to : Adult Children (son Casimiro Needle)      Expected Discharge Plan and Services In-house Referral: Clinical Social Work   Post Acute Care Choice: Resumption of Paediatric nurse ([facility] [facility]) Living arrangements for the past 2 months: Assisted Living Facility [facility])                                      Prior Living Arrangements/Services Living arrangements for the past 2 months: Assisted Living Facility [facility]) Lives with:: Facility Resident Patient language and need for interpreter reviewed:: No        Need for Family Participation in Patient Care:  Yes (Comment) Care giver support system in place?: Yes (comment) Current home services: [facility]) Criminal Activity/Legal Involvement Pertinent to Current Situation/Hospitalization: No - Comment as needed  Activities of Daily Living Home Assistive Devices/Equipment: Other (Comment) ADL Screening (condition at time of admission) Patient's cognitive ability adequate to safely complete daily activities?: No Is the patient deaf or have difficulty hearing?: No Does the patient have difficulty seeing, even when wearing glasses/contacts?: No Does the patient have difficulty concentrating, remembering, or making decisions?: Yes Patient able to express need for assistance with ADLs?: No Does the patient have difficulty dressing or bathing?: Yes Independently performs ADLs?: No Communication: Independent Dressing (OT): Needs assistance Is this a change from baseline?: Pre-admission baseline (UTA) Grooming: Needs assistance Is this a change from baseline?: Pre-admission baseline (UTA) Feeding: Needs assistance (NPO) Is this a change from baseline?: Pre-admission baseline (UTA, NPO) Bathing: Needs assistance Is this a change from baseline?: Pre-admission baseline (UTA) Toileting: Needs assistance Is this a change from baseline?: Pre-admission baseline (UTA) In/Out Bed: Needs assistance Is this a change from baseline?: Pre-admission baseline (UTA) Walks in Home: Needs assistance (UTA) Is this a change from baseline?: Pre-admission baseline (UTA) Does the patient have difficulty walking or climbing stairs?: Yes Weakness of Legs: Both Weakness of Arms/Hands: Both  Permission Sought/Granted                  Emotional Assessment       Orientation: : Oriented to Self      Admission diagnosis:  Choledocholithiasis [K80.50] Facial laceration, initial encounter [S01.81XA] Fall, initial encounter [W19.XXXA] Hypotension, unspecified hypotension type [I95.9] Chronic kidney  disease, unspecified CKD stage [N18.9] Fall [W19.XXXA] Patient Active Problem List   Diagnosis Date Noted   Calculus of bile duct without cholecystitis with obstruction 06/28/2023   Malnutrition of moderate degree 06/28/2023   Abnormal LFTs 06/27/2023   Fall 06/26/2023   Dementia with behavioral disturbance (HCC) 06/26/2023   Laceration of head 06/26/2023   Choledocholithiasis 06/25/2023   Chronic kidney disease, stage III (moderate) (HCC) 08/07/2014   Coronary artery arteriosclerosis 08/07/2014   Obesity 08/07/2014   Sleep apnea, obstructive 08/07/2014   DDD (degenerative disc disease), lumbosacral 06/01/2013   PCP:  Pcp, No Pharmacy:  No Pharmacies Listed    Social Determinants of Health (SDOH) Social History: SDOH Screenings   Food Insecurity: Patient Unable To Answer (06/25/2023)  Housing: High Risk (06/25/2023)  Transportation Needs: Patient Unable To Answer (06/25/2023)  Utilities: Patient Unable To Answer (06/25/2023)  Tobacco Use: Unknown (06/28/2023)   SDOH Interventions:     Readmission Risk Interventions     No data to display

## 2023-06-28 NOTE — Anesthesia Procedure Notes (Signed)
Procedure Name: Intubation Date/Time: 06/28/2023 11:08 AM  Performed by: Darlina Guys, CRNAPre-anesthesia Checklist: Patient identified, Emergency Drugs available, Suction available and Patient being monitored Patient Re-evaluated:Patient Re-evaluated prior to induction Oxygen Delivery Method: Circle System Utilized Preoxygenation: Pre-oxygenation with 100% oxygen Induction Type: IV induction Ventilation: Mask ventilation without difficulty and Oral airway inserted - appropriate to patient size Laryngoscope Size: Glidescope and 3 Grade View: Grade I Tube type: Oral Tube size: 7.0 mm Number of attempts: 1 Airway Equipment and Method: Stylet, Oral airway and Video-laryngoscopy Placement Confirmation: ETT inserted through vocal cords under direct vision, positive ETCO2 and breath sounds checked- equal and bilateral Secured at: 21 cm Tube secured with: Tape Dental Injury: Teeth and Oropharynx as per pre-operative assessment

## 2023-06-28 NOTE — Anesthesia Postprocedure Evaluation (Signed)
Anesthesia Post Note  Patient: [name] [name]  Procedure(s) Performed: ENDOSCOPIC RETROGRADE CHOLANGIOPANCREATOGRAPHY (ERCP) SPHINCTEROTOMY REMOVAL OF STONES BIOPSY     Patient location during evaluation: PACU Anesthesia Type: General Level of consciousness: confused Pain management: pain level controlled Vital Signs Assessment: post-procedure vital signs reviewed and stable Respiratory status: spontaneous breathing, nonlabored ventilation, respiratory function stable and patient connected to nasal cannula oxygen Cardiovascular status: blood pressure returned to baseline and stable Postop Assessment: no apparent nausea or vomiting Anesthetic complications: no   No notable events documented.  Last Vitals:  Vitals:   06/28/23 1235 06/28/23 1255  BP: (!) 128/59 (!) 148/56  Pulse: 77 65  Resp: 15 15  Temp:  36.4 C  SpO2: 97% 100%    Last Pain:  Vitals:   06/28/23 1255  TempSrc: Oral  PainSc:                  Beryle Lathe

## 2023-06-28 NOTE — Op Note (Signed)
[facility] Patient Name: [name] [name] Procedure Date : 06/28/2023 MRN: [id number] Attending MD: Corliss Parish , MD, [id number] Date of Birth: 12-31-1940 CSN: [id number] Age: 82 Admit Type: Inpatient Procedure:                ERCP Indications:              Bile duct stone(s), Bile duct stone on Computed                            Tomogram Scan, Abnormal liver function test Providers:                Corliss Parish, MD, Stephens Shire RN, RN, Eliberto Ivory, RN, Leanne Lovely, Technician Referring MD:             Inpatient medical service Medicines:                General Anesthesia, Diclofenac 100 mg rectal, Cipro                            400 mg IV, Glucagon 0.5 mg IV Complications:            No immediate complications. Estimated Blood Loss:     Estimated blood loss was minimal. Procedure:                Pre-Anesthesia Assessment:                           - Prior to the procedure, a History and Physical                            was performed, and patient medications and                            allergies were reviewed. The patient's tolerance of                            previous anesthesia was also reviewed. The risks                            and benefits of the procedure and the sedation                            options and risks were discussed with the patient.                            All questions were answered, and informed consent                            was obtained. Prior Anticoagulants: The patient has                            taken no anticoagulant or antiplatelet agents. ASA  Grade Assessment: III - A patient with severe                            systemic disease. After reviewing the risks and                            benefits, the patient was deemed in satisfactory                            condition to undergo the procedure.                           After obtaining informed  consent, the scope was                            passed under direct vision. Throughout the                            procedure, the patient's blood pressure, pulse, and                            oxygen saturations were monitored continuously. The                            [employer] D single use                            duodenoscope was introduced through the mouth, and                            used to inject contrast into and used to inject                            contrast into the bile duct. The ERCP was                            accomplished without difficulty. The patient                            tolerated the procedure. Scope In: Scope Out: Findings:      A scout film of the abdomen was obtained. Surgical clips, consistent       with a previous cholecystectomy, were seen in the area of the right       upper quadrant of the abdomen.      The upper GI tract was traversed under direct vision without detailed       examination. The Z-line was irregular and was found 39 cm from the       incisors. A J-shaped deformity was found of the stomach. Patchy mildly       erythematous mucosa without bleeding was found in the entire examined       stomach?"biopsied for H. pylori assessment. No gross lesions were noted       in the duodenal bulb, in the first portion of the duodenum and in the  second portion of the duodenum. The major papilla was normal but did       have a large intraduodenal portion.      A short 0.035 inch Soft Jagwire was passed into the biliary tree. The       Hydratome sphincterotome was passed over the guidewire and the bile duct       was then deeply cannulated. Contrast was injected. I personally       interpreted the bile duct images. Ductal flow of contrast was adequate.       Image quality was adequate. Contrast extended to the hepatic ducts.       Opacification of the entire biliary tree except for the gallbladder was        successful. The lower third of the main bile duct contained filling       defects thought to be stones and sludge. The main bile duct was       moderately dilated. The largest diameter was 14 mm. A 12 mm biliary       sphincterotomy was made with a monofilament Hydratome sphincterotome       using ERBE electrocautery. There was no post-sphincterotomy bleeding.       The biliary tree was swept with a retrieval balloon. Sludge was swept       from the duct. Four stones were removed. No stones remained. An       occlusion cholangiogram was performed that showed no further significant       biliary pathology.      A pancreatogram was not performed.      The duodenoscope was withdrawn from the patient. Impression:               - Z-line irregular, 39 cm from the incisors.                           - J-shaped stomach deformity.                           - Erythematous mucosa in the stomach. Biopsied for                            H. pylori assessment.                           - No gross lesions in the duodenal bulb, in the                            first portion of the duodenum and in the second                            portion of the duodenum.                           - The major papilla appeared normal, but did have a                            large intraduodenal portion.                           - Filling defects  consistent with stones and sludge                            were seen on the cholangiogram.                           - The entire main bile duct was moderately dilated.                           - Choledocholithiasis was found. Complete removal                            was accomplished by biliary sphincterotomy and                            balloon sweeping. Recommendation:           - The patient will be observed post-procedure,                            until all discharge criteria are met.                           - Return patient to hospital ward for ongoing  care.                           - Advance diet as tolerated.                           - Observe patient's clinical course.                           - Check liver enzymes (AST, ALT, alkaline                            phosphatase, bilirubin) in the morning.                           - Await path results.                           - Watch for pancreatitis, bleeding, perforation,                            and cholangitis.                           - Initiate pantoprazole 40 mg once daily.                           - Would hold chemical VTE prophylaxis for 24 hours                            to decrease risk of post-interventional bleeding.                           - The  findings and recommendations were discussed                            with the patient's family.                           - The findings and recommendations were discussed                            with the referring physician. Procedure Code(s):        --- Professional ---                           [phone], Endoscopic retrograde                            cholangiopancreatography (ERCP); with removal of                            calculi/debris from biliary/pancreatic duct(s)                           43262, Endoscopic retrograde                            cholangiopancreatography (ERCP); with                            sphincterotomy/papillotomy                           74328, 26, Endoscopic catheterization of the                            biliary ductal system, radiological supervision and                            interpretation Diagnosis Code(s):        --- Professional ---                           K22.89, Other specified disease of esophagus                           K31.89, Other diseases of stomach and duodenum                           K80.50, Calculus of bile duct without cholangitis                            or cholecystitis without obstruction                           R79.89, Other specified abnormal  findings of blood                            chemistry  K83.8, Other specified diseases of biliary tract                           R93.2, Abnormal findings on diagnostic imaging of                            liver and biliary tract CPT copyright 2022 American Medical Association. All rights reserved. The codes documented in this report are preliminary and upon coder review may  be revised to meet current compliance requirements. Corliss Parish, MD 06/28/2023 12:09:19 PM Number of Addenda: 0

## 2023-06-28 NOTE — Anesthesia Preprocedure Evaluation (Addendum)
Anesthesia Evaluation  Patient identified by MRN, date of birth, ID band Patient confused    Reviewed: Allergy & Precautions, NPO status , Patient's Chart, lab work & pertinent test results, reviewed documented beta blocker date and time   History of Anesthesia Complications Negative for: history of anesthetic complications  Airway Mallampati: Unable to assess  TM Distance: >3 FB Neck ROM: Full    Dental  (+) Dental Advisory Given, Missing, Poor Dentition   Pulmonary sleep apnea    Pulmonary exam normal        Cardiovascular hypertension, Pt. on medications and Pt. on home beta blockers + CAD  Normal cardiovascular exam     Neuro/Psych  PSYCHIATRIC DISORDERS Anxiety    Dementia negative neurological ROS     GI/Hepatic Neg liver ROS,GERD  Controlled,,  Endo/Other   Obesity   Renal/GU CRFRenal disease     Musculoskeletal  (+) Arthritis ,    Abdominal   Peds  Hematology  (+) Blood dyscrasia, anemia  Plt 123k    Anesthesia Other Findings   Reproductive/Obstetrics                             Anesthesia Physical Anesthesia Plan  ASA: 4  Anesthesia Plan: General   Post-op Pain Management: Minimal or no pain anticipated   Induction: Intravenous  PONV Risk Score and Plan: 3 and Treatment may vary due to age or medical condition, Ondansetron and Propofol infusion  Airway Management Planned: Oral ETT  Additional Equipment: None  Intra-op Plan:   Post-operative Plan: Extubation in OR  Informed Consent: I have reviewed the patients History and Physical, chart, labs and discussed the procedure including the risks, benefits and alternatives for the proposed anesthesia with the patient or authorized representative who has indicated his/her understanding and acceptance.   Patient has DNR.  Discussed DNR with power of attorney and Suspend DNR.   Dental advisory given and Consent  reviewed with POA  Plan Discussed with: CRNA and Anesthesiologist  Anesthesia Plan Comments:        Anesthesia Quick Evaluation

## 2023-06-28 NOTE — Interval H&P Note (Signed)
History and Physical Interval Note:  06/28/2023 10:28 AM  [name] [name]  has presented today for surgery, with the diagnosis of choledocholithiasis and abnromal LFTs.  The various methods of treatment have been discussed with the patient and family. After consideration of risks, benefits and other options for treatment, the patient has consented to  Procedure(s): ENDOSCOPIC RETROGRADE CHOLANGIOPANCREATOGRAPHY (ERCP) (N/A) as a surgical intervention.  The patient's history has been reviewed, patient examined, no change in status, stable for surgery.  I have reviewed the patient's chart and labs.  Questions were answered to the patient's family satisfaction.    The risks of an ERCP were discussed at length, including but not limited to the risk of perforation, bleeding, abdominal pain, post-ERCP pancreatitis (while usually mild can be severe and even life threatening).    [employer]

## 2023-06-29 ENCOUNTER — Encounter (HOSPITAL_COMMUNITY): Payer: Self-pay | Admitting: Gastroenterology

## 2023-06-29 DIAGNOSIS — K8050 Calculus of bile duct without cholangitis or cholecystitis without obstruction: Secondary | ICD-10-CM | POA: Diagnosis not present

## 2023-06-29 DIAGNOSIS — Z9889 Other specified postprocedural states: Secondary | ICD-10-CM

## 2023-06-29 DIAGNOSIS — R7989 Other specified abnormal findings of blood chemistry: Secondary | ICD-10-CM | POA: Diagnosis not present

## 2023-06-29 LAB — COMPREHENSIVE METABOLIC PANEL
ALT: 68 U/L — ABNORMAL HIGH (ref 0–44)
AST: 33 U/L (ref 15–41)
Albumin: 2.5 g/dL — ABNORMAL LOW (ref 3.5–5.0)
Alkaline Phosphatase: 179 U/L — ABNORMAL HIGH (ref 38–126)
Anion gap: 14 (ref 5–15)
BUN: 28 mg/dL — ABNORMAL HIGH (ref 8–23)
CO2: 24 mmol/L (ref 22–32)
Calcium: 8.8 mg/dL — ABNORMAL LOW (ref 8.9–10.3)
Chloride: 102 mmol/L (ref 98–111)
Creatinine, Ser: 1.61 mg/dL — ABNORMAL HIGH (ref 0.44–1.00)
GFR, Estimated: 32 mL/min — ABNORMAL LOW (ref >60)
Glucose, Bld: 111 mg/dL — ABNORMAL HIGH (ref 70–99)
Potassium: 4.1 mmol/L (ref 3.5–5.1)
Sodium: 140 mmol/L (ref 135–145)
Total Bilirubin: 1 mg/dL (ref 0.3–1.2)
Total Protein: 5.3 g/dL — ABNORMAL LOW (ref 6.5–8.1)

## 2023-06-29 LAB — CBC
HCT: 33.2 % — ABNORMAL LOW (ref 36.0–46.0)
Hemoglobin: 11.3 g/dL — ABNORMAL LOW (ref 12.0–15.0)
MCH: 28.5 pg (ref 26.0–34.0)
MCHC: 34 g/dL (ref 30.0–36.0)
MCV: 83.6 fL (ref 80.0–100.0)
Platelets: 101 10*3/uL — ABNORMAL LOW (ref 150–400)
RBC: 3.97 MIL/uL (ref 3.87–5.11)
RDW: 13.2 % (ref 11.5–15.5)
WBC: 7.1 10*3/uL (ref 4.0–10.5)
nRBC: 0 % (ref 0.0–0.2)

## 2023-06-29 NOTE — NC FL2 (Signed)
[place] MEDICAID FL2 LEVEL OF CARE FORM     IDENTIFICATION  Patient Name: [name] [name] Birthdate: 1941/06/29 Sex: female Admission Date (Current Location): 06/25/2023  [facility] and [place] Number:  Producer, television/film/video and Address:  [facility] [place], [address] [place], [place] [address]      Provider Number: [id number]  Attending Physician Name and Address:  Tyson Alias, *  Relative Name and Phone Number:  Zuriyah, Atz   [phone]    Current Level of Care: Hospital Recommended Level of Care: Assisted Living Facility [facility]) Prior Approval Number:    Date Approved/Denied:   PASRR Number:    Discharge Plan: Other (Comment) [facility])    Current Diagnoses: Patient Active Problem List   Diagnosis Date Noted   History of ERCP 06/29/2023   Calculus of bile duct without cholecystitis with obstruction 06/28/2023   Malnutrition of moderate degree 06/28/2023   Abnormal LFTs 06/27/2023   Fall 06/26/2023   Dementia with behavioral disturbance (HCC) 06/26/2023   Laceration of head 06/26/2023   Choledocholithiasis 06/25/2023   Chronic kidney disease, stage III (moderate) (HCC) 08/07/2014   Coronary artery arteriosclerosis 08/07/2014   Obesity 08/07/2014   Sleep apnea, obstructive 08/07/2014   DDD (degenerative disc disease), lumbosacral 06/01/2013    Orientation RESPIRATION BLADDER Height & Weight     Self  O2 Incontinent, External catheter Weight: 184 lb 15.5 oz (83.9 kg) Height:  5\' 2"  (157.5 cm)  BEHAVIORAL SYMPTOMS/MOOD NEUROLOGICAL BOWEL NUTRITION STATUS      Incontinent Diet (regular)  AMBULATORY STATUS COMMUNICATION OF NEEDS Skin   Total Care Verbally Skin abrasions, Other (Comment) (laceration on face)                       Personal Care Assistance Level of Assistance  Bathing, Feeding, Dressing Bathing Assistance: Limited assistance Feeding assistance: Limited assistance Dressing Assistance:  Limited assistance     Functional Limitations Info  Sight, Hearing, Speech Sight Info: Adequate Hearing Info: Impaired Speech Info: Adequate    SPECIAL CARE FACTORS FREQUENCY                       Contractures Contractures Info: Not present    Additional Factors Info  Code Status, Allergies Code Status Info: DNR Allergies Info: Codeine, Darvocet (Propoxyphene N-acetaminophen), Hydrocodone, Morphine, Pravachol, Sulfa Antibiotics, Zocor (Simvastatin - High Dose)           Current Medications (06/29/2023):  This is the current hospital active medication list Current Facility-Administered Medications  Medication Dose Route Frequency Provider Last Rate Last Admin   enoxaparin (LOVENOX) injection 30 mg  30 mg Subcutaneous Q24H Atway, Rayann N, DO   30 mg at 06/29/23 0853   fluticasone furoate-vilanterol (BREO ELLIPTA) 100-25 MCG/ACT 1 puff  1 puff Inhalation Daily Nooruddin, Saad, MD   1 puff at 06/29/23 0853   lactated ringers infusion   Intravenous Continuous Mansouraty, Netty Starring., MD   Stopped at 06/28/23 1231   pantoprazole (PROTONIX) EC tablet 40 mg  40 mg Oral Daily Mansouraty, Netty Starring., MD   40 mg at 06/29/23 0851   QUEtiapine (SEROQUEL) tablet 50 mg  50 mg Oral QHS Nooruddin, Saad, MD   50 mg at 06/28/23 2120   sertraline (ZOLOFT) tablet 100 mg  100 mg Oral Daily Nooruddin, Saad, MD   100 mg at 06/29/23 0851   traZODone (DESYREL) tablet 25 mg  25 mg Oral QHS PRN Ghiles,  Fredricka Bonine, MD         Discharge Medications: Please see discharge summary for a list of discharge medications.  Relevant Imaging Results:  Relevant Lab Results:   Additional Information SSN: [id number]  Lorri Frederick, LCSW

## 2023-06-29 NOTE — Care Management Important Message (Signed)
Important Message  Patient Details  Name: [name] [name] MRN: [id number] Date of Birth: 27-Feb-1941   Medicare Important Message Given:  Yes     Terrionna Bridwell 06/29/2023, 10:26 AM

## 2023-06-29 NOTE — Care Management Important Message (Signed)
Important Message  Patient Details  Name: [name] MRN: [id number] Date of Birth: [birth year]   Medicare Important Message Given:  Yes     Renie Ora 06/29/2023, 8:48 AM

## 2023-06-29 NOTE — Progress Notes (Signed)
SDOH: Housing.  Pt is resident at [facility].  No housing concerns.  Daleen Squibb, MSW, LCSW 7/4/202410:24 AM

## 2023-06-29 NOTE — Progress Notes (Signed)
Physical Therapy Treatment Patient Details Name: [name] [name] MRN: [id number] DOB: January 09, 1941 Today's Date: 06/29/2023   History of Present Illness Pt is 82 year old presented to [facility] on  06/25/23 for fall with head laceration. Pt found to have choledocholithiasis. PMH - Alzheimer's, ckd, htn, depression.    PT Comments  Pt tolerated treatment well today. Pt able to transfer to chair with RW Mod A however continues to be limited by baseline cognitive deficits requiring frequent redirection and cues to stay on tasks as pt is very easily distracted. No change in DC/DME recs at this time. Pt will continue to follow.     Assistance Recommended at Discharge Frequent or constant Supervision/Assistance  If plan is discharge home, recommend the following:  Can travel by private vehicle    A lot of help with walking and/or transfers;A lot of help with bathing/dressing/bathroom      Equipment Recommendations  None recommended by PT    Recommendations for Other Services       Precautions / Restrictions Precautions Precautions: Fall Precaution Comments: Dementia Restrictions Weight Bearing Restrictions: No     Mobility  Bed Mobility Overal bed mobility: Needs Assistance Bed Mobility: Supine to Sit     Supine to sit: Mod assist, HOB elevated     General bed mobility comments: Assistance with trunk elevation and scooting to EOB. Pt easily distracted and requires multiple cues to stay on tasks.    Transfers Overall transfer level: Needs assistance Equipment used: Rolling walker (2 wheels) Transfers: Sit to/from Stand, Bed to chair/wheelchair/BSC Sit to Stand: Mod assist   Step pivot transfers: Min assist, Mod assist       General transfer comment: Assist to power all the way up and for stability. Pt again easily distracted and required max verbal and tactile to get to chair.    Ambulation/Gait                   Stairs             Wheelchair Mobility      Tilt Bed    Modified Rankin (Stroke Patients Only)       Balance Overall balance assessment: Needs assistance Sitting-balance support: No upper extremity supported, Feet supported Sitting balance-Leahy Scale: Fair     Standing balance support: Bilateral upper extremity supported Standing balance-Leahy Scale: Poor Standing balance comment: Reliant on RW                            Cognition Arousal/Alertness: Awake/alert Behavior During Therapy: Flat affect Overall Cognitive Status: History of cognitive impairments - at baseline                                 General Comments: Likely baseline with history of Alzheimer's.        Exercises      General Comments General comments (skin integrity, edema, etc.): VSS on 2L.      Pertinent Vitals/Pain Pain Assessment Pain Assessment: No/denies pain    Home Living                          Prior Function            PT Goals (current goals can now be found in the care plan section) Progress towards PT goals: Progressing toward goals    Frequency  Min 2X/week      PT Plan Current plan remains appropriate    Co-evaluation              AM-PAC PT "6 Clicks" Mobility   Outcome Measure  Help needed turning from your back to your side while in a flat bed without using bedrails?: A Little Help needed moving from lying on your back to sitting on the side of a flat bed without using bedrails?: A Lot Help needed moving to and from a bed to a chair (including a wheelchair)?: A Lot Help needed standing up from a chair using your arms (e.g., wheelchair or bedside chair)?: A Lot Help needed to walk in hospital room?: A Lot Help needed climbing 3-5 steps with a railing? : Total 6 Click Score: 12    End of Session Equipment Utilized During Treatment: Gait belt Activity Tolerance: Patient tolerated treatment well Patient left: in bed;with call bell/phone within reach;with bed  alarm set Nurse Communication: Mobility status PT Visit Diagnosis: Other abnormalities of gait and mobility (R26.89);History of falling (Z91.81)     Time: 0981-1914 PT Time Calculation (min) (ACUTE ONLY): 17 min  Charges:    $Therapeutic Activity: 8-22 mins PT General Charges $$ ACUTE PT VISIT: 1 Visit                     [name] [name], PT, DPT Acute Rehab Services [phone]    [name] 06/29/2023, 4:30 PM

## 2023-06-29 NOTE — Progress Notes (Addendum)
   Summary: Ms. Hendy is an [name] with past medical history of Alzheimer's dementia, CKD3b, HTN, and depression who presented to the ED from [facility] after a fall.    Subjective: The patient was evaluated at bedside this morning. No acute events overnight. Patient slept well. Alertness has improved from previous days. Patient can tolerate fluids and denies N/V, abdominal pain, or other concerns. She is not oriented to place but this is similar to baseline.   Objective:  Vital signs in last 24 hours: Vitals:   06/28/23 1546 06/28/23 2014 06/29/23 0448 06/29/23 0843  BP: 134/66 (!) 152/63 (!) 156/68 (!) 137/44  Pulse: 73 82 79 77  Resp: 16 17 17 16   Temp: 97.8 F (36.6 C) 98.8 F (37.1 C) 98.2 F (36.8 C) (!) 97.5 F (36.4 C)  TempSrc: Oral Oral Oral Oral  SpO2: 98% 97% 96% 96%  Weight:      Height:       Physical Exam:  Physical Exam Constitutional:      Appearance: Normal appearance.  HENT:     Head:     Comments: right forehead ecchymosis and lacerations with suture in place that are healing well Cardiovascular:     Rate and Rhythm: Normal rate and regular rhythm.  Pulmonary:     Effort: Pulmonary effort is normal.     Breath sounds: Normal breath sounds.  Abdominal:     Tenderness: There is abdominal tenderness.  Neurological:     Mental Status: She is confused.     Assessment/Plan:  Patient Summary Ms. Cheers is an [name] with past medical history of Alzheimer's dementia, CKD3b, and recurrent UTIs who presented to the ED from [facility] after a fall.   Principal Problem:   Fall Active Problems:   Choledocholithiasis   Chronic kidney disease, stage III (moderate) (HCC)   Dementia with behavioral disturbance (HCC)   Laceration of head   Abnormal LFTs   Calculus of bile duct without cholecystitis with obstruction   Malnutrition of moderate degree   History of ERCP  Choledocholithiasis  Incidental  choledocholithiasis found on CT of the abdomen on admission.  She had successful ERCP with stone extraction yesterday.  No postprocedural complications of the ERCP seen today.  Forehead laceration Healing well after a fall at her memory care unit.  Sutures will need to be removed around July 8.  Alzheimer' Disease  Symptomatically stable.  No issues with safety or aggressive behavior. - Seroquel   CKD3b Presented w/ Cr 1.63, GFR of 31.  Stable problem, no need to check further labs.  HTN Presented to ED hypotensive in 80s/40s - Monitor BP and resume upon stabilization   Depression  Hx of depression and anxiety currently on Zoloft 100 mg daily - Continue Zoloft  Prior to Admission [facility] Anticipated Discharge [facility] Barriers to Discharge: Medical management  Dispo: Anticipated discharge to memory care facility on Friday  Morrie Sheldon, MD 06/29/2023, 1:19 PM Pager: [phone] After 5pm on weekdays and 1pm on weekends: On Call pager [phone]

## 2023-06-29 NOTE — Progress Notes (Signed)
Gastroenterology Inpatient Follow-up Note   PATIENT IDENTIFICATION  [name] [name] is a 82 y.o. female  Hospital Day: 5  SUBJECTIVE  The patient's chart has been reviewed. The patient's labs have been reviewed. The patient is interviewed this morning. She remains pleasantly demented. The patient denies fevers or chills.   OBJECTIVE  Scheduled Inpatient Medications:   enoxaparin (LOVENOX) injection  30 mg Subcutaneous Q24H   fluticasone furoate-vilanterol  1 puff Inhalation Daily   pantoprazole  40 mg Oral Daily   QUEtiapine  50 mg Oral QHS   sertraline  100 mg Oral Daily   Continuous Inpatient Infusions:   lactated ringers Stopped (06/28/23 1231)   PRN Inpatient Medications: traZODone   Physical Examination  Temp:  [96.9 F (36.1 C)-98.8 F (37.1 C)] 97.5 F (36.4 C) (07/04 0843) Pulse Rate:  [65-82] 77 (07/04 0843) Resp:  [15-20] 16 (07/04 0843) BP: (128-156)/(44-68) 137/44 (07/04 0843) SpO2:  [92 %-100 %] 96 % (07/04 0843) Temp (24hrs), Avg:97.8 F (36.6 C), Min:96.9 F (36.1 C), Max:98.8 F (37.1 C)  Weight: 83.9 kg GEN: NAD, appears stated age, doesn't appear chronically ill PSYCH: Cooperative, without pressured speech EYE: Conjunctivae pink, sclerae anicteric ENT: MMM CV: Nontachycardic RESP: No audible wheezing GI: NABS, soft, protuberant abdomen, ND, without rebound or guarding MSK/EXT: No lower extremity edema SKIN: No jaundice NEURO:  Alert & Oriented x 1 (to person)   Review of Data   Laboratory Studies   Recent Labs  Lab 06/29/23 0715  NA 140  K 4.1  CL 102  CO2 24  BUN 28*  CREATININE 1.61*  GLUCOSE 111*  CALCIUM 8.8*   Recent Labs  Lab 06/29/23 0715  AST 33  ALT 68*  ALKPHOS 179*    Recent Labs  Lab 06/27/23 0117 06/28/23 0201 06/29/23 0221  WBC 9.2 7.1 7.1  HGB 10.1* 10.5* 11.3*  HCT 30.7* 31.8* 33.2*  PLT 100* 123* 101*   Recent Labs  Lab 06/25/23 1615  INR 1.0   Imaging Studies  DG ERCP  Result Date:  06/28/2023 CLINICAL DATA:  82 year old female with choledocholithiasis EXAM: ERCP TECHNIQUE: Multiple spot images obtained with the fluoroscopic device and submitted for interpretation post-procedure. FLUOROSCOPY: Radiation Exposure Index (as provided by the fluoroscopic device): 54.7 mGy Kerma COMPARISON:  CT 06/25/2023 FINDINGS: Limited intraoperative fluoroscopic spot images during ERCP. Initial image demonstrates the endoscope projecting over the upper abdomen. Subsequently there is cannulation of the ampulla with retrograde infusion of contrast partially opacifying the intrahepatic and extrahepatic biliary system. Surgical changes of cholecystectomy. IMPRESSION: Limited images during ERCP, as above. Please refer to the dictated operative report for full details of intraoperative findings and procedure. Electronically Signed   By: Gilmer Mor D.O.   On: 06/28/2023 12:21    GI Procedures and Studies  ERCP - Z-line irregular, 39 cm from the incisors. - J-shaped stomach deformity. - Erythematous mucosa in the stomach. Biopsied for H. pylori assessment. - No gross lesions in the duodenal bulb, in the first portion of the duodenum and in the second portion of the duodenum. - The major papilla appeared normal, but did have a large intraduodenal portion. - Filling defects consistent with stones and sludge were seen on the cholangiogram. - The entire main bile duct was moderately dilated. - Choledocholithiasis was found. Complete removal was accomplished by biliary sphincterotomy and balloon sweeping.    ASSESSMENT  [name] is a 82 y.o. female with a pmh significant for Alzheimer's disease, previous CVAs, hypertension, chronic renal  insufficiency, GERD, status postcholecystectomy.  Patient admitted after a fall and found to have dilated bile duct and abnormal LFTs with choledocholithiasis now status post ERCP with sphincterotomy and stone extraction.  The patient is hemodynamically and clinically stable at  this time.  She does not account any abdominal pain or discomfort, but she is pleasantly demented and I am not sure that she can truly tell us if there are other issues.  Thankfully ERCP went well yesterday and I see no evidence of post ERCP complications.  I have no contraindication for potential discharge from a GI perspective.  Her liver biochemical tests can be followed up by her primary care provider, and if there are issues that require additional workup or management, she can be seen in our follow-up clinic since she previously had seen Dr. Chales Abrahams years ago in [place].  We will sign off at this time.   PLAN/RECOMMENDATIONS  Trend LFTs while in-house When discharged should have repeat LFTs in 2 to 3 weeks I will follow-up pathology when it returns No contraindication from GI perspective for potential discharge at this time   We will sign off but if issues arise, please page/call with questions or concerns.   Corliss Parish, MD [facility] Office # [phone]    LOS: 3 days  Lemar Lofty  06/29/2023, 10:28 AM

## 2023-06-30 LAB — COMPREHENSIVE METABOLIC PANEL
ALT: 83 U/L — ABNORMAL HIGH (ref 0–44)
AST: 91 U/L — ABNORMAL HIGH (ref 15–41)
Albumin: 2.6 g/dL — ABNORMAL LOW (ref 3.5–5.0)
Alkaline Phosphatase: 190 U/L — ABNORMAL HIGH (ref 38–126)
Anion gap: 9 (ref 5–15)
BUN: 25 mg/dL — ABNORMAL HIGH (ref 8–23)
CO2: 24 mmol/L (ref 22–32)
Calcium: 8.6 mg/dL — ABNORMAL LOW (ref 8.9–10.3)
Chloride: 104 mmol/L (ref 98–111)
Creatinine, Ser: 1.61 mg/dL — ABNORMAL HIGH (ref 0.44–1.00)
GFR, Estimated: 32 mL/min — ABNORMAL LOW (ref >60)
Glucose, Bld: 85 mg/dL (ref 70–99)
Potassium: 3.9 mmol/L (ref 3.5–5.1)
Sodium: 137 mmol/L (ref 135–145)
Total Bilirubin: 1 mg/dL (ref 0.3–1.2)
Total Protein: 5.1 g/dL — ABNORMAL LOW (ref 6.5–8.1)

## 2023-06-30 LAB — CBC
HCT: 32.9 % — ABNORMAL LOW (ref 36.0–46.0)
Hemoglobin: 10.9 g/dL — ABNORMAL LOW (ref 12.0–15.0)
MCH: 28.6 pg (ref 26.0–34.0)
MCHC: 33.1 g/dL (ref 30.0–36.0)
MCV: 86.4 fL (ref 80.0–100.0)
Platelets: 162 10*3/uL (ref 150–400)
RBC: 3.81 MIL/uL — ABNORMAL LOW (ref 3.87–5.11)
RDW: 13.3 % (ref 11.5–15.5)
WBC: 6.9 10*3/uL (ref 4.0–10.5)
nRBC: 0 % (ref 0.0–0.2)

## 2023-06-30 MED ORDER — QUETIAPINE FUMARATE 50 MG PO TABS: 50.0000 mg | ORAL_TABLET | Freq: Every day | ORAL | Status: AC

## 2023-06-30 MED ORDER — TRAZODONE HCL 50 MG PO TABS: 25.0000 mg | ORAL_TABLET | Freq: Every evening | ORAL | Status: AC | PRN

## 2023-06-30 NOTE — Plan of Care (Signed)
  Problem: Education: Goal: Knowledge of General Education information will improve Description: Including pain rating scale, medication(s)/side effects and non-pharmacologic comfort measures Outcome: Not Progressing   Problem: Health Behavior/Discharge Planning: Goal: Ability to manage health-related needs will improve Outcome: Not Progressing   Problem: Clinical Measurements: Goal: Will remain free from infection Outcome: Progressing Goal: Diagnostic test results will improve Outcome: Progressing

## 2023-06-30 NOTE — TOC Progression Note (Addendum)
Transition of Care [facility]) - Progression Note    Patient Details  Name: [name] [name] MRN: [id number] Date of Birth: 08/21/1941  [facility] of [facility]) CM/SW Contact  Mearl Latin, LCSW Phone Number: 06/30/2023, 8:56 AM  Clinical Narrative:    8:54am-CSW spoke with [facility] and confirmed patient is on their memory care side. They can accept patient today if they have the dc paperwork by 2:30pm. They do not want to accept her on the weekend as there will be no RN present. Patient does not have oxygen at the facility so CSW requested RN test to see if she needs it. CSW updated [facility] (patient is active) and they can order oxygen for her if needed. Facility fax number is [phone].    11am-Per RN patient requires oxygen. CSW made Marylene Land at [facility] aware and she has ordered it to be delivered to the facility.  CSW faxed DC Summary and FL2 to facility. CSW updated patient's son and he is in agreement with [facility] for transport.   2pm-CSW received notification from Vernona Rieger at [facility] that the oxygen has arrived.    Expected Discharge Plan: Assisted Living [facility]) Barriers to Discharge: Continued Medical Work up  Expected Discharge Plan and Services In-house Referral: Clinical Social Work   Post Acute Care Choice: Resumption of Paediatric nurse ([facility]) Living arrangements for the past 2 months: Assisted Living Facility [facility])                                       Social Determinants of Health (SDOH) Interventions SDOH Screenings   Food Insecurity: Patient Unable To Answer (06/25/2023)  Housing: High Risk (06/25/2023)  Transportation Needs: Patient Unable To Answer (06/25/2023)  Utilities: Patient Unable To Answer (06/25/2023)  Tobacco Use: Unknown (06/29/2023)    Readmission Risk Interventions     No data to display

## 2023-06-30 NOTE — NC FL2 (Signed)
[place] MEDICAID FL2 LEVEL OF CARE FORM     IDENTIFICATION  Patient Name: [name] [name] Birthdate: 1941-03-20 Sex: female Admission Date (Current Location): 06/25/2023  [facility] and [place] Number:  Producer, television/film/video and Address:  [facility], [address], [place], [place] [address]      Provider Number: [id number]  Attending Physician Name and Address:  Tyson Alias, *  Relative Name and Phone Number:  Margaretta, Cheff   [phone]    Current Level of Care: Hospital Recommended Level of Care: Memory Care Prior Approval Number:    Date Approved/Denied:   PASRR Number:    Discharge Plan: Other (Comment) (Memory care)    Current Diagnoses: Patient Active Problem List   Diagnosis Date Noted   History of ERCP 06/29/2023   Calculus of bile duct without cholecystitis with obstruction 06/28/2023   Malnutrition of moderate degree 06/28/2023   Abnormal LFTs 06/27/2023   Fall 06/26/2023   Dementia with behavioral disturbance (HCC) 06/26/2023   Laceration of head 06/26/2023   Choledocholithiasis 06/25/2023   Chronic kidney disease, stage III (moderate) (HCC) 08/07/2014   Coronary artery arteriosclerosis 08/07/2014   Obesity 08/07/2014   Sleep apnea, obstructive 08/07/2014   DDD (degenerative disc disease), lumbosacral 06/01/2013    Orientation RESPIRATION BLADDER Height & Weight     Self  O2 (2L nasal cannula) Incontinent Weight: 184 lb 15.5 oz (83.9 kg) Height:  5\' 2"  (157.5 cm)  BEHAVIORAL SYMPTOMS/MOOD NEUROLOGICAL BOWEL NUTRITION STATUS      Incontinent Diet (No added salt)  AMBULATORY STATUS COMMUNICATION OF NEEDS Skin   Extensive Assist Verbally Skin abrasions, Other (Comment) (laceration on face)                       Personal Care Assistance Level of Assistance  Bathing, Feeding, Dressing Bathing Assistance: Limited assistance Feeding assistance: Limited assistance Dressing Assistance: Limited assistance      Functional Limitations Info  Sight, Hearing, Speech Sight Info: Adequate Hearing Info: Impaired Speech Info: Adequate    SPECIAL CARE FACTORS FREQUENCY                       Contractures Contractures Info: Not present    Additional Factors Info  Code Status, Allergies Code Status Info: DNR Allergies Info: Codeine, Darvocet (Propoxyphene N-acetaminophen), Hydrocodone, Morphine, Pravachol, Sulfa Antibiotics, Zocor (Simvastatin - High Dose)           Current Medications (06/30/2023):  This is the current hospital active medication list Current Facility-Administered Medications  Medication Dose Route Frequency Provider Last Rate Last Admin   enoxaparin (LOVENOX) injection 30 mg  30 mg Subcutaneous Q24H Atway, Rayann N, DO   30 mg at 06/30/23 0937   fluticasone furoate-vilanterol (BREO ELLIPTA) 100-25 MCG/ACT 1 puff  1 puff Inhalation Daily Nooruddin, Saad, MD   1 puff at 06/30/23 0841   lactated ringers infusion   Intravenous Continuous Mansouraty, Netty Starring., MD 10 mL/hr at 06/30/23 0937 New Bag at 06/30/23 0937   pantoprazole (PROTONIX) EC tablet 40 mg  40 mg Oral Daily Mansouraty, Netty Starring., MD   40 mg at 06/30/23 0937   QUEtiapine (SEROQUEL) tablet 50 mg  50 mg Oral QHS Nooruddin, Saad, MD   50 mg at 06/29/23 2109   sertraline (ZOLOFT) tablet 100 mg  100 mg Oral Daily Nooruddin, Saad, MD   100 mg at 06/30/23 0937   traZODone (DESYREL) tablet 25 mg  25  mg Oral QHS PRN Morrie Sheldon, MD         Discharge Medications: Please see discharge summary for a list of discharge medications.  Relevant Imaging Results:  Relevant Lab Results:   Additional Information SSN: [id number]. Hospice following at facility.  Mearl Latin, LCSW

## 2023-06-30 NOTE — TOC Transition Note (Signed)
Transition of Care [facility]) - CM/SW Discharge Note   Patient Details  Name: [name] [name] MRN: [id number] Date of Birth: 10/07/1941  [facility]) CM/SW Contact:  Mearl Latin, LCSW Phone Number: 06/30/2023, 2:11 PM   Clinical Narrative:    [facility] Anticipated DC date: 06/30/23 Family notified: [name] [name], [name] [name] Transport by: Sharin Mons   Per MD patient ready for DC to [facility]. RN to call report prior to discharge [phone]). RN, patient, patient's family, and facility notified of DC. Discharge Summary and FL2 sent to facility. DC packet on chart including signed DNR. Oxygen has been delivered to [facility] Ambulance transport requested for patient.   CSW will sign off for now as social work intervention is no longer needed. Please consult [place] again if new needs arise.     Final next level of care: Memory Care Barriers to Discharge: Barriers Resolved   Patient Goals and CMS Choice CMS Medicare.gov Compare Post Acute Care list provided to:: Patient Represenative (must comment) Choice offered to / list presented to : Adult Children ([name] [name] [name] Needle)  Discharge Placement                Patient chooses bed at:  [facility]) Patient to be transferred to facility by: [facility] Name of family member notified: [name] [name], [name] [name] Patient and family notified of of transfer: 06/30/23  Discharge Plan and Services Additional resources added to the After Visit Summary for   In-house Referral: Clinical Social Work   Post Acute Care Choice: Resumption of Svcs/PTA Provider ([facility])                      [facility] Agency: [facility] (Hospice) Date HH Agency Contacted: 06/30/23 Time HH Agency Contacted: 1108 Representative spoke with at [facility] Agency: Marylene Land  Social Determinants of Health (SDOH) Interventions SDOH Screenings   Food Insecurity: Patient Unable To Answer (06/25/2023)  Housing: High Risk  (06/25/2023)  Transportation Needs: Patient Unable To Answer (06/25/2023)  Utilities: Patient Unable To Answer (06/25/2023)  Tobacco Use: Unknown (06/29/2023)     Readmission Risk Interventions     No data to display
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
## 2023-06-30 NOTE — Progress Notes (Signed)
Pt. non-ambulatory, resting O2 sat is 94% on 2 LPM via [place].  06/30/23 1000  Resting  Supplemental oxygen during test? Yes  O2 Flow Rate (L/min) 2 L/min  Type Continuous  Resting Heart Rate 78  Resting Sp02 94

## 2023-06-30 NOTE — Plan of Care (Signed)

## 2023-07-03 ENCOUNTER — Encounter: Payer: Self-pay | Admitting: Gastroenterology

## 2023-07-03 LAB — SURGICAL PATHOLOGY

## 2024-04-26 DIAGNOSIS — G309 Alzheimer's disease, unspecified: Secondary | ICD-10-CM | POA: Diagnosis not present

## 2024-04-26 DIAGNOSIS — N189 Chronic kidney disease, unspecified: Secondary | ICD-10-CM | POA: Diagnosis not present

## 2024-04-26 DIAGNOSIS — M6281 Muscle weakness (generalized): Secondary | ICD-10-CM | POA: Diagnosis not present

## 2024-04-26 DIAGNOSIS — J449 Chronic obstructive pulmonary disease, unspecified: Secondary | ICD-10-CM | POA: Diagnosis not present

## 2024-04-26 DIAGNOSIS — R262 Difficulty in walking, not elsewhere classified: Secondary | ICD-10-CM | POA: Diagnosis not present

## 2024-04-26 DIAGNOSIS — R488 Other symbolic dysfunctions: Secondary | ICD-10-CM | POA: Diagnosis not present

## 2024-04-26 DIAGNOSIS — I679 Cerebrovascular disease, unspecified: Secondary | ICD-10-CM | POA: Diagnosis not present

## 2024-04-29 DIAGNOSIS — N189 Chronic kidney disease, unspecified: Secondary | ICD-10-CM | POA: Diagnosis not present

## 2024-04-29 DIAGNOSIS — I2510 Atherosclerotic heart disease of native coronary artery without angina pectoris: Secondary | ICD-10-CM | POA: Diagnosis not present

## 2024-04-29 DIAGNOSIS — I10 Essential (primary) hypertension: Secondary | ICD-10-CM | POA: Diagnosis not present

## 2024-04-29 DIAGNOSIS — M6281 Muscle weakness (generalized): Secondary | ICD-10-CM | POA: Diagnosis not present

## 2024-04-29 DIAGNOSIS — I509 Heart failure, unspecified: Secondary | ICD-10-CM | POA: Diagnosis not present

## 2024-04-29 DIAGNOSIS — R488 Other symbolic dysfunctions: Secondary | ICD-10-CM | POA: Diagnosis not present

## 2024-04-29 DIAGNOSIS — G309 Alzheimer's disease, unspecified: Secondary | ICD-10-CM | POA: Diagnosis not present

## 2024-04-29 DIAGNOSIS — J449 Chronic obstructive pulmonary disease, unspecified: Secondary | ICD-10-CM | POA: Diagnosis not present

## 2024-04-29 DIAGNOSIS — R262 Difficulty in walking, not elsewhere classified: Secondary | ICD-10-CM | POA: Diagnosis not present

## 2024-04-29 DIAGNOSIS — I679 Cerebrovascular disease, unspecified: Secondary | ICD-10-CM | POA: Diagnosis not present

## 2024-04-30 DIAGNOSIS — J449 Chronic obstructive pulmonary disease, unspecified: Secondary | ICD-10-CM | POA: Diagnosis not present

## 2024-04-30 DIAGNOSIS — G309 Alzheimer's disease, unspecified: Secondary | ICD-10-CM | POA: Diagnosis not present

## 2024-04-30 DIAGNOSIS — E039 Hypothyroidism, unspecified: Secondary | ICD-10-CM | POA: Diagnosis not present

## 2024-04-30 DIAGNOSIS — M6281 Muscle weakness (generalized): Secondary | ICD-10-CM | POA: Diagnosis not present

## 2024-04-30 DIAGNOSIS — R488 Other symbolic dysfunctions: Secondary | ICD-10-CM | POA: Diagnosis not present

## 2024-04-30 DIAGNOSIS — R262 Difficulty in walking, not elsewhere classified: Secondary | ICD-10-CM | POA: Diagnosis not present

## 2024-04-30 DIAGNOSIS — N189 Chronic kidney disease, unspecified: Secondary | ICD-10-CM | POA: Diagnosis not present

## 2024-04-30 DIAGNOSIS — I679 Cerebrovascular disease, unspecified: Secondary | ICD-10-CM | POA: Diagnosis not present

## 2024-05-01 DIAGNOSIS — R262 Difficulty in walking, not elsewhere classified: Secondary | ICD-10-CM | POA: Diagnosis not present

## 2024-05-01 DIAGNOSIS — J449 Chronic obstructive pulmonary disease, unspecified: Secondary | ICD-10-CM | POA: Diagnosis not present

## 2024-05-01 DIAGNOSIS — G309 Alzheimer's disease, unspecified: Secondary | ICD-10-CM | POA: Diagnosis not present

## 2024-05-01 DIAGNOSIS — R488 Other symbolic dysfunctions: Secondary | ICD-10-CM | POA: Diagnosis not present

## 2024-05-01 DIAGNOSIS — M6281 Muscle weakness (generalized): Secondary | ICD-10-CM | POA: Diagnosis not present

## 2024-05-01 DIAGNOSIS — N189 Chronic kidney disease, unspecified: Secondary | ICD-10-CM | POA: Diagnosis not present

## 2024-05-01 DIAGNOSIS — I679 Cerebrovascular disease, unspecified: Secondary | ICD-10-CM | POA: Diagnosis not present

## 2024-05-02 DIAGNOSIS — M6281 Muscle weakness (generalized): Secondary | ICD-10-CM | POA: Diagnosis not present

## 2024-05-02 DIAGNOSIS — R488 Other symbolic dysfunctions: Secondary | ICD-10-CM | POA: Diagnosis not present

## 2024-05-02 DIAGNOSIS — R262 Difficulty in walking, not elsewhere classified: Secondary | ICD-10-CM | POA: Diagnosis not present

## 2024-05-02 DIAGNOSIS — I679 Cerebrovascular disease, unspecified: Secondary | ICD-10-CM | POA: Diagnosis not present

## 2024-05-02 DIAGNOSIS — N189 Chronic kidney disease, unspecified: Secondary | ICD-10-CM | POA: Diagnosis not present

## 2024-05-02 DIAGNOSIS — G309 Alzheimer's disease, unspecified: Secondary | ICD-10-CM | POA: Diagnosis not present

## 2024-05-02 DIAGNOSIS — J449 Chronic obstructive pulmonary disease, unspecified: Secondary | ICD-10-CM | POA: Diagnosis not present

## 2024-05-03 DIAGNOSIS — J449 Chronic obstructive pulmonary disease, unspecified: Secondary | ICD-10-CM | POA: Diagnosis not present

## 2024-05-03 DIAGNOSIS — I679 Cerebrovascular disease, unspecified: Secondary | ICD-10-CM | POA: Diagnosis not present

## 2024-05-03 DIAGNOSIS — R488 Other symbolic dysfunctions: Secondary | ICD-10-CM | POA: Diagnosis not present

## 2024-05-03 DIAGNOSIS — G309 Alzheimer's disease, unspecified: Secondary | ICD-10-CM | POA: Diagnosis not present

## 2024-05-03 DIAGNOSIS — N189 Chronic kidney disease, unspecified: Secondary | ICD-10-CM | POA: Diagnosis not present

## 2024-05-03 DIAGNOSIS — M6281 Muscle weakness (generalized): Secondary | ICD-10-CM | POA: Diagnosis not present

## 2024-05-03 DIAGNOSIS — R262 Difficulty in walking, not elsewhere classified: Secondary | ICD-10-CM | POA: Diagnosis not present

## 2024-05-06 DIAGNOSIS — N189 Chronic kidney disease, unspecified: Secondary | ICD-10-CM | POA: Diagnosis not present

## 2024-05-06 DIAGNOSIS — I679 Cerebrovascular disease, unspecified: Secondary | ICD-10-CM | POA: Diagnosis not present

## 2024-05-06 DIAGNOSIS — R488 Other symbolic dysfunctions: Secondary | ICD-10-CM | POA: Diagnosis not present

## 2024-05-06 DIAGNOSIS — J449 Chronic obstructive pulmonary disease, unspecified: Secondary | ICD-10-CM | POA: Diagnosis not present

## 2024-05-06 DIAGNOSIS — G309 Alzheimer's disease, unspecified: Secondary | ICD-10-CM | POA: Diagnosis not present

## 2024-05-06 DIAGNOSIS — R262 Difficulty in walking, not elsewhere classified: Secondary | ICD-10-CM | POA: Diagnosis not present

## 2024-05-06 DIAGNOSIS — M6281 Muscle weakness (generalized): Secondary | ICD-10-CM | POA: Diagnosis not present

## 2024-05-06 DIAGNOSIS — I10 Essential (primary) hypertension: Secondary | ICD-10-CM | POA: Diagnosis not present

## 2024-05-06 DIAGNOSIS — Z7189 Other specified counseling: Secondary | ICD-10-CM | POA: Diagnosis not present

## 2024-05-07 DIAGNOSIS — R488 Other symbolic dysfunctions: Secondary | ICD-10-CM | POA: Diagnosis not present

## 2024-05-07 DIAGNOSIS — I679 Cerebrovascular disease, unspecified: Secondary | ICD-10-CM | POA: Diagnosis not present

## 2024-05-07 DIAGNOSIS — R262 Difficulty in walking, not elsewhere classified: Secondary | ICD-10-CM | POA: Diagnosis not present

## 2024-05-07 DIAGNOSIS — J449 Chronic obstructive pulmonary disease, unspecified: Secondary | ICD-10-CM | POA: Diagnosis not present

## 2024-05-07 DIAGNOSIS — N189 Chronic kidney disease, unspecified: Secondary | ICD-10-CM | POA: Diagnosis not present

## 2024-05-07 DIAGNOSIS — M6281 Muscle weakness (generalized): Secondary | ICD-10-CM | POA: Diagnosis not present

## 2024-05-07 DIAGNOSIS — G309 Alzheimer's disease, unspecified: Secondary | ICD-10-CM | POA: Diagnosis not present

## 2024-05-08 DIAGNOSIS — N189 Chronic kidney disease, unspecified: Secondary | ICD-10-CM | POA: Diagnosis not present

## 2024-05-08 DIAGNOSIS — M6281 Muscle weakness (generalized): Secondary | ICD-10-CM | POA: Diagnosis not present

## 2024-05-08 DIAGNOSIS — R488 Other symbolic dysfunctions: Secondary | ICD-10-CM | POA: Diagnosis not present

## 2024-05-08 DIAGNOSIS — G309 Alzheimer's disease, unspecified: Secondary | ICD-10-CM | POA: Diagnosis not present

## 2024-05-08 DIAGNOSIS — R262 Difficulty in walking, not elsewhere classified: Secondary | ICD-10-CM | POA: Diagnosis not present

## 2024-05-08 DIAGNOSIS — I679 Cerebrovascular disease, unspecified: Secondary | ICD-10-CM | POA: Diagnosis not present

## 2024-05-08 DIAGNOSIS — J449 Chronic obstructive pulmonary disease, unspecified: Secondary | ICD-10-CM | POA: Diagnosis not present

## 2024-05-09 DIAGNOSIS — G309 Alzheimer's disease, unspecified: Secondary | ICD-10-CM | POA: Diagnosis not present

## 2024-05-09 DIAGNOSIS — N189 Chronic kidney disease, unspecified: Secondary | ICD-10-CM | POA: Diagnosis not present

## 2024-05-09 DIAGNOSIS — R262 Difficulty in walking, not elsewhere classified: Secondary | ICD-10-CM | POA: Diagnosis not present

## 2024-05-09 DIAGNOSIS — I679 Cerebrovascular disease, unspecified: Secondary | ICD-10-CM | POA: Diagnosis not present

## 2024-05-09 DIAGNOSIS — J449 Chronic obstructive pulmonary disease, unspecified: Secondary | ICD-10-CM | POA: Diagnosis not present

## 2024-05-09 DIAGNOSIS — R488 Other symbolic dysfunctions: Secondary | ICD-10-CM | POA: Diagnosis not present

## 2024-05-09 DIAGNOSIS — M6281 Muscle weakness (generalized): Secondary | ICD-10-CM | POA: Diagnosis not present

## 2024-05-10 DIAGNOSIS — I679 Cerebrovascular disease, unspecified: Secondary | ICD-10-CM | POA: Diagnosis not present

## 2024-05-10 DIAGNOSIS — M6281 Muscle weakness (generalized): Secondary | ICD-10-CM | POA: Diagnosis not present

## 2024-05-10 DIAGNOSIS — N189 Chronic kidney disease, unspecified: Secondary | ICD-10-CM | POA: Diagnosis not present

## 2024-05-10 DIAGNOSIS — R488 Other symbolic dysfunctions: Secondary | ICD-10-CM | POA: Diagnosis not present

## 2024-05-10 DIAGNOSIS — G309 Alzheimer's disease, unspecified: Secondary | ICD-10-CM | POA: Diagnosis not present

## 2024-05-10 DIAGNOSIS — J449 Chronic obstructive pulmonary disease, unspecified: Secondary | ICD-10-CM | POA: Diagnosis not present

## 2024-05-10 DIAGNOSIS — R262 Difficulty in walking, not elsewhere classified: Secondary | ICD-10-CM | POA: Diagnosis not present

## 2024-05-13 DIAGNOSIS — G309 Alzheimer's disease, unspecified: Secondary | ICD-10-CM | POA: Diagnosis not present

## 2024-05-13 DIAGNOSIS — I679 Cerebrovascular disease, unspecified: Secondary | ICD-10-CM | POA: Diagnosis not present

## 2024-05-13 DIAGNOSIS — R262 Difficulty in walking, not elsewhere classified: Secondary | ICD-10-CM | POA: Diagnosis not present

## 2024-05-13 DIAGNOSIS — J449 Chronic obstructive pulmonary disease, unspecified: Secondary | ICD-10-CM | POA: Diagnosis not present

## 2024-05-13 DIAGNOSIS — M6281 Muscle weakness (generalized): Secondary | ICD-10-CM | POA: Diagnosis not present

## 2024-05-13 DIAGNOSIS — R488 Other symbolic dysfunctions: Secondary | ICD-10-CM | POA: Diagnosis not present

## 2024-05-13 DIAGNOSIS — N189 Chronic kidney disease, unspecified: Secondary | ICD-10-CM | POA: Diagnosis not present

## 2024-05-14 DIAGNOSIS — J449 Chronic obstructive pulmonary disease, unspecified: Secondary | ICD-10-CM | POA: Diagnosis not present

## 2024-05-14 DIAGNOSIS — I679 Cerebrovascular disease, unspecified: Secondary | ICD-10-CM | POA: Diagnosis not present

## 2024-05-14 DIAGNOSIS — G309 Alzheimer's disease, unspecified: Secondary | ICD-10-CM | POA: Diagnosis not present

## 2024-05-14 DIAGNOSIS — N189 Chronic kidney disease, unspecified: Secondary | ICD-10-CM | POA: Diagnosis not present

## 2024-05-14 DIAGNOSIS — R488 Other symbolic dysfunctions: Secondary | ICD-10-CM | POA: Diagnosis not present

## 2024-05-14 DIAGNOSIS — M6281 Muscle weakness (generalized): Secondary | ICD-10-CM | POA: Diagnosis not present

## 2024-05-14 DIAGNOSIS — R262 Difficulty in walking, not elsewhere classified: Secondary | ICD-10-CM | POA: Diagnosis not present

## 2024-05-15 DIAGNOSIS — R488 Other symbolic dysfunctions: Secondary | ICD-10-CM | POA: Diagnosis not present

## 2024-05-15 DIAGNOSIS — N189 Chronic kidney disease, unspecified: Secondary | ICD-10-CM | POA: Diagnosis not present

## 2024-05-15 DIAGNOSIS — J449 Chronic obstructive pulmonary disease, unspecified: Secondary | ICD-10-CM | POA: Diagnosis not present

## 2024-05-15 DIAGNOSIS — M6281 Muscle weakness (generalized): Secondary | ICD-10-CM | POA: Diagnosis not present

## 2024-05-15 DIAGNOSIS — G309 Alzheimer's disease, unspecified: Secondary | ICD-10-CM | POA: Diagnosis not present

## 2024-05-15 DIAGNOSIS — I679 Cerebrovascular disease, unspecified: Secondary | ICD-10-CM | POA: Diagnosis not present

## 2024-05-15 DIAGNOSIS — R262 Difficulty in walking, not elsewhere classified: Secondary | ICD-10-CM | POA: Diagnosis not present

## 2024-05-16 DIAGNOSIS — G309 Alzheimer's disease, unspecified: Secondary | ICD-10-CM | POA: Diagnosis not present

## 2024-05-16 DIAGNOSIS — R262 Difficulty in walking, not elsewhere classified: Secondary | ICD-10-CM | POA: Diagnosis not present

## 2024-05-16 DIAGNOSIS — N189 Chronic kidney disease, unspecified: Secondary | ICD-10-CM | POA: Diagnosis not present

## 2024-05-16 DIAGNOSIS — J449 Chronic obstructive pulmonary disease, unspecified: Secondary | ICD-10-CM | POA: Diagnosis not present

## 2024-05-16 DIAGNOSIS — R488 Other symbolic dysfunctions: Secondary | ICD-10-CM | POA: Diagnosis not present

## 2024-05-16 DIAGNOSIS — I679 Cerebrovascular disease, unspecified: Secondary | ICD-10-CM | POA: Diagnosis not present

## 2024-05-16 DIAGNOSIS — M6281 Muscle weakness (generalized): Secondary | ICD-10-CM | POA: Diagnosis not present

## 2024-05-17 DIAGNOSIS — N189 Chronic kidney disease, unspecified: Secondary | ICD-10-CM | POA: Diagnosis not present

## 2024-05-17 DIAGNOSIS — R488 Other symbolic dysfunctions: Secondary | ICD-10-CM | POA: Diagnosis not present

## 2024-05-17 DIAGNOSIS — J449 Chronic obstructive pulmonary disease, unspecified: Secondary | ICD-10-CM | POA: Diagnosis not present

## 2024-05-17 DIAGNOSIS — I679 Cerebrovascular disease, unspecified: Secondary | ICD-10-CM | POA: Diagnosis not present

## 2024-05-17 DIAGNOSIS — R262 Difficulty in walking, not elsewhere classified: Secondary | ICD-10-CM | POA: Diagnosis not present

## 2024-05-17 DIAGNOSIS — G309 Alzheimer's disease, unspecified: Secondary | ICD-10-CM | POA: Diagnosis not present

## 2024-05-17 DIAGNOSIS — M6281 Muscle weakness (generalized): Secondary | ICD-10-CM | POA: Diagnosis not present

## 2024-05-20 DIAGNOSIS — R488 Other symbolic dysfunctions: Secondary | ICD-10-CM | POA: Diagnosis not present

## 2024-05-20 DIAGNOSIS — J449 Chronic obstructive pulmonary disease, unspecified: Secondary | ICD-10-CM | POA: Diagnosis not present

## 2024-05-20 DIAGNOSIS — I679 Cerebrovascular disease, unspecified: Secondary | ICD-10-CM | POA: Diagnosis not present

## 2024-05-20 DIAGNOSIS — R262 Difficulty in walking, not elsewhere classified: Secondary | ICD-10-CM | POA: Diagnosis not present

## 2024-05-20 DIAGNOSIS — G309 Alzheimer's disease, unspecified: Secondary | ICD-10-CM | POA: Diagnosis not present

## 2024-05-20 DIAGNOSIS — N189 Chronic kidney disease, unspecified: Secondary | ICD-10-CM | POA: Diagnosis not present

## 2024-05-20 DIAGNOSIS — M6281 Muscle weakness (generalized): Secondary | ICD-10-CM | POA: Diagnosis not present

## 2024-05-21 DIAGNOSIS — N189 Chronic kidney disease, unspecified: Secondary | ICD-10-CM | POA: Diagnosis not present

## 2024-05-21 DIAGNOSIS — G309 Alzheimer's disease, unspecified: Secondary | ICD-10-CM | POA: Diagnosis not present

## 2024-05-21 DIAGNOSIS — R488 Other symbolic dysfunctions: Secondary | ICD-10-CM | POA: Diagnosis not present

## 2024-05-21 DIAGNOSIS — J449 Chronic obstructive pulmonary disease, unspecified: Secondary | ICD-10-CM | POA: Diagnosis not present

## 2024-05-21 DIAGNOSIS — M6281 Muscle weakness (generalized): Secondary | ICD-10-CM | POA: Diagnosis not present

## 2024-05-21 DIAGNOSIS — I679 Cerebrovascular disease, unspecified: Secondary | ICD-10-CM | POA: Diagnosis not present

## 2024-05-21 DIAGNOSIS — R262 Difficulty in walking, not elsewhere classified: Secondary | ICD-10-CM | POA: Diagnosis not present

## 2024-05-22 DIAGNOSIS — R262 Difficulty in walking, not elsewhere classified: Secondary | ICD-10-CM | POA: Diagnosis not present

## 2024-05-22 DIAGNOSIS — I679 Cerebrovascular disease, unspecified: Secondary | ICD-10-CM | POA: Diagnosis not present

## 2024-05-22 DIAGNOSIS — G309 Alzheimer's disease, unspecified: Secondary | ICD-10-CM | POA: Diagnosis not present

## 2024-05-22 DIAGNOSIS — R488 Other symbolic dysfunctions: Secondary | ICD-10-CM | POA: Diagnosis not present

## 2024-05-22 DIAGNOSIS — M6281 Muscle weakness (generalized): Secondary | ICD-10-CM | POA: Diagnosis not present

## 2024-05-22 DIAGNOSIS — J449 Chronic obstructive pulmonary disease, unspecified: Secondary | ICD-10-CM | POA: Diagnosis not present

## 2024-05-22 DIAGNOSIS — N189 Chronic kidney disease, unspecified: Secondary | ICD-10-CM | POA: Diagnosis not present

## 2024-05-23 DIAGNOSIS — N189 Chronic kidney disease, unspecified: Secondary | ICD-10-CM | POA: Diagnosis not present

## 2024-05-23 DIAGNOSIS — R488 Other symbolic dysfunctions: Secondary | ICD-10-CM | POA: Diagnosis not present

## 2024-05-23 DIAGNOSIS — R262 Difficulty in walking, not elsewhere classified: Secondary | ICD-10-CM | POA: Diagnosis not present

## 2024-05-23 DIAGNOSIS — J449 Chronic obstructive pulmonary disease, unspecified: Secondary | ICD-10-CM | POA: Diagnosis not present

## 2024-05-23 DIAGNOSIS — I679 Cerebrovascular disease, unspecified: Secondary | ICD-10-CM | POA: Diagnosis not present

## 2024-05-23 DIAGNOSIS — G309 Alzheimer's disease, unspecified: Secondary | ICD-10-CM | POA: Diagnosis not present

## 2024-05-23 DIAGNOSIS — M6281 Muscle weakness (generalized): Secondary | ICD-10-CM | POA: Diagnosis not present

## 2024-06-05 DIAGNOSIS — J449 Chronic obstructive pulmonary disease, unspecified: Secondary | ICD-10-CM | POA: Diagnosis not present

## 2024-06-05 DIAGNOSIS — N189 Chronic kidney disease, unspecified: Secondary | ICD-10-CM | POA: Diagnosis not present

## 2024-06-05 DIAGNOSIS — E039 Hypothyroidism, unspecified: Secondary | ICD-10-CM | POA: Diagnosis not present

## 2024-06-05 DIAGNOSIS — G309 Alzheimer's disease, unspecified: Secondary | ICD-10-CM | POA: Diagnosis not present

## 2024-06-12 DIAGNOSIS — G309 Alzheimer's disease, unspecified: Secondary | ICD-10-CM | POA: Diagnosis not present

## 2024-06-12 DIAGNOSIS — R946 Abnormal results of thyroid function studies: Secondary | ICD-10-CM | POA: Diagnosis not present

## 2024-06-12 DIAGNOSIS — R410 Disorientation, unspecified: Secondary | ICD-10-CM | POA: Diagnosis not present

## 2024-07-01 DIAGNOSIS — E039 Hypothyroidism, unspecified: Secondary | ICD-10-CM | POA: Diagnosis not present

## 2024-07-01 DIAGNOSIS — N189 Chronic kidney disease, unspecified: Secondary | ICD-10-CM | POA: Diagnosis not present

## 2024-07-01 DIAGNOSIS — F5101 Primary insomnia: Secondary | ICD-10-CM | POA: Diagnosis not present

## 2024-07-01 DIAGNOSIS — I10 Essential (primary) hypertension: Secondary | ICD-10-CM | POA: Diagnosis not present

## 2024-07-01 DIAGNOSIS — I119 Hypertensive heart disease without heart failure: Secondary | ICD-10-CM | POA: Diagnosis not present

## 2024-07-01 DIAGNOSIS — I509 Heart failure, unspecified: Secondary | ICD-10-CM | POA: Diagnosis not present

## 2024-07-01 DIAGNOSIS — G309 Alzheimer's disease, unspecified: Secondary | ICD-10-CM | POA: Diagnosis not present

## 2024-07-01 DIAGNOSIS — J449 Chronic obstructive pulmonary disease, unspecified: Secondary | ICD-10-CM | POA: Diagnosis not present

## 2024-07-11 DIAGNOSIS — N189 Chronic kidney disease, unspecified: Secondary | ICD-10-CM | POA: Diagnosis not present

## 2024-07-11 DIAGNOSIS — J449 Chronic obstructive pulmonary disease, unspecified: Secondary | ICD-10-CM | POA: Diagnosis not present

## 2024-07-11 DIAGNOSIS — G309 Alzheimer's disease, unspecified: Secondary | ICD-10-CM | POA: Diagnosis not present
